# Patient Record
Sex: Female | Born: 1990 | Hispanic: Yes | Marital: Married | Smoking: Never smoker
Health system: Southern US, Community
[De-identification: ages and names within clinical notes are randomized; demographics above are authoritative.]

## PROBLEM LIST (undated history)

## (undated) ENCOUNTER — Inpatient Hospital Stay (HOSPITAL_COMMUNITY): Payer: Self-pay

## (undated) DIAGNOSIS — N83209 Unspecified ovarian cyst, unspecified side: Secondary | ICD-10-CM

## (undated) DIAGNOSIS — N200 Calculus of kidney: Secondary | ICD-10-CM

---
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
## 2010-07-13 LAB — URINE MICROSCOPIC-ADD ON

## 2010-07-13 LAB — URINALYSIS, ROUTINE W REFLEX MICROSCOPIC
Glucose, UA: NEGATIVE mg/dL
Protein, ur: NEGATIVE mg/dL
pH: 5.5 (ref 5.0–8.0)

## 2010-07-13 LAB — CBC
Hemoglobin: 12.3 g/dL (ref 12.0–15.0)
MCHC: 31.6 g/dL (ref 30.0–36.0)
RBC: 5.14 MIL/uL — ABNORMAL HIGH (ref 3.87–5.11)
WBC: 7.6 10*3/uL (ref 4.0–10.5)

## 2010-07-13 LAB — GC/CHLAMYDIA PROBE AMP, GENITAL: GC Probe Amp, Genital: NEGATIVE

## 2010-07-13 LAB — POCT PREGNANCY, URINE: Preg Test, Ur: NEGATIVE

## 2010-07-13 LAB — WET PREP, GENITAL: Clue Cells Wet Prep HPF POC: NONE SEEN

## 2010-11-25 ENCOUNTER — Emergency Department (HOSPITAL_COMMUNITY)
Admission: EM | Admit: 2010-11-25 | Discharge: 2010-11-26 | Disposition: A | Discharge status: Home / Self Care | Payer: Self-pay | Attending: Emergency Medicine | Admitting: Emergency Medicine

## 2010-11-25 DIAGNOSIS — R109 Unspecified abdominal pain: Secondary | ICD-10-CM | POA: Insufficient documentation

## 2010-11-25 DIAGNOSIS — R10814 Left lower quadrant abdominal tenderness: Secondary | ICD-10-CM | POA: Insufficient documentation

## 2010-11-25 DIAGNOSIS — N949 Unspecified condition associated with female genital organs and menstrual cycle: Secondary | ICD-10-CM | POA: Insufficient documentation

## 2010-11-25 LAB — URINALYSIS, ROUTINE W REFLEX MICROSCOPIC
Bilirubin Urine: NEGATIVE
Ketones, ur: NEGATIVE mg/dL
Nitrite: NEGATIVE
Protein, ur: NEGATIVE mg/dL
Specific Gravity, Urine: 1.027 (ref 1.005–1.030)
Urobilinogen, UA: 0.2 mg/dL (ref 0.0–1.0)

## 2010-11-26 ENCOUNTER — Emergency Department (HOSPITAL_COMMUNITY): Payer: Self-pay

## 2010-11-26 LAB — PREGNANCY, URINE: Preg Test, Ur: NEGATIVE

## 2010-11-26 LAB — DIFFERENTIAL
Basophils Absolute: 0 10*3/uL (ref 0.0–0.1)
Basophils Relative: 0 % (ref 0–1)
Eosinophils Absolute: 0.4 10*3/uL (ref 0.0–0.7)
Eosinophils Relative: 4 % (ref 0–5)
Monocytes Absolute: 0.7 10*3/uL (ref 0.1–1.0)
Monocytes Relative: 7 % (ref 3–12)

## 2010-11-26 LAB — COMPREHENSIVE METABOLIC PANEL
ALT: 15 U/L (ref 0–35)
AST: 19 U/L (ref 0–37)
Calcium: 9.3 mg/dL (ref 8.4–10.5)
Creatinine, Ser: 0.62 mg/dL (ref 0.50–1.10)
GFR calc Af Amer: >60 mL/min (ref >60)
GFR calc non Af Amer: >60 mL/min (ref >60)
Glucose, Bld: 84 mg/dL (ref 70–99)
Sodium: 139 mEq/L (ref 135–145)
Total Protein: 8 g/dL (ref 6.0–8.3)

## 2010-11-26 LAB — CBC
MCH: 27.3 pg (ref 26.0–34.0)
MCHC: 34 g/dL (ref 30.0–36.0)
RDW: 14.6 % (ref 11.5–15.5)

## 2010-11-26 LAB — WET PREP, GENITAL
Clue Cells Wet Prep HPF POC: NONE SEEN
Trich, Wet Prep: NONE SEEN
Yeast Wet Prep HPF POC: NONE SEEN

## 2010-11-27 ENCOUNTER — Emergency Department (HOSPITAL_COMMUNITY)
Admission: EM | Admit: 2010-11-27 | Discharge: 2010-11-27 | Disposition: A | Discharge status: Home / Self Care | Payer: Self-pay | Attending: Emergency Medicine | Admitting: Emergency Medicine

## 2010-11-27 DIAGNOSIS — R110 Nausea: Secondary | ICD-10-CM | POA: Insufficient documentation

## 2010-11-27 DIAGNOSIS — R51 Headache: Secondary | ICD-10-CM | POA: Insufficient documentation

## 2010-11-27 DIAGNOSIS — R42 Dizziness and giddiness: Secondary | ICD-10-CM | POA: Insufficient documentation

## 2010-11-27 DIAGNOSIS — H53149 Visual discomfort, unspecified: Secondary | ICD-10-CM | POA: Insufficient documentation

## 2010-11-28 LAB — GC/CHLAMYDIA PROBE AMP, GENITAL: Chlamydia, DNA Probe: NEGATIVE

## 2011-04-27 ENCOUNTER — Encounter: Payer: Self-pay | Admitting: Emergency Medicine

## 2011-04-27 ENCOUNTER — Emergency Department (HOSPITAL_COMMUNITY)
Admission: EM | Admit: 2011-04-27 | Discharge: 2011-04-28 | Discharge status: Against Medical Advice | Payer: Self-pay | Attending: Emergency Medicine | Admitting: Emergency Medicine

## 2011-04-27 DIAGNOSIS — Z0389 Encounter for observation for other suspected diseases and conditions ruled out: Secondary | ICD-10-CM | POA: Insufficient documentation

## 2011-04-27 NOTE — ED Notes (Signed)
Pt states she had vaginal bleeding from 03/25/11-04/25/11.  Pt has implant x 3 years.

## 2011-04-27 NOTE — ED Notes (Signed)
C/o headache and generalized weakness x 3 days.  Also c/o pain across upper chest and back.  Denies cough.

## 2011-05-07 ENCOUNTER — Other Ambulatory Visit: Payer: Self-pay | Admitting: Family Medicine

## 2011-05-07 DIAGNOSIS — N63 Unspecified lump in unspecified breast: Secondary | ICD-10-CM

## 2011-05-09 ENCOUNTER — Inpatient Hospital Stay: Admission: RE | Admit: 2011-05-09 | Payer: Self-pay | Source: Ambulatory Visit

## 2011-09-19 ENCOUNTER — Emergency Department (HOSPITAL_COMMUNITY)
Admission: EM | Admit: 2011-09-19 | Discharge: 2011-09-19 | Disposition: A | Discharge status: Home / Self Care | Payer: Self-pay | Attending: Emergency Medicine | Admitting: Emergency Medicine

## 2011-09-19 ENCOUNTER — Encounter (HOSPITAL_COMMUNITY): Payer: Self-pay | Admitting: *Deleted

## 2011-09-19 DIAGNOSIS — G43909 Migraine, unspecified, not intractable, without status migrainosus: Secondary | ICD-10-CM | POA: Insufficient documentation

## 2011-09-19 MED ORDER — PROCHLORPERAZINE EDISYLATE 5 MG/ML IJ SOLN
10.0000 mg | Freq: Once | INTRAMUSCULAR | Status: AC
  Administered 2011-09-19: 10 mg via INTRAVENOUS
  Filled 2011-09-19: qty 2

## 2011-09-19 MED ORDER — KETOROLAC TROMETHAMINE 30 MG/ML IJ SOLN
30.0000 mg | Freq: Once | INTRAMUSCULAR | Status: AC
  Administered 2011-09-19: 30 mg via INTRAVENOUS
  Filled 2011-09-19: qty 1

## 2011-09-19 MED ORDER — DIPHENHYDRAMINE HCL 50 MG/ML IJ SOLN
25.0000 mg | Freq: Once | INTRAMUSCULAR | Status: AC
  Administered 2011-09-19: 25 mg via INTRAVENOUS
  Filled 2011-09-19: qty 1

## 2011-09-19 MED ORDER — DEXAMETHASONE SODIUM PHOSPHATE 10 MG/ML IJ SOLN
10.0000 mg | Freq: Once | INTRAMUSCULAR | Status: AC
  Administered 2011-09-19: 10 mg via INTRAVENOUS
  Filled 2011-09-19: qty 1

## 2011-09-19 MED ORDER — SODIUM CHLORIDE 0.9 % IV BOLUS (SEPSIS)
1000.0000 mL | Freq: Once | INTRAVENOUS | Status: AC
  Administered 2011-09-19: 1000 mL via INTRAVENOUS

## 2011-09-19 NOTE — ED Provider Notes (Signed)
History     CSN: [id number]  Arrival date & time 09/19/11  4098   First MD Initiated Contact with Patient 09/19/11 2142      Chief Complaint  Patient presents with  . Migraine    (Consider location/radiation/quality/duration/timing/severity/associated sxs/prior treatment) HPI Comments: Hx of daily migraines for years.  Headache today similar to prior.  Gradual onset of symptoms.  Has had prior negative neuroimaging.  No PCP.  Patient is a 21 y.o. female presenting with migraine. The history is provided by the patient.  Migraine This is a recurrent problem. The current episode started today. The problem occurs constantly. The problem has been gradually worsening. Pertinent negatives include no abdominal pain, chest pain, congestion, fever or rash. Exacerbated by: photophobia. Treatments tried: motrin.    Past Medical History  Diagnosis Date  . Migraine     No past surgical history on file.  No family history on file.  History  Substance Use Topics  . Smoking status: Never Smoker   . Smokeless tobacco: Not on file  . Alcohol Use: No    OB History    Grav Para Term Preterm Abortions TAB SAB Ect Mult Living                  Review of Systems  Constitutional: Negative for fever and activity change.  HENT: Negative for congestion.   Eyes: Negative for visual disturbance.  Respiratory: Negative for chest tightness and shortness of breath.   Cardiovascular: Negative for chest pain and leg swelling.  Gastrointestinal: Negative for abdominal pain.  Genitourinary: Negative for dysuria.  Skin: Negative for rash.  Neurological: Negative for syncope.  Psychiatric/Behavioral: Negative for behavioral problems.    Allergies  Review of patient's allergies indicates no known allergies.  Home Medications   Current Outpatient Rx  Name Route Sig Dispense Refill  . ADVIL MIGRAINE PO Oral Take 2 tablets by mouth 3 (three) times daily as needed. For pain      BP 98/63   Pulse 84  Temp(Src) 97.1 F (36.2 C) (Oral)  Resp 15  SpO2 97%  Physical Exam  Constitutional: She is oriented to person, place, and time. She appears well-developed and well-nourished.  HENT:  Head: Normocephalic and atraumatic.  Eyes: Conjunctivae and EOM are normal. Pupils are equal, round, and reactive to light. No scleral icterus.  Neck: Normal range of motion. Neck supple.  Cardiovascular: Normal rate and regular rhythm.  Exam reveals no gallop and no friction rub.   No murmur heard. Pulmonary/Chest: Effort normal and breath sounds normal. No respiratory distress. She has no wheezes. She has no rales. She exhibits no tenderness.  Abdominal: Soft. She exhibits no distension and no mass. There is no tenderness. There is no rebound and no guarding.  Musculoskeletal: Normal range of motion.  Neurological: She is alert and oriented to person, place, and time. No cranial nerve deficit. She exhibits normal muscle tone. Coordination normal.       5/5 strength in all extremities.  Skin: Skin is warm and dry. No rash noted.  Psychiatric: She has a normal mood and affect. Her behavior is normal. Judgment and thought content normal.    ED Course  Procedures (including critical care time)  Labs Reviewed - No data to display No results found.   1. Migraine       MDM  Hx of daily migraines for years.  Headache today similar to prior.  Gradual onset of symptoms.  Has had prior negative neuroimaging.  No PCP.  VSS.  Normal neuro exam.  Feeling much better after migraine cocktail.  Gave neuro f/u for chronic migraines.  Pt comfortable with plan and will follow up.         Army Chaco, MD 09/19/11 2243

## 2011-09-19 NOTE — ED Notes (Signed)
Pt has hx of migraines.  For last 2 weeks she has been experiencing headaches and today her headache became unbearable.  Pt is experiencing blurry vision, photophobic.

## 2011-09-19 NOTE — ED Notes (Signed)
Pt c/o migraine h/a over past 2 wks. Pt states she has been getting a migraine almost every day. Pt having inc sensitivity to light. Pt c/o nausea but denies vomiting. Pt in room with lights off and ice pack on forehead. Pt a&o x 4 with no neuro deficits.

## 2011-09-19 NOTE — ED Notes (Signed)
Pt d/c home in NAD. Pt voiced understanding of d/c instructions and follow up care. Pt ambulated with quick, steady gait and voiced appreciation to staff.

## 2011-09-19 NOTE — ED Notes (Signed)
Ice bag given per pt. request 

## 2011-09-19 NOTE — ED Notes (Addendum)
Pt up to desk inquiring about wait times; states h/a is "unbearable"; pt tearful, interm pacing floor - apologized for wait times; family remains at pt's side

## 2011-09-19 NOTE — Discharge Instructions (Signed)

## 2011-09-20 NOTE — ED Provider Notes (Signed)
I saw and evaluated the patient, reviewed the resident's note and I agree with the findings and plan.  Gradual onset headache similar to previous migraines. No focal deficits.   Glynn Octave, MD 09/20/11 [phone]

## 2011-11-09 DIAGNOSIS — G43009 Migraine without aura, not intractable, without status migrainosus: Secondary | ICD-10-CM | POA: Insufficient documentation

## 2011-11-09 DIAGNOSIS — G43711 Chronic migraine without aura, intractable, with status migrainosus: Secondary | ICD-10-CM | POA: Insufficient documentation

## 2012-01-30 ENCOUNTER — Encounter (HOSPITAL_COMMUNITY): Payer: Self-pay | Admitting: Emergency Medicine

## 2012-01-30 ENCOUNTER — Emergency Department (HOSPITAL_COMMUNITY)
Admission: EM | Admit: 2012-01-30 | Discharge: 2012-01-30 | Disposition: A | Discharge status: Home / Self Care | Payer: Self-pay | Attending: Emergency Medicine | Admitting: Emergency Medicine

## 2012-01-30 DIAGNOSIS — G43909 Migraine, unspecified, not intractable, without status migrainosus: Secondary | ICD-10-CM

## 2012-01-30 DIAGNOSIS — N644 Mastodynia: Secondary | ICD-10-CM

## 2012-01-30 DIAGNOSIS — R209 Unspecified disturbances of skin sensation: Secondary | ICD-10-CM | POA: Insufficient documentation

## 2012-01-30 MED ORDER — DIPHENHYDRAMINE HCL 25 MG PO CAPS
25.0000 mg | ORAL_CAPSULE | Freq: Once | ORAL | Status: AC
  Administered 2012-01-30: 25 mg via ORAL
  Filled 2012-01-30: qty 1

## 2012-01-30 MED ORDER — NAPROXEN 375 MG PO TABS: 375.0000 mg | ORAL_TABLET | Freq: Two times a day (BID) | ORAL | Status: DC

## 2012-01-30 MED ORDER — METOCLOPRAMIDE HCL 5 MG/ML IJ SOLN
10.0000 mg | Freq: Once | INTRAMUSCULAR | Status: AC
  Administered 2012-01-30: 10 mg via INTRAMUSCULAR
  Filled 2012-01-30: qty 2

## 2012-01-30 MED ORDER — KETOROLAC TROMETHAMINE 60 MG/2ML IM SOLN
60.0000 mg | Freq: Once | INTRAMUSCULAR | Status: AC
  Administered 2012-01-30: 60 mg via INTRAMUSCULAR
  Filled 2012-01-30: qty 2

## 2012-01-30 MED ORDER — DEXAMETHASONE SODIUM PHOSPHATE 4 MG/ML IJ SOLN
4.0000 mg | Freq: Once | INTRAMUSCULAR | Status: AC
  Administered 2012-01-30: 4 mg via INTRAMUSCULAR
  Filled 2012-01-30: qty 1

## 2012-01-30 NOTE — ED Provider Notes (Signed)
Medical screening examination/treatment/procedure(s) were performed by non-physician practitioner and as supervising physician I was immediately available for consultation/collaboration.   Tyren Dugar, MD 01/30/12 2311 

## 2012-01-30 NOTE — ED Notes (Signed)
Onset headache 3 months ago seen Doctor and was given medication however pain continued and today pain is 8/10 sharp answering and following commands appropriate. History of right breast lump at age of 21 years old and pain 8/10 stabbing right breast

## 2012-01-30 NOTE — ED Provider Notes (Signed)
History     CSN: [id number]  Arrival date & time 01/30/12  1554   First MD Initiated Contact with Patient 01/30/12 1647      Chief Complaint  Patient presents with  . Headache  . Breast Mass    (Consider location/radiation/quality/duration/timing/severity/associated sxs/prior treatment) Patient is a 21 y.o. female presenting with headaches. The history is provided by the patient.  Headache  This is a chronic problem. The current episode started more than 1 week ago. The problem occurs constantly. The problem has been gradually worsening. The headache is associated with bright light and loud noise. The pain is located in the right unilateral region. The quality of the pain is described as sharp. The pain is at a severity of 8/10. The pain does not radiate. Associated symptoms include chest pressure and nausea. Pertinent negatives include no fever, no malaise/fatigue, no near-syncope, no shortness of breath and no vomiting.  Pt states she has seen a neurologist for these headaches, when they started about 3 years ago, states they have her on a medications, does not remember the name, states she takes it every day, today no improvement. States also has a tender nodule in right breast, states nodule has been there for 7 years, was told it was a cyst. States in the last 4 days it became tender, states pain radiating into right arm, pain with movement of the right arm. Denies fever, chills, malaise.   Past Medical History  Diagnosis Date  . Migraine     History reviewed. No pertinent past surgical history.  No family history on file.  History  Substance Use Topics  . Smoking status: Never Smoker   . Smokeless tobacco: Not on file  . Alcohol Use: No    OB History    Grav Para Term Preterm Abortions TAB SAB Ect Mult Living                  Review of Systems  Constitutional: Negative for fever, chills and malaise/fatigue.  HENT: Negative for neck pain and neck stiffness.   Eyes:  Positive for photophobia. Negative for pain.  Respiratory: Negative for shortness of breath.   Cardiovascular: Negative for near-syncope.  Gastrointestinal: Positive for nausea. Negative for vomiting.  Genitourinary: Negative for dysuria.  Musculoskeletal: Negative for myalgias.  Skin: Negative.   Neurological: Positive for headaches. Negative for weakness and numbness.    Allergies  Review of patient's allergies indicates no known allergies.  Home Medications  No current outpatient prescriptions on file.  BP 122/68  Pulse 92  Temp 98.4 F (36.9 C) (Oral)  Resp 16  SpO2 98%  Physical Exam  Nursing note and vitals reviewed. Constitutional: She is oriented to person, place, and time. She appears well-developed and well-nourished. No distress.  HENT:  Head: Normocephalic.  Right Ear: External ear normal.  Left Ear: External ear normal.  Nose: Nose normal.  Mouth/Throat: Oropharynx is clear and moist.  Eyes: Conjunctivae normal and EOM are normal. Pupils are equal, round, and reactive to light.  Neck: Normal range of motion. Neck supple.  Cardiovascular: Normal rate, regular rhythm and normal heart sounds.   Pulmonary/Chest: Effort normal and breath sounds normal. No respiratory distress. She has no wheezes. She has no rales.  Abdominal: Soft. Bowel sounds are normal. She exhibits no distension. There is no tenderness. There is no rebound.  Genitourinary:       Tenderness to the right upper breast, i do not feel a swelling, induration, nodule, mass. Pain  with ROM of the right arm at shoulder joint especially with forward flexion against resistance. No axillary lymphadenopathy  Musculoskeletal: She exhibits no edema.  Neurological: She is alert and oriented to person, place, and time.  Skin: Skin is warm and dry.    ED Course  Procedures (including critical care time)  Pt with migraine and right chest nodule? And pain for 8 years. I do not feel a nodule. Explained to pt that  it could be a cyst, that may have gotten infected, but i do not see evidence of one. Skin over breast is normal, i do not feel a nodule. No lymphadenopathy. I suspect possible muscle soreness. Will start on anti inflammatories. Told pt that if it is worsening, she may need an [place], and she is to follow up with her doctor and breast center. Headache is typical for her migraine, resolved with toradol, reglan, benadryl in ED. Neurovascularly intact, will d/c home.   1. Migraine headache   2. Breast tenderness       MDM          Lottie Mussel, PA 01/30/12 1906

## 2012-02-26 ENCOUNTER — Encounter (HOSPITAL_COMMUNITY): Payer: Self-pay | Admitting: *Deleted

## 2012-02-26 ENCOUNTER — Emergency Department (HOSPITAL_COMMUNITY)
Admission: EM | Admit: 2012-02-26 | Discharge: 2012-02-27 | Disposition: A | Discharge status: Home / Self Care | Payer: Medicaid Other | Attending: Emergency Medicine | Admitting: Emergency Medicine

## 2012-02-26 DIAGNOSIS — G43909 Migraine, unspecified, not intractable, without status migrainosus: Secondary | ICD-10-CM | POA: Insufficient documentation

## 2012-02-26 DIAGNOSIS — R1013 Epigastric pain: Secondary | ICD-10-CM | POA: Insufficient documentation

## 2012-02-26 DIAGNOSIS — R112 Nausea with vomiting, unspecified: Secondary | ICD-10-CM | POA: Insufficient documentation

## 2012-02-26 LAB — POCT I-STAT TROPONIN I: Troponin i, poc: 0.01 ng/mL (ref 0.00–0.08)

## 2012-02-26 LAB — CBC WITH DIFFERENTIAL/PLATELET
Basophils Absolute: 0 10*3/uL (ref 0.0–0.1)
Basophils Relative: 0 % (ref 0–1)
Eosinophils Absolute: 0.3 10*3/uL (ref 0.0–0.7)
Eosinophils Relative: 2 % (ref 0–5)
HCT: 39.5 % (ref 36.0–46.0)
Hemoglobin: 13.9 g/dL (ref 12.0–15.0)
MCH: 30.4 pg (ref 26.0–34.0)
MCHC: 35.2 g/dL (ref 30.0–36.0)
Monocytes Absolute: 0.8 10*3/uL (ref 0.1–1.0)
Monocytes Relative: 6 % (ref 3–12)
Neutro Abs: 8.8 10*3/uL — ABNORMAL HIGH (ref 1.7–7.7)
RDW: 12.8 % (ref 11.5–15.5)

## 2012-02-26 LAB — COMPREHENSIVE METABOLIC PANEL
AST: 16 U/L (ref 0–37)
Albumin: 3.7 g/dL (ref 3.5–5.2)
BUN: 10 mg/dL (ref 6–23)
Calcium: 9.2 mg/dL (ref 8.4–10.5)
Creatinine, Ser: 0.6 mg/dL (ref 0.50–1.10)
Total Bilirubin: 0.1 mg/dL — ABNORMAL LOW (ref 0.3–1.2)
Total Protein: 7.3 g/dL (ref 6.0–8.3)

## 2012-02-26 LAB — LIPASE, BLOOD: Lipase: 26 U/L (ref 11–59)

## 2012-02-26 MED ORDER — GI COCKTAIL ~~LOC~~
30.0000 mL | Freq: Once | ORAL | Status: AC
  Administered 2012-02-26: 30 mL via ORAL
  Filled 2012-02-26: qty 30

## 2012-02-26 NOTE — ED Notes (Signed)
Pt states that she has been having abdominal pain in the epigastric region for the past 3 days. Pt states that she started vomiting blood today so she came in, pt not actively vomiting in triage. Pt denies problems with bowel movements or urine.

## 2012-02-26 NOTE — ED Provider Notes (Signed)
History     CSN: [id number]  Arrival date & time 02/26/12  1858   First MD Initiated Contact with Patient 02/26/12 2301      Chief Complaint  Patient presents with  . Abdominal Pain    (Consider location/radiation/quality/duration/timing/severity/associated sxs/prior treatment) HPI Comments: 22 year old female who's had epigastric pain for the last 3-4 days, is intermittent, worse after she sits and associated with nausea and vomiting after she eats. She has continued to make urine which she says is yellow but not getting darker, she has no dysuria, diarrhea, constipation, blood in her stools. This pain does not radiate, it is an achy and burning pain, she has not had this in the past and has no history of abdominal surgery and denies being pregnant. Currently her symptoms are mild  Patient is a 21 y.o. female presenting with abdominal pain. The history is provided by the patient.  Abdominal Pain The primary symptoms of the illness include abdominal pain.    Past Medical History  Diagnosis Date  . Migraine     History reviewed. No pertinent past surgical history.  History reviewed. No pertinent family history.  History  Substance Use Topics  . Smoking status: Never Smoker   . Smokeless tobacco: Not on file  . Alcohol Use: No    OB History    Grav Para Term Preterm Abortions TAB SAB Ect Mult Living                  Review of Systems  Gastrointestinal: Positive for abdominal pain.  All other systems reviewed and are negative.    Allergies  Review of patient's allergies indicates no known allergies.  Home Medications   Current Outpatient Rx  Name  Route  Sig  Dispense  Refill  . PROPRANOLOL HCL ER 80 MG PO CP24   Oral   Take 80 mg by mouth daily.         Marland Kitchen FAMOTIDINE 20 MG PO TABS   Oral   Take 1 tablet (20 mg total) by mouth 2 (two) times daily.   30 tablet   0     BP 117/72  Pulse 79  Temp 99 F (37.2 C) (Oral)  Resp 16  SpO2 100%  Physical  Exam  Nursing note and vitals reviewed. Constitutional: She appears well-developed and well-nourished. No distress.  HENT:  Head: Normocephalic and atraumatic.  Mouth/Throat: Oropharynx is clear and moist. No oropharyngeal exudate.  Eyes: Conjunctivae normal and EOM are normal. Pupils are equal, round, and reactive to light. Right eye exhibits no discharge. Left eye exhibits no discharge. No scleral icterus.  Neck: Normal range of motion. Neck supple. No JVD present. No thyromegaly present.  Cardiovascular: Normal rate, regular rhythm, normal heart sounds and intact distal pulses.  Exam reveals no gallop and no friction rub.   No murmur heard. Pulmonary/Chest: Effort normal and breath sounds normal. No respiratory distress. She has no wheezes. She has no rales.  Abdominal: Soft. Bowel sounds are normal. She exhibits no distension and no mass. There is tenderness ( epigastric ttp, no guarding, no masses, no peritoneal signs, no pain in the right upper quadrant, no pain at the right lower quadrant.).  Musculoskeletal: Normal range of motion. She exhibits no edema and no tenderness.  Lymphadenopathy:    She has no cervical adenopathy.  Neurological: She is alert. Coordination normal.  Skin: Skin is warm and dry. No rash noted. No erythema.  Psychiatric: She has a normal mood and affect. Her  behavior is normal.    ED Course  Procedures (including critical care time)  Labs Reviewed  CBC WITH DIFFERENTIAL - Abnormal; Notable for the following:    WBC 12.4 (*)     Neutro Abs 8.8 (*)     All other components within normal limits  COMPREHENSIVE METABOLIC PANEL - Abnormal; Notable for the following:    Total Bilirubin 0.1 (*)     All other components within normal limits  POCT I-STAT TROPONIN I  LIPASE, BLOOD  URINALYSIS, ROUTINE W REFLEX MICROSCOPIC   No results found.   1. Epigastric pain       MDM  Overall the patient is well-appearing with normal vital signs of focal epigastric  pain. Liver function tests are normal, renal function is normal, slight leukocytosis of 12,400, troponin is drawn by nurses in triage is negative though her symptoms and exam are more consistent with a GI etiology either gastritis or pancreatitis, lipase pending. GI cocktail  ED ECG REPORT  I personally interpreted this EKG   Date: 02/27/2012   Rate: 79  Rhythm: normal sinus rhythm  QRS Axis: normal  Intervals: normal  ST/T Wave abnormalities: normal  Conduction Disutrbances:none  Narrative Interpretation:   Old EKG Reviewed: none available  Patient is well-appearing, had improvement with GI cocktail, symptoms returned, given Pepcid and Protonix, followup with local family doctors or return should symptoms worsen. Patient expresses understanding.        Vida Roller, MD 02/27/12 [phone]

## 2012-02-26 NOTE — ED Notes (Signed)
Pt in c/o epigastric abd pain x3 days, pain is increased after eating, pt states today she noted vomiting after eating, once episode of vomit with bright red blood noted, pt c/o abd pain at this time, no distress noted.

## 2012-02-27 MED ORDER — PANTOPRAZOLE SODIUM 40 MG PO TBEC
40.0000 mg | DELAYED_RELEASE_TABLET | Freq: Once | ORAL | Status: AC
  Administered 2012-02-27: 40 mg via ORAL
  Filled 2012-02-27: qty 1

## 2012-02-27 MED ORDER — FAMOTIDINE 20 MG PO TABS
20.0000 mg | ORAL_TABLET | Freq: Once | ORAL | Status: AC
  Administered 2012-02-27: 20 mg via ORAL
  Filled 2012-02-27: qty 1

## 2012-02-27 MED ORDER — FAMOTIDINE 20 MG PO TABS: 20.0000 mg | ORAL_TABLET | Freq: Two times a day (BID) | ORAL | Status: DC

## 2012-03-05 ENCOUNTER — Encounter (HOSPITAL_COMMUNITY): Payer: Self-pay | Admitting: *Deleted

## 2012-03-05 ENCOUNTER — Inpatient Hospital Stay (HOSPITAL_COMMUNITY): Payer: Medicaid Other

## 2012-03-05 ENCOUNTER — Inpatient Hospital Stay (HOSPITAL_COMMUNITY)
Admission: AD | Admit: 2012-03-05 | Discharge: 2012-03-05 | Disposition: A | Discharge status: Home / Self Care | Payer: Medicaid Other | Source: Ambulatory Visit | Attending: Obstetrics and Gynecology | Admitting: Obstetrics and Gynecology

## 2012-03-05 DIAGNOSIS — O26859 Spotting complicating pregnancy, unspecified trimester: Secondary | ICD-10-CM | POA: Insufficient documentation

## 2012-03-05 DIAGNOSIS — R109 Unspecified abdominal pain: Secondary | ICD-10-CM | POA: Insufficient documentation

## 2012-03-05 LAB — WET PREP, GENITAL
Clue Cells Wet Prep HPF POC: NONE SEEN
Trich, Wet Prep: NONE SEEN

## 2012-03-05 LAB — URINALYSIS, ROUTINE W REFLEX MICROSCOPIC
Bilirubin Urine: NEGATIVE
Ketones, ur: NEGATIVE mg/dL
Nitrite: NEGATIVE
Protein, ur: NEGATIVE mg/dL
pH: 6 (ref 5.0–8.0)

## 2012-03-05 NOTE — MAU Provider Note (Signed)
History     CSN: [id number]  Arrival date and time: 03/05/12 1214   First Provider Initiated Contact with Patient 03/05/12 1309      Chief Complaint  Patient presents with  . Abdominal Cramping  . Vaginal Bleeding   HPI 21 y.o. G2P1001 at Unknown EGA with cramping and bleeding yesterday, cramping and spotting today, pain is mostly left sided.    Past Medical History  Diagnosis Date  . Migraine     Past Surgical History  Procedure Date  . No past surgeries     Family History  Problem Relation Age of Onset  . Other Neg Hx     History  Substance Use Topics  . Smoking status: Never Smoker   . Smokeless tobacco: Not on file  . Alcohol Use: No    Allergies: No Known Allergies  Prescriptions prior to admission  Medication Sig Dispense Refill  . acetaminophen (TYLENOL) 325 MG tablet Take 650 mg by mouth every 6 (six) hours as needed. pain      . famotidine (PEPCID) 20 MG tablet Take 1 tablet (20 mg total) by mouth 2 (two) times daily.  30 tablet  0    Review of Systems  Constitutional: Negative.   Respiratory: Negative.   Cardiovascular: Negative.   Gastrointestinal: Positive for abdominal pain. Negative for nausea, vomiting, diarrhea and constipation.  Genitourinary: Negative for dysuria, urgency, frequency, hematuria and flank pain.       Positive for spotting   Musculoskeletal: Negative.   Neurological: Negative.   Psychiatric/Behavioral: Negative.    Physical Exam   Blood pressure 121/63, pulse 89, temperature 98.3 F (36.8 C), temperature source Oral, resp. rate 16, height 5\' 2"  (1.575 m), weight 150 lb (68.04 kg), last menstrual period 09/23/2011.  Physical Exam  Nursing note and vitals reviewed. Constitutional: She is oriented to person, place, and time. She appears well-developed and well-nourished. No distress.  HENT:  Head: Normocephalic and atraumatic.  Cardiovascular: Normal rate and regular rhythm.   Respiratory: Effort normal. No  respiratory distress.  GI: Soft. She exhibits no distension and no mass. There is no tenderness. There is no rebound and no guarding.  Genitourinary: There is no rash or lesion on the right labia. There is no rash or lesion on the left labia. Uterus is not deviated, not enlarged, not fixed and not tender. Cervix exhibits no motion tenderness, no discharge and no friability. Right adnexum displays no mass, no tenderness and no fullness. Left adnexum displays no mass, no tenderness and no fullness. No erythema, tenderness or bleeding around the vagina. No vaginal discharge found.  Neurological: She is alert and oriented to person, place, and time.  Skin: Skin is warm and dry.  Psychiatric: She has a normal mood and affect.    MAU Course  Procedures Results for orders placed during the hospital encounter of 03/05/12 (from the past 24 hour(s))  URINALYSIS, ROUTINE W REFLEX MICROSCOPIC     Status: Normal   Collection Time   03/05/12 12:20 PM      Component Value Range   Color, Urine YELLOW  YELLOW   APPearance CLEAR  CLEAR   Specific Gravity, Urine 1.020  1.005 - 1.030   pH 6.0  5.0 - 8.0   Glucose, UA NEGATIVE  NEGATIVE mg/dL   Hgb urine dipstick NEGATIVE  NEGATIVE   Bilirubin Urine NEGATIVE  NEGATIVE   Ketones, ur NEGATIVE  NEGATIVE mg/dL   Protein, ur NEGATIVE  NEGATIVE mg/dL   Urobilinogen, UA 0.2  0.0 - 1.0 mg/dL   Nitrite NEGATIVE  NEGATIVE   Leukocytes, UA NEGATIVE  NEGATIVE  POCT PREGNANCY, URINE     Status: Abnormal   Collection Time   03/05/12 12:30 PM      Component Value Range   Preg Test, Ur POSITIVE (*) NEGATIVE   U/S: 8.0 week IUP, + FHR, no adnexal masses, no subchorionic hemorrhage    Assessment and Plan   1. Spotting in pregnancy       Medication List     As of 03/05/2012  3:27 PM    CONTINUE taking these medications         acetaminophen 325 MG tablet   Commonly known as: TYLENOL      famotidine 20 MG tablet   Commonly known as: PEPCID   Take 1  tablet (20 mg total) by mouth 2 (two) times daily.            Follow-up Information    Follow up with [facility] [place]. (start prenatal care as soon as possible)    Contact information:   [address] [place] [address] [phone]           Elva Mauro 03/05/2012, 1:10 PM

## 2012-03-05 NOTE — MAU Note (Signed)
Had Implanon removed in March and was on Depo until June; No birth control since June; has not had a period so she is unsure about when she got pregnant;

## 2012-03-05 NOTE — MAU Note (Signed)
C/o cramping & spotting today; c/o cramping and bleeding yesterday; ? 8-[redacted] weeks gestation;

## 2012-03-05 NOTE — MAU Note (Signed)
Unable to doppler FHR 

## 2012-03-06 LAB — GC/CHLAMYDIA PROBE AMP, GENITAL
Chlamydia, DNA Probe: NEGATIVE
GC Probe Amp, Genital: NEGATIVE

## 2012-03-06 NOTE — MAU Provider Note (Signed)
Attestation of Attending Supervision of Advanced Practitioner (CNM/NP): Evaluation and management procedures were performed by the Advanced Practitioner under my supervision and collaboration.  I have reviewed the Advanced Practitioner's note and chart, and I agree with the management and plan.  Yael Angerer 03/06/2012 2:14 PM

## 2012-03-22 ENCOUNTER — Encounter (HOSPITAL_COMMUNITY): Payer: Self-pay

## 2012-03-22 ENCOUNTER — Inpatient Hospital Stay (HOSPITAL_COMMUNITY)
Admission: AD | Admit: 2012-03-22 | Discharge: 2012-03-22 | Disposition: A | Discharge status: Home / Self Care | Payer: Medicaid Other | Source: Ambulatory Visit | Attending: Obstetrics and Gynecology | Admitting: Obstetrics and Gynecology

## 2012-03-22 DIAGNOSIS — O26899 Other specified pregnancy related conditions, unspecified trimester: Secondary | ICD-10-CM

## 2012-03-22 DIAGNOSIS — R1032 Left lower quadrant pain: Secondary | ICD-10-CM | POA: Insufficient documentation

## 2012-03-22 DIAGNOSIS — O210 Mild hyperemesis gravidarum: Secondary | ICD-10-CM | POA: Insufficient documentation

## 2012-03-22 DIAGNOSIS — A5901 Trichomonal vulvovaginitis: Secondary | ICD-10-CM | POA: Insufficient documentation

## 2012-03-22 DIAGNOSIS — G43009 Migraine without aura, not intractable, without status migrainosus: Secondary | ICD-10-CM

## 2012-03-22 DIAGNOSIS — O98819 Other maternal infectious and parasitic diseases complicating pregnancy, unspecified trimester: Secondary | ICD-10-CM | POA: Insufficient documentation

## 2012-03-22 LAB — URINALYSIS, ROUTINE W REFLEX MICROSCOPIC
Bilirubin Urine: NEGATIVE
Ketones, ur: NEGATIVE mg/dL
Nitrite: NEGATIVE
Specific Gravity, Urine: 1.025 (ref 1.005–1.030)
Urobilinogen, UA: 0.2 mg/dL (ref 0.0–1.0)

## 2012-03-22 MED ORDER — BUTALBITAL-APAP-CAFFEINE 50-325-40 MG PO TABS: 1.0000 | ORAL_TABLET | Freq: Four times a day (QID) | ORAL | Status: DC | PRN

## 2012-03-22 MED ORDER — METRONIDAZOLE 500 MG PO TABS
2000.0000 mg | ORAL_TABLET | Freq: Once | ORAL | Status: AC
  Administered 2012-03-22: 2000 mg via ORAL
  Filled 2012-03-22: qty 4

## 2012-03-22 MED ORDER — PROMETHAZINE HCL 25 MG PO TABS: 25.0000 mg | ORAL_TABLET | Freq: Four times a day (QID) | ORAL | Status: DC | PRN

## 2012-03-22 MED ORDER — BUTALBITAL-APAP-CAFFEINE 50-325-40 MG PO TABS
2.0000 | ORAL_TABLET | Freq: Once | ORAL | Status: AC
  Administered 2012-03-22: 2 via ORAL
  Filled 2012-03-22: qty 2

## 2012-03-22 MED ORDER — ONDANSETRON HCL 4 MG PO TABS
8.0000 mg | ORAL_TABLET | Freq: Once | ORAL | Status: AC
  Administered 2012-03-22: 8 mg via ORAL
  Filled 2012-03-22: qty 2

## 2012-03-22 NOTE — MAU Note (Signed)
Pt states pain began 4 days ago. Pain is moreso on pt's left lower abdomen, painful with movement, and walking, and sitting upright. Rates pain 9/10. Denies bleeding or abnormal vaginal discharge.

## 2012-03-22 NOTE — MAU Note (Signed)
Pt reports she had been bleeding 2 days ago now it has stopped but now is having LRQ pain  Hard to walk with the pain.

## 2012-03-22 NOTE — MAU Provider Note (Signed)
Chief Complaint: Abdominal Pain  First Provider Initiated Contact with Patient 03/22/12 1952     SUBJECTIVE HPI: Sharon Garner is a 21 y.o. G2P1001 at [redacted]w[redacted]d by LMP who presents with: 1. LLQ pain x 4 days, worse w/ walking and position changes. Rates pain 9/10. 2. Daily migraines. Same as prior to pregnancy. Frontal, constant, throbbing, L>R. No aura. Partially-controlled (~wwekly HA's) w/ propranolol Rx'ed by provider in [place], but instructed to stop when Dx w/ pregnancy.  3. Morning sickness.  4. Spotting, no change since prior MAU visit.   Denies urinary complaints, GI complaints, URI Rx, Neck pain, fever, chills, vaginal discharge or passage of tissue. GC/CT neg two weeks ago. Declines retesting.   Past Medical History  Diagnosis Date  . Migraine    OB History    Grav Para Term Preterm Abortions TAB SAB Ect Mult Living   2 1 1       1      # Outc Date GA Lbr Len/2nd Wgt Sex Del Anes PTL Lv   1 TRM            2 CUR              Past Surgical History  Procedure Date  . No past surgeries    History   Social History  . Marital Status: Married    Spouse Name: N/A    Number of Children: N/A  . Years of Education: N/A   Occupational History  . Not on file.   Social History Main Topics  . Smoking status: Never Smoker   . Smokeless tobacco: Never Used  . Alcohol Use: No  . Drug Use: No  . Sexually Active: Yes    Birth Control/ Protection: None   Other Topics Concern  . Not on file   Social History Narrative  . No narrative on file   No current facility-administered medications on file prior to encounter.   Current Outpatient Prescriptions on File Prior to Encounter  Medication Sig Dispense Refill  . acetaminophen (TYLENOL) 325 MG tablet Take 650 mg by mouth every 6 (six) hours as needed. pain       No Known Allergies  ROS: Pertinent items in HPI  OBJECTIVE Blood pressure 114/62, pulse 83, temperature 98 F (36.7 C), temperature source Oral, resp. rate  18, height 5\' 2"  (1.575 m), weight 66.134 kg (145 lb 12.8 oz), last menstrual period 09/23/2011. GENERAL: Well-developed, well-nourished female in no acute distress.  HEENT: Normocephalic. PERRLA. Sinuses NT. No congestion.  HEART: normal rate RESP: normal effort ABDOMEN: Soft, mild LLQ tenderness.  EXTREMITIES: Nontender, no edema NEURO: Alert and oriented SPECULUM EXAM: NEFG, small amount of malodorous yellow discharge, no blood noted, cervix clean BIMANUAL: cervix long and closed; uterus 10-week size, no adnexal tenderness or masses FHR: 156 per doppler  LAB RESULTS Results for orders placed during the hospital encounter of 03/22/12 (from the past 24 hour(s))  URINALYSIS, ROUTINE W REFLEX MICROSCOPIC     Status: Abnormal   Collection Time   03/22/12  7:12 PM      Component Value Range   Color, Urine YELLOW  YELLOW   APPearance CLEAR  CLEAR   Specific Gravity, Urine 1.025  1.005 - 1.030   pH 6.0  5.0 - 8.0   Glucose, UA NEGATIVE  NEGATIVE mg/dL   Hgb urine dipstick SMALL (*) NEGATIVE   Bilirubin Urine NEGATIVE  NEGATIVE   Ketones, ur NEGATIVE  NEGATIVE mg/dL   Protein, ur NEGATIVE  NEGATIVE  mg/dL   Urobilinogen, UA 0.2  0.0 - 1.0 mg/dL   Nitrite NEGATIVE  NEGATIVE   Leukocytes, UA SMALL (*) NEGATIVE  URINE MICROSCOPIC-ADD ON     Status: Abnormal   Collection Time   03/22/12  7:12 PM      Component Value Range   Squamous Epithelial / LPF FEW (*) RARE   WBC, UA 3-6  <3 WBC/hpf   RBC / HPF 3-6  <3 RBC/hpf   Bacteria, UA FEW (*) RARE   Urine-Other TRICHOMONAS PRESENT      IMAGING  MAU COURSE Explained poor sensitivity of Wet prep to Dx Ivery Quale. Cannot be sure when pt contracted it. Flagyl 2 gm, Zofran and Fioricet given.  HA  Much better, 3/10. LLQ pain resolved.    ASSESSMENT 1. Migraine without aura   2. Trichomonal vaginitis in pregnancy   3. Abdominal pain complicating pregnancy, antepartum    PLAN Discharge home Partner needs Tx. No IC until 1 week after both pt  and Partner Tx. Urine culture pending. May need referral to HA specialist.  Discussed comfort measures, rebound HA's, avoiding triggers including dehydration.  Do not take > 4gm Tylenol per day. Follow-up Information    [facility] with [facility].      Follow up with [facility] [place] [facility] [place] [facility]. (As needed if symptoms worsen)    Contact information:   [address] [id number] mc [place] [address] [phone]           Medication List     As of 03/23/2012  8:55 PM    TAKE these medications         acetaminophen 325 MG tablet   Commonly known as: TYLENOL   Take 650 mg by mouth every 6 (six) hours as needed. pain      butalbital-acetaminophen-caffeine 50-325-40 MG per tablet   Commonly known as: FIORICET, ESGIC   Take 1-2 tablets by mouth every 6 (six) hours as needed for headache.      promethazine 25 MG tablet   Commonly known as: PHENERGAN   Take 1 tablet (25 mg total) by mouth every 6 (six) hours as needed for nausea.        [place], CNM 03/22/2012  7:45 PM

## 2012-03-24 LAB — URINE CULTURE: Colony Count: 75000

## 2012-03-24 NOTE — MAU Provider Note (Signed)
Attestation of Attending Supervision of Advanced Practitioner (CNM/NP): Evaluation and management procedures were performed by the Advanced Practitioner under my supervision and collaboration.  I have reviewed the Advanced Practitioner's note and chart, and I agree with the management and plan.  Jalyn Rosero 03/24/2012 12:34 PM

## 2012-04-01 LAB — OB RESULTS CONSOLE ABO/RH: RH Type: POSITIVE

## 2012-04-01 LAB — OB RESULTS CONSOLE RPR: RPR: NONREACTIVE

## 2012-04-01 LAB — OB RESULTS CONSOLE HEPATITIS B SURFACE ANTIGEN: Hepatitis B Surface Ag: NEGATIVE

## 2012-04-01 LAB — OB RESULTS CONSOLE RUBELLA ANTIBODY, IGM: Rubella: IMMUNE

## 2012-04-01 LAB — OB RESULTS CONSOLE HIV ANTIBODY (ROUTINE TESTING): HIV: NONREACTIVE

## 2012-04-02 ENCOUNTER — Other Ambulatory Visit (HOSPITAL_COMMUNITY): Payer: Self-pay | Admitting: Physician Assistant

## 2012-04-02 DIAGNOSIS — Z3682 Encounter for antenatal screening for nuchal translucency: Secondary | ICD-10-CM

## 2012-04-07 ENCOUNTER — Ambulatory Visit (HOSPITAL_COMMUNITY): Payer: Self-pay

## 2012-04-09 ENCOUNTER — Ambulatory Visit (HOSPITAL_COMMUNITY)
Admission: RE | Admit: 2012-04-09 | Discharge: 2012-04-09 | Disposition: A | Discharge status: Home / Self Care | Payer: Medicaid Other | Source: Ambulatory Visit | Attending: Physician Assistant | Admitting: Physician Assistant

## 2012-04-09 ENCOUNTER — Other Ambulatory Visit (HOSPITAL_COMMUNITY): Payer: Self-pay | Admitting: Physician Assistant

## 2012-04-09 DIAGNOSIS — Z3682 Encounter for antenatal screening for nuchal translucency: Secondary | ICD-10-CM

## 2012-04-09 DIAGNOSIS — O351XX0 Maternal care for (suspected) chromosomal abnormality in fetus, not applicable or unspecified: Secondary | ICD-10-CM | POA: Insufficient documentation

## 2012-04-09 DIAGNOSIS — Z3689 Encounter for other specified antenatal screening: Secondary | ICD-10-CM | POA: Insufficient documentation

## 2012-04-09 DIAGNOSIS — O3510X0 Maternal care for (suspected) chromosomal abnormality in fetus, unspecified, not applicable or unspecified: Secondary | ICD-10-CM | POA: Insufficient documentation

## 2012-05-06 ENCOUNTER — Other Ambulatory Visit: Payer: Self-pay | Admitting: Family Medicine

## 2012-05-06 DIAGNOSIS — Z3689 Encounter for other specified antenatal screening: Secondary | ICD-10-CM

## 2012-05-09 ENCOUNTER — Encounter (HOSPITAL_COMMUNITY): Payer: Self-pay | Admitting: *Deleted

## 2012-05-09 ENCOUNTER — Inpatient Hospital Stay (HOSPITAL_COMMUNITY)
Admission: AD | Admit: 2012-05-09 | Discharge: 2012-05-09 | Disposition: A | Discharge status: Home / Self Care | Payer: Medicaid Other | Source: Ambulatory Visit | Attending: Obstetrics and Gynecology | Admitting: Obstetrics and Gynecology

## 2012-05-09 DIAGNOSIS — G43909 Migraine, unspecified, not intractable, without status migrainosus: Secondary | ICD-10-CM

## 2012-05-09 DIAGNOSIS — O99891 Other specified diseases and conditions complicating pregnancy: Secondary | ICD-10-CM | POA: Insufficient documentation

## 2012-05-09 DIAGNOSIS — O26899 Other specified pregnancy related conditions, unspecified trimester: Secondary | ICD-10-CM

## 2012-05-09 DIAGNOSIS — R1031 Right lower quadrant pain: Secondary | ICD-10-CM | POA: Insufficient documentation

## 2012-05-09 LAB — URINALYSIS, ROUTINE W REFLEX MICROSCOPIC
Bilirubin Urine: NEGATIVE
Glucose, UA: NEGATIVE mg/dL
Hgb urine dipstick: NEGATIVE
Ketones, ur: NEGATIVE mg/dL
Protein, ur: NEGATIVE mg/dL

## 2012-05-09 LAB — CBC
HCT: 36.5 % (ref 36.0–46.0)
MCHC: 34.2 g/dL (ref 30.0–36.0)
MCV: 88.4 fL (ref 78.0–100.0)
RDW: 13.7 % (ref 11.5–15.5)

## 2012-05-09 MED ORDER — PROMETHAZINE HCL 25 MG/ML IJ SOLN
12.5000 mg | Freq: Once | INTRAMUSCULAR | Status: AC
  Administered 2012-05-09: 12.5 mg via INTRAMUSCULAR
  Filled 2012-05-09: qty 1

## 2012-05-09 MED ORDER — NALBUPHINE HCL 10 MG/ML IJ SOLN
10.0000 mg | Freq: Once | INTRAMUSCULAR | Status: AC
  Administered 2012-05-09: 10 mg via INTRAMUSCULAR
  Filled 2012-05-09: qty 1

## 2012-05-09 NOTE — MAU Note (Signed)
Patient states she has no appetite and eats only one meal a day. Discussed importance of eating small frequent snacks and drinking fluids throughout the day.

## 2012-05-09 NOTE — MAU Provider Note (Signed)
  History     CSN: [id number]  Arrival date and time: 05/09/12 4098   First Provider Initiated Contact with Patient 05/09/12 [phone]      Chief Complaint  Patient presents with  . Headache  . Abdominal Pain   HPI  Sharon Garner is a .22 y.o. G2P1001 at [redacted]w[redacted]d who has had a migraine since last Friday. She has been taking Fioricet and it has not provided relief. She states she has a history of migraine headaches and can usually get them under control on her own. She is also having RLQ pain, and points to the area of the RL. She also expressed concern about not feeling the baby move much at this time.   Past Medical History  Diagnosis Date  . Migraine     Past Surgical History  Procedure Date  . No past surgeries     Family History  Problem Relation Age of Onset  . Other Neg Hx     History  Substance Use Topics  . Smoking status: Never Smoker   . Smokeless tobacco: Never Used  . Alcohol Use: No    Allergies: No Known Allergies  Prescriptions prior to admission  Medication Sig Dispense Refill  . acetaminophen (TYLENOL) 325 MG tablet Take 650 mg by mouth every 6 (six) hours as needed. pain      . butalbital-acetaminophen-caffeine (FIORICET) 50-325-40 MG per tablet Take 1-2 tablets by mouth every 6 (six) hours as needed for headache.  30 tablet  1  . promethazine (PHENERGAN) 25 MG tablet Take 1 tablet (25 mg total) by mouth every 6 (six) hours as needed for nausea.  30 tablet  1    Review of Systems  Constitutional: Negative for fever and chills.  Eyes: Negative for blurred vision and double vision.  Gastrointestinal: Positive for abdominal pain (RLQ pain). Negative for nausea, vomiting, diarrhea and constipation.  Genitourinary: Negative for dysuria, urgency and frequency.  Neurological: Positive for headaches. Negative for dizziness and tingling.   Physical Exam   Blood pressure 110/71, pulse 97, temperature 99 F (37.2 C), temperature source Oral, resp. rate  16, height 5\' 2"  (1.575 m), weight 142 lb (64.411 kg), last menstrual period 09/23/2011, SpO2 100.00%.  Physical Exam  Nursing note and vitals reviewed. Constitutional: She is oriented to person, place, and time. She appears well-developed and well-nourished. No distress.  Cardiovascular: Normal rate.   Respiratory: Effort normal.  GI: Soft. She exhibits no distension. There is no tenderness. There is no rebound.  Neurological: She is alert and oriented to person, place, and time.  Skin: Skin is warm and dry.  Psychiatric: She has a normal mood and affect.    MAU Course  Procedures  0745: Pt states she is starting to feel better. The headache is not completely gone. She is tolerating some crackers.   Assessment and Plan   1. Migraine   2. Pain of round ligament complicating pregnancy, antepartum    Reassured about fetal movement at this time Continue Fioricet as needed Migraine comfort measures discussed  Tawnya Crook 05/09/2012, 6:28 AM

## 2012-05-09 NOTE — MAU Note (Signed)
Pt reports she has been having "really bad headaches for the last week", states  The right side of her head feels numb. Pt reports history of migraines. Has been taking Fioricet but not relieving the pain.also reports rt lower abd for last 24 hours, this pain comes and goes.

## 2012-05-12 NOTE — MAU Provider Note (Signed)
Attestation of Attending Supervision of Advanced Practitioner (CNM/NP): Evaluation and management procedures were performed by the Advanced Practitioner under my supervision and collaboration.  I have reviewed the Advanced Practitioner's note and chart, and I agree with the management and plan.  Claudell Rhody 05/12/2012 2:22 PM

## 2012-05-13 ENCOUNTER — Encounter (HOSPITAL_COMMUNITY): Payer: Self-pay

## 2012-05-13 ENCOUNTER — Inpatient Hospital Stay (HOSPITAL_COMMUNITY)
Admission: AD | Admit: 2012-05-13 | Discharge: 2012-05-14 | Disposition: A | Discharge status: Home / Self Care | Payer: Medicaid Other | Source: Ambulatory Visit | Attending: Obstetrics and Gynecology | Admitting: Obstetrics and Gynecology

## 2012-05-13 DIAGNOSIS — O99891 Other specified diseases and conditions complicating pregnancy: Secondary | ICD-10-CM | POA: Insufficient documentation

## 2012-05-13 DIAGNOSIS — G43909 Migraine, unspecified, not intractable, without status migrainosus: Secondary | ICD-10-CM

## 2012-05-13 DIAGNOSIS — R51 Headache: Secondary | ICD-10-CM | POA: Insufficient documentation

## 2012-05-13 DIAGNOSIS — R1031 Right lower quadrant pain: Secondary | ICD-10-CM

## 2012-05-13 DIAGNOSIS — O26899 Other specified pregnancy related conditions, unspecified trimester: Secondary | ICD-10-CM

## 2012-05-13 MED ORDER — LACTATED RINGERS IV SOLN
Freq: Once | INTRAVENOUS | Status: AC
  Administered 2012-05-13: 500 mL/h via INTRAVENOUS

## 2012-05-13 MED ORDER — METOCLOPRAMIDE HCL 5 MG/ML IJ SOLN
10.0000 mg | Freq: Once | INTRAMUSCULAR | Status: AC
  Administered 2012-05-13: 10 mg via INTRAVENOUS
  Filled 2012-05-13: qty 2

## 2012-05-13 MED ORDER — DEXAMETHASONE SODIUM PHOSPHATE 10 MG/ML IJ SOLN
10.0000 mg | Freq: Once | INTRAMUSCULAR | Status: AC
  Administered 2012-05-13: 10 mg via INTRAVENOUS
  Filled 2012-05-13: qty 1

## 2012-05-13 MED ORDER — DIPHENHYDRAMINE HCL 50 MG/ML IJ SOLN
25.0000 mg | Freq: Once | INTRAMUSCULAR | Status: AC
  Administered 2012-05-13: 25 mg via INTRAVENOUS
  Filled 2012-05-13: qty 1

## 2012-05-13 NOTE — MAU Provider Note (Signed)
  History     CSN: [id number]  Arrival date and time: 05/13/12 2143   First Provider Initiated Contact with Patient 05/13/12 2242      Chief Complaint  Patient presents with  . Headache   HPI Sharon Garner 22 y.o. [redacted]w[redacted]d  [facility]  Has a history of migraines and has had a headache for a week.  For the past 2 days it is 10/10.  Medications she has been taking has not helped.  OB History    Grav Para Term Preterm Abortions TAB SAB Ect Mult Living   2 1 1       1       Past Medical History  Diagnosis Date  . Migraine     Past Surgical History  Procedure Date  . No past surgeries     Family History  Problem Relation Age of Onset  . Other Neg Hx     History  Substance Use Topics  . Smoking status: Never Smoker   . Smokeless tobacco: Never Used  . Alcohol Use: No    Allergies: No Known Allergies  Prescriptions prior to admission  Medication Sig Dispense Refill  . acetaminophen (TYLENOL) 325 MG tablet Take 650 mg by mouth every 6 (six) hours as needed. pain      . butalbital-acetaminophen-caffeine (FIORICET) 50-325-40 MG per tablet Take 1-2 tablets by mouth every 6 (six) hours as needed for headache.  30 tablet  1    Review of Systems  Constitutional: Negative for fever.  Gastrointestinal: Positive for nausea. Negative for vomiting and abdominal pain.  Genitourinary: Negative for dysuria.  Neurological: Positive for headaches.   Physical Exam   Blood pressure 108/56, pulse 89, temperature 98.7 F (37.1 C), temperature source Oral, resp. rate 16, height 5\' 2"  (1.575 m), weight 64.864 kg (143 lb), last menstrual period 09/23/2011, SpO2 99.00%.  Physical Exam  Nursing note and vitals reviewed. Constitutional: She is oriented to person, place, and time. She appears well-developed and well-nourished.       Lying in bed in a darkened room.  Does not sit up.  HENT:  Head: Normocephalic.  Eyes: EOM are normal.  Neck:  Neck supple.  Musculoskeletal: Normal range of motion.  Neurological: She is alert and oriented to person, place, and time.  Skin: Skin is warm and dry.  Psychiatric: She has a normal mood and affect.    MAU Course  Procedures IV of LR 1000cc and Decadron 10 mg, Reglan 10 mg and Benadryl 25 mg IV.  Headache has improved from 10/10 to 4/10.  Advised to stop any Fioricet.  May take Tylenol and will prescribe Flexeril to try for headaches.  [facility]  MDM   Assessment and Plan  Headache in pregnancy  Plan IVF given with meds and headache has improved. Rx Flexeril 10 mg PO tid as needed for headache. May take Tylenol by the package directions for headache.  BURLESON,TERRI 05/13/2012, 10:46 PM

## 2012-05-13 NOTE — MAU Note (Signed)
Pt reports she was seen a week ago for headache and was given meds but the headache never went away and now it is worsening.

## 2012-05-14 MED ORDER — CYCLOBENZAPRINE HCL 10 MG PO TABS: 10.0000 mg | ORAL_TABLET | Freq: Three times a day (TID) | ORAL | Status: DC | PRN

## 2012-05-14 NOTE — MAU Provider Note (Signed)
Attestation of Attending Supervision of Advanced Practitioner (CNM/NP): Evaluation and management procedures were performed by the Advanced Practitioner under my supervision and collaboration.  I have reviewed the Advanced Practitioner's note and chart, and I agree with the management and plan.  Valary Manahan 05/14/2012 8:04 AM

## 2012-05-15 ENCOUNTER — Ambulatory Visit (HOSPITAL_COMMUNITY)
Admission: RE | Admit: 2012-05-15 | Discharge: 2012-05-15 | Disposition: A | Discharge status: Home / Self Care | Payer: Self-pay | Source: Ambulatory Visit | Attending: Family Medicine | Admitting: Family Medicine

## 2012-05-16 DIAGNOSIS — Z810 Family history of intellectual disabilities: Secondary | ICD-10-CM

## 2012-05-16 HISTORY — DX: Family history of intellectual disabilities: Z81.0

## 2012-05-19 ENCOUNTER — Ambulatory Visit (HOSPITAL_COMMUNITY)
Admission: RE | Admit: 2012-05-19 | Discharge: 2012-05-19 | Disposition: A | Discharge status: Home / Self Care | Payer: Medicaid Other | Source: Ambulatory Visit | Attending: Family Medicine | Admitting: Family Medicine

## 2012-05-19 DIAGNOSIS — O358XX0 Maternal care for other (suspected) fetal abnormality and damage, not applicable or unspecified: Secondary | ICD-10-CM | POA: Insufficient documentation

## 2012-05-19 DIAGNOSIS — Z1389 Encounter for screening for other disorder: Secondary | ICD-10-CM | POA: Insufficient documentation

## 2012-05-19 DIAGNOSIS — Z3689 Encounter for other specified antenatal screening: Secondary | ICD-10-CM

## 2012-05-19 DIAGNOSIS — Z363 Encounter for antenatal screening for malformations: Secondary | ICD-10-CM | POA: Insufficient documentation

## 2012-05-19 NOTE — Progress Notes (Signed)
Genetic Counseling  High-Risk Gestation Note  Appointment Date:  05/15/2012 Referred By: Reva Bores, MD Date of Birth:  11-Aug-1990    Pregnancy History: G2P1001 Estimated Date of Delivery: 10/15/12 Estimated Gestational Age: [redacted]w[redacted]d Attending: Alpha Gula, MD   Ms. Rashaunda Vergara-Cerros and her husband, Mr. Orvan Falconer, were seen for genetic counseling because of family history of mental retardation.  Both family histories were reviewed and found to be contributory for mental retardation for the patient's older sister. The patient reported that her sister is 40 years old and has the "mind of a 22 year old." She is able to walk, but she does not speak. One half of her brain reportedly did not develop well. The patient reported that she has no physical differences from relatives. Ms. Hassan reported that her mother stated that her sister was developing normally until they were in a car accident when her sister was 41 years old. She reportedly began having delays following the accident. She lives in a group home in [place]. The patient reported three additional full sisters, three full brothers, and a maternal half-brother and half-sister who are all reportedly healthy.   We discussed that there are many different causes of mental retardation such as genetic differences, sporadic causes, or injuries.  A specific diagnosis for mental retardation can be determined in approximately 50% of cases.  In the remaining 50% of cases, a diagnosis may not ever be determined.  Without a known cause for the condition it is difficult to assess the recurrence risk. We discussed that if the mental delays were the result of an environmental cause, such as injury following a car accident, then recurrence risk would not be expected to be increased for other relatives. However, we discussed that underlying genetic or multifactorial etiologies for mental retardation may not necessarily present with signs prenatally  or at birth. A genetics evaluation for the affected individual would be most informative in order to accurately assess the risk for other family members.  We can review medical records on this family member if desired.  Ms. Lenetta Piche may contact us if additional information is obtained regarding her sister's mental delays.   We discussed that in the absence of an identified genetic cause, prenatal screening or testing for mental retardation would not be available in the current pregnancy. We discussed that prenatal ultrasound would not be able to assess for mental delays. We discussed the option of carrier screening for Fragile X syndrome, which is a common type of inherited mental retardation syndrome and is more common in males.  Carrier screening is used to help identify those individuals that may be at increased risk to have affected children, although the information it can provide is limited or questionable in some instances and has the potential to reveal personal predispositions for some health problems in carriers that some people may or may not want to know about themselves.  After careful consideration, Ms. Rayah Vergara-Cerros declined carrier testing for Fragile X syndrome at this time.  Additionally, Ms. Datha Vergara-Cerros reported a female maternal first cousin with a congenital hole in his heart, requiring multiple surgeries. He is currently 22 years old and otherwise healthy. The patient also reported a cousin through her maternal grandfather's family (unsure of exact degree of relation) also with a hole in the heart. This individual is currently 22 years old, and he is reportedly otherwise healthy. Congenital heart defects occur in approximately 1% of pregnancies.  Congenital heart defects may occur due to  multifactorial influences, chromosomal abnormalities, genetic syndromes or environmental exposures.  Isolated heart defects are generally multifactorial.  Given the reported  family history and assuming multifactorial inheritance for these individuals, the risk for a congenital heart defect in the current pregnancy does not appear to be increased above the general population risk. However, in the case of a different underlying cause, recurrence risk estimate may change. We discussed that targeted ultrasound is available to assess fetal growth and anatomy during pregnancy. However, ultrasound cannot diagnose or rule out all birth defects or genetic conditions.   Ms. Bayyinah Dukeman also reported a history of cancer for her mother, diagnosed at approximately 22 years old. She was unsure of the specific type of cancer but reported that her mother was treated with a hysterectomy. Though most cancers are thought to be sporadic or due to environmental factors, some families appear to have a strong predisposition to cancers.  When considering a family history of cancer, we look for common types of cancer in multiple family members occurring at younger than typical ages. we discussed the option of meeting with a cancer genetic counselor to discuss any possible screening or testing options available. If they are concerned about the family history of cancer and would like to learn more about the family's chance for an inherited cancer syndrome, her doctor may refer her or her relatives to [facility] [phone]). Without further information regarding the provided family history, an accurate genetic risk cannot be calculated. Further genetic counseling is warranted if more information is obtained.   Ms. Agnieszka Newhouse was provided with written information regarding cystic fibrosis (CF) including the carrier frequency and incidence in the Hispanic population, the availability of carrier testing and prenatal diagnosis if indicated.  In addition, we discussed that CF is routinely screened for as part of the  newborn screening panel.  She previously had  CF carrier screening, which was negative for the mutations analyzed. Thus, her risk to be a carrier is reduced but not eliminated.   Ms. Kitiara Hintze denied exposure to environmental toxins or chemical agents. She denied the use of alcohol, tobacco or street drugs. She denied significant viral illnesses during the course of her pregnancy. Her medical and surgical histories were contributory for headaches during the pregnancy, for which she has been prescribed flexeril.   I counseled this couple for approximately 35 minutes regarding the above risks and available options.     Quinn Plowman, MS,  Certified Genetic Counselor 05/19/2012

## 2012-08-19 ENCOUNTER — Inpatient Hospital Stay (HOSPITAL_COMMUNITY)
Admission: AD | Admit: 2012-08-19 | Discharge: 2012-08-19 | Disposition: A | Discharge status: Home / Self Care | Payer: Medicaid Other | Source: Ambulatory Visit | Attending: Obstetrics & Gynecology | Admitting: Obstetrics & Gynecology

## 2012-08-19 ENCOUNTER — Encounter (HOSPITAL_COMMUNITY): Payer: Self-pay | Admitting: *Deleted

## 2012-08-19 DIAGNOSIS — R109 Unspecified abdominal pain: Secondary | ICD-10-CM | POA: Insufficient documentation

## 2012-08-19 DIAGNOSIS — O99891 Other specified diseases and conditions complicating pregnancy: Secondary | ICD-10-CM | POA: Insufficient documentation

## 2012-08-19 DIAGNOSIS — O26899 Other specified pregnancy related conditions, unspecified trimester: Secondary | ICD-10-CM

## 2012-08-19 DIAGNOSIS — N949 Unspecified condition associated with female genital organs and menstrual cycle: Secondary | ICD-10-CM | POA: Insufficient documentation

## 2012-08-19 LAB — URINALYSIS, ROUTINE W REFLEX MICROSCOPIC
Bilirubin Urine: NEGATIVE
Hgb urine dipstick: NEGATIVE
Protein, ur: NEGATIVE mg/dL
Urobilinogen, UA: 0.2 mg/dL (ref 0.0–1.0)

## 2012-08-19 LAB — WET PREP, GENITAL

## 2012-08-19 LAB — URINE MICROSCOPIC-ADD ON

## 2012-08-19 MED ORDER — ACETAMINOPHEN 325 MG PO TABS: 650.0000 mg | ORAL_TABLET | Freq: Once | ORAL | Status: DC

## 2012-08-19 NOTE — MAU Note (Signed)
Patient states she has been having lower abdominal pain and pressure for about 2 weeks. Denies bleeding or leaking and does not feel like this is contractions. Reports good fetal movement.

## 2012-08-19 NOTE — MAU Provider Note (Signed)
History     CSN: [id number]  Arrival date and time: 08/19/12 1154   None     Chief Complaint  Patient presents with  . Abdominal Pain   HPI  Sharon Garner is a 22 y.o. G2P1001 at [redacted]w[redacted]d who presents complaining about pelvic pain. It has gone on for about two weeks and comes every day, but is getting worse now. She has sharp pain and pressure. It is worse when the baby moves.   Has had some vaginal discharge and is unsure about fluid leaking. She is not feeling any contractions. The baby is moving well. No vaginal bleeding.   [facility]. Denies having any problems during this pregnancy.  Meds: prenatal vitamins   OB History   Grav Para Term Preterm Abortions TAB SAB Ect Mult Living   2 1 1       1       Past Medical History  Diagnosis Date  . Migraine     Past Surgical History  Procedure Laterality Date  . No past surgeries      Family History  Problem Relation Age of Onset  . Other Neg Hx   . Alcohol abuse Neg Hx   . Arthritis Neg Hx   . Asthma Neg Hx   . Birth defects Neg Hx   . Cancer Neg Hx   . COPD Neg Hx   . Depression Neg Hx   . Diabetes Neg Hx   . Drug abuse Neg Hx   . Early death Neg Hx   . Hearing loss Neg Hx   . Heart disease Neg Hx   . Hyperlipidemia Neg Hx   . Hypertension Neg Hx   . Kidney disease Neg Hx   . Learning disabilities Neg Hx   . Mental illness Neg Hx   . Mental retardation Neg Hx   . Miscarriages / Stillbirths Neg Hx   . Stroke Neg Hx   . Vision loss Neg Hx     History  Substance Use Topics  . Smoking status: Never Smoker   . Smokeless tobacco: Never Used  . Alcohol Use: No    Allergies: No Known Allergies  Prescriptions prior to admission  Medication Sig Dispense Refill  . Prenatal Vit-Fe Fumarate-FA (PRENATAL MULTIVITAMIN) TABS Take 1 tablet by mouth daily at 12 noon.        ROS + fetal movement, no contractions, + discharge, + pelvic pain Physical Exam    Blood pressure 103/55, pulse 103, temperature 98.2 F (36.8 C), temperature source Oral, resp. rate 18, height 5' 0.5" (1.537 m), weight 153 lb (69.4 kg), last menstrual period 09/23/2011, SpO2 100.00%.  Physical Exam Gen: NAD Lungs: NWOB Abd: gravid but otherwise soft, nontender to palpation Neuro: grossly nonfocal, speech intact GU: normal appearing external genitalia. White discharge present in the vagina. No pooling of clear fluid. Dilation:  (FT outer os but closed inner os) Effacement (%): Thick Station:  (high) Exam by:: Morrison Old RN  MAU Course  Procedures  Results for orders placed during the hospital encounter of 08/19/12 (from the past 24 hour(s))  URINALYSIS, ROUTINE W REFLEX MICROSCOPIC     Status: Abnormal   Collection Time    08/19/12 12:15 PM      Result Value Range   Color, Urine YELLOW  YELLOW   APPearance CLEAR  CLEAR   Specific Gravity, Urine >1.030 (*) 1.005 - 1.030   pH 6.0  5.0 - 8.0  Glucose, UA NEGATIVE  NEGATIVE mg/dL   Hgb urine dipstick NEGATIVE  NEGATIVE   Bilirubin Urine NEGATIVE  NEGATIVE   Ketones, ur NEGATIVE  NEGATIVE mg/dL   Protein, ur NEGATIVE  NEGATIVE mg/dL   Urobilinogen, UA 0.2  0.0 - 1.0 mg/dL   Nitrite NEGATIVE  NEGATIVE   Leukocytes, UA TRACE (*) NEGATIVE  URINE MICROSCOPIC-ADD ON     Status: Abnormal   Collection Time    08/19/12 12:15 PM      Result Value Range   Squamous Epithelial / LPF RARE  RARE   WBC, UA 3-6  <3 WBC/hpf   Bacteria, UA FEW (*) RARE  WET PREP, GENITAL     Status: Abnormal   Collection Time    08/19/12  1:05 PM      Result Value Range   Yeast Wet Prep HPF POC NONE SEEN  NONE SEEN   Trich, Wet Prep NONE SEEN  NONE SEEN   Clue Cells Wet Prep HPF POC NONE SEEN  NONE SEEN   WBC, Wet Prep HPF POC MODERATE (*) NONE SEEN   FHR: baseline 130s, moderate variability, + accels, no decels Toco: no ctx seen   Assessment and Plan  Sharon Garner is a 23 y.o. G2P1001 at [redacted]w[redacted]d who presents with pelvic  pain/pressure. No contractions seen on monitor. Cervix is closed. Wet prep unremarkable. Will send gc/chlamydia. Urinalysis equivocal so will send urine culture. Most likely pain is due to round ligament pain or other normal pains of pregnancy.Pt is stable for discharge at this time.    Levert Feinstein 08/19/2012, 1:47 PM   I have seen and examined this patient and I agree with the above. Cam Hai 10:19 PM 08/19/2012

## 2012-08-20 LAB — URINE CULTURE: Culture: NO GROWTH

## 2012-08-20 LAB — GC/CHLAMYDIA PROBE AMP: GC Probe RNA: NEGATIVE

## 2012-08-20 NOTE — MAU Provider Note (Signed)
Attestation of Attending Supervision of Advanced Practitioner (PA/CNM/NP): Evaluation and management procedures were performed by the Advanced Practitioner under my supervision and collaboration.  I have reviewed the Advanced Practitioner's note and chart, and I agree with the management and plan.  Makyla Bye, MD, FACOG Attending Obstetrician & Gynecologist Faculty Practice, [facility]  

## 2012-09-01 ENCOUNTER — Inpatient Hospital Stay (HOSPITAL_COMMUNITY)
Admission: AD | Admit: 2012-09-01 | Discharge: 2012-09-01 | Disposition: A | Discharge status: Home / Self Care | Payer: Medicaid Other | Source: Ambulatory Visit | Attending: Obstetrics & Gynecology | Admitting: Obstetrics & Gynecology

## 2012-09-01 ENCOUNTER — Encounter (HOSPITAL_COMMUNITY): Payer: Self-pay | Admitting: *Deleted

## 2012-09-01 DIAGNOSIS — O4700 False labor before 37 completed weeks of gestation, unspecified trimester: Secondary | ICD-10-CM | POA: Insufficient documentation

## 2012-09-01 DIAGNOSIS — O26899 Other specified pregnancy related conditions, unspecified trimester: Secondary | ICD-10-CM

## 2012-09-01 LAB — URINALYSIS, ROUTINE W REFLEX MICROSCOPIC
Glucose, UA: NEGATIVE mg/dL
Nitrite: NEGATIVE
Protein, ur: NEGATIVE mg/dL
Urobilinogen, UA: 0.2 mg/dL (ref 0.0–1.0)

## 2012-09-01 LAB — URINE MICROSCOPIC-ADD ON

## 2012-09-01 MED ORDER — ACETAMINOPHEN 325 MG PO TABS: 650.0000 mg | ORAL_TABLET | Freq: Once | ORAL | Status: DC

## 2012-09-01 NOTE — MAU Provider Note (Signed)
History     CSN: [id number]  Arrival date and time: 09/01/12 2028   None     No chief complaint on file.  HPI  Sharon Garner is a 22 y.o. G3P1011 at [redacted]w[redacted]d who presents complaining of contractions.  Pt states the ctx started on Saturday. At that time they weren't that strong, and came and went. Last night they became stronger, happening every 1-2 hours. She gets pain up at the top of her abdomen and around around her hips. She also feels a pressure in her vaginal area that is painful. She is now getting the ctx about every hour, lasting 5-10 minutes at a time when it happens.   She has had normal white discharge. No soaking of her underwear. [facility] and says one nurse told her that her cervix was 2cm open but then another said it was closed. Denies vaginal bleeding. Baby is moving well (last movement was just now).  Has been eating and drinking well. Prenatal care obtained through the [facility]. Denies having any problems during this pregnancy.   OB History   Grav Para Term Preterm Abortions TAB SAB Ect Mult Living   3 1 1  1  1   1     Normal SVD  Past Medical History  Diagnosis Date  . Migraine     Past Surgical History  Procedure Laterality Date  . No past surgeries      Family History  Problem Relation Age of Onset  . Other Neg Hx   . Alcohol abuse Neg Hx   . Arthritis Neg Hx   . Asthma Neg Hx   . Birth defects Neg Hx   . Cancer Neg Hx   . COPD Neg Hx   . Depression Neg Hx   . Diabetes Neg Hx   . Drug abuse Neg Hx   . Early death Neg Hx   . Hearing loss Neg Hx   . Heart disease Neg Hx   . Hyperlipidemia Neg Hx   . Hypertension Neg Hx   . Kidney disease Neg Hx   . Learning disabilities Neg Hx   . Mental illness Neg Hx   . Mental retardation Neg Hx   . Miscarriages / Stillbirths Neg Hx   . Stroke Neg Hx   . Vision loss Neg Hx     History  Substance Use Topics  . Smoking status: Never Smoker   .  Smokeless tobacco: Never Used  . Alcohol Use: No    Allergies: No Known Allergies  Prescriptions prior to admission  Medication Sig Dispense Refill  . Prenatal Vit-Fe Fumarate-FA (PRENATAL MULTIVITAMIN) TABS Take 1 tablet by mouth daily at 12 noon.      sometimes takes tylenol prn  ROS + ctx, + fetal movement, no LOF, no bleeding Physical Exam   Blood pressure 107/63, pulse 87, temperature 98.3 F (36.8 C), temperature source Oral, resp. rate 20, height 5\' 2"  (1.575 m), weight 155 lb 8 oz (70.534 kg), last menstrual period 09/23/2011.  Physical Exam Gen: NAD CV: radial pulse is regular rate and rhythm Lungs: NWOB Abd: gravid but otherwise soft, nontender to palpation Ext: no appreciable lower extremity edema bilaterally Neuro: grossly nonfocal, speech intact GU: normal appearing external genitalia Dilation: Closed Exam by:: DCALLAWAY, RN   MAU Course  Procedures  Results for orders placed during the hospital encounter of 09/01/12 (from the past 24 hour(s))  URINALYSIS, ROUTINE W REFLEX MICROSCOPIC  Status: Abnormal   Collection Time    09/01/12  8:38 PM      Result Value Range   Color, Urine YELLOW  YELLOW   APPearance CLEAR  CLEAR   Specific Gravity, Urine 1.010  1.005 - 1.030   pH 6.5  5.0 - 8.0   Glucose, UA NEGATIVE  NEGATIVE mg/dL   Hgb urine dipstick NEGATIVE  NEGATIVE   Bilirubin Urine NEGATIVE  NEGATIVE   Ketones, ur NEGATIVE  NEGATIVE mg/dL   Protein, ur NEGATIVE  NEGATIVE mg/dL   Urobilinogen, UA 0.2  0.0 - 1.0 mg/dL   Nitrite NEGATIVE  NEGATIVE   Leukocytes, UA TRACE (*) NEGATIVE  URINE MICROSCOPIC-ADD ON     Status: Abnormal   Collection Time    09/01/12  8:38 PM      Result Value Range   Squamous Epithelial / LPF FEW (*) RARE   WBC, UA 0-2  <3 WBC/hpf   RBC / HPF 0-2  <3 RBC/hpf   Bacteria, UA FEW (*) RARE    FHR: baseline 140s, moderate variability, + accels, no decels, FHR audibly irregular Toco: uterine irritability  present   Assessment and Plan  Sharon Garner is a 22 y.o. G3P1011 at [redacted]w[redacted]d who presents complaining of contractions. Cervix is closed on exam. No need for gc/chlamydia or wet prep as these were just done 2 weeks ago and were normal, and pt does not endorse new or worsening discharge. Unable to do fetal fibronectin as patient was checked earlier [facility] the [facility]. FHR reassurring. Stable for discharge at this time. Pt counseled on reasons to return (increased ctx, LOF, vaginal bleeding, or decreased fetal movement).  Levert Feinstein 09/01/2012, 9:44 PM   .I have seen the patient with the resident/student and agree with the above.  Tawnya Crook

## 2012-09-01 NOTE — MAU Note (Signed)
PT SAYS SHE STARTED  FEELING UC ON SAT -   BECAME  STRONG  ON Sunday NIGHT.  WAS AT [facility] TODAY-  THEY  DID VE -   ONE   DENIES HSV AND MRSA.   SAID SHE WAS 2 CM AND OTHER SAID SHE WAS CLOSED-  AND THEY  GOT FHR.    LAST SEX - MORE THAN 48 HRS.

## 2012-09-04 NOTE — MAU Provider Note (Signed)
Attestation of Attending Supervision of Advanced Practitioner (CNM/NP): Evaluation and management procedures were performed by the Advanced Practitioner under my supervision and collaboration. I have reviewed the Advanced Practitioner's note and chart, and I agree with the management and plan.  LEGGETT,KELLY H. 2:41 PM

## 2012-09-29 LAB — OB RESULTS CONSOLE GBS: GBS: NEGATIVE

## 2012-09-29 LAB — OB RESULTS CONSOLE GC/CHLAMYDIA: Gonorrhea: NEGATIVE

## 2012-10-03 ENCOUNTER — Encounter (HOSPITAL_COMMUNITY): Payer: Self-pay | Admitting: *Deleted

## 2012-10-03 ENCOUNTER — Inpatient Hospital Stay (HOSPITAL_COMMUNITY)
Admission: AD | Admit: 2012-10-03 | Discharge: 2012-10-03 | Disposition: A | Discharge status: Home / Self Care | Payer: Medicaid Other | Source: Ambulatory Visit | Attending: Obstetrics and Gynecology | Admitting: Obstetrics and Gynecology

## 2012-10-03 DIAGNOSIS — O479 False labor, unspecified: Secondary | ICD-10-CM | POA: Insufficient documentation

## 2012-10-16 ENCOUNTER — Other Ambulatory Visit (HOSPITAL_COMMUNITY): Payer: Self-pay | Admitting: Nurse Practitioner

## 2012-10-16 ENCOUNTER — Ambulatory Visit (HOSPITAL_COMMUNITY)
Admission: RE | Admit: 2012-10-16 | Discharge: 2012-10-16 | Disposition: A | Discharge status: Home / Self Care | Payer: Medicaid Other | Source: Ambulatory Visit | Attending: Nurse Practitioner | Admitting: Nurse Practitioner

## 2012-10-16 DIAGNOSIS — O480 Post-term pregnancy: Secondary | ICD-10-CM

## 2012-10-17 ENCOUNTER — Inpatient Hospital Stay (HOSPITAL_COMMUNITY): Payer: Medicaid Other | Admitting: Anesthesiology

## 2012-10-17 ENCOUNTER — Encounter (HOSPITAL_COMMUNITY)
Admission: AD | Disposition: A | Discharge status: Home / Self Care | Payer: Self-pay | Source: Ambulatory Visit | Attending: Obstetrics & Gynecology

## 2012-10-17 ENCOUNTER — Encounter (HOSPITAL_COMMUNITY): Payer: Self-pay | Admitting: Anesthesiology

## 2012-10-17 ENCOUNTER — Inpatient Hospital Stay (HOSPITAL_COMMUNITY): Payer: Medicaid Other

## 2012-10-17 ENCOUNTER — Inpatient Hospital Stay (HOSPITAL_COMMUNITY)
Admission: AD | Admit: 2012-10-17 | Discharge: 2012-10-19 | DRG: 766 | Disposition: A | Discharge status: Home / Self Care | Payer: Medicaid Other | Source: Ambulatory Visit | Attending: Obstetrics & Gynecology | Admitting: Obstetrics & Gynecology

## 2012-10-17 ENCOUNTER — Encounter (HOSPITAL_COMMUNITY): Payer: Self-pay | Admitting: *Deleted

## 2012-10-17 DIAGNOSIS — O321XX0 Maternal care for breech presentation, not applicable or unspecified: Secondary | ICD-10-CM

## 2012-10-17 LAB — CBC
HCT: 30.4 % — ABNORMAL LOW (ref 36.0–46.0)
Hemoglobin: 9.8 g/dL — ABNORMAL LOW (ref 12.0–15.0)
MCH: 23.8 pg — ABNORMAL LOW (ref 26.0–34.0)
MCHC: 32.2 g/dL (ref 30.0–36.0)
MCV: 74 fL — ABNORMAL LOW (ref 78.0–100.0)

## 2012-10-17 SURGERY — Surgical Case
Anesthesia: Spinal | Site: Abdomen | Wound class: Clean Contaminated

## 2012-10-17 MED ORDER — LACTATED RINGERS IV SOLN
INTRAVENOUS | Status: DC
  Administered 2012-10-17 (×3): via INTRAVENOUS

## 2012-10-17 MED ORDER — MEPERIDINE HCL 25 MG/ML IJ SOLN: 6.2500 mg | INTRAMUSCULAR | Status: DC | PRN

## 2012-10-17 MED ORDER — ONDANSETRON HCL 4 MG/2ML IJ SOLN: 4.0000 mg | INTRAMUSCULAR | Status: DC | PRN

## 2012-10-17 MED ORDER — NALBUPHINE HCL 10 MG/ML IJ SOLN
5.0000 mg | INTRAMUSCULAR | Status: DC | PRN
  Administered 2012-10-18: 10 mg via INTRAVENOUS
  Administered 2012-10-18 (×2): 5 mg via INTRAVENOUS
  Filled 2012-10-17 (×2): qty 1

## 2012-10-17 MED ORDER — OXYTOCIN 10 UNIT/ML IJ SOLN
40.0000 [IU] | INTRAVENOUS | Status: DC | PRN
  Administered 2012-10-17: 40 [IU] via INTRAVENOUS

## 2012-10-17 MED ORDER — METOCLOPRAMIDE HCL 5 MG/ML IJ SOLN: 10.0000 mg | Freq: Three times a day (TID) | INTRAMUSCULAR | Status: DC | PRN

## 2012-10-17 MED ORDER — TETANUS-DIPHTH-ACELL PERTUSSIS 5-2.5-18.5 LF-MCG/0.5 IM SUSP: 0.5000 mL | Freq: Once | INTRAMUSCULAR | Status: DC

## 2012-10-17 MED ORDER — LANOLIN HYDROUS EX OINT: 1.0000 "application " | TOPICAL_OINTMENT | CUTANEOUS | Status: DC | PRN

## 2012-10-17 MED ORDER — DIPHENHYDRAMINE HCL 25 MG PO CAPS: 25.0000 mg | ORAL_CAPSULE | Freq: Four times a day (QID) | ORAL | Status: DC | PRN

## 2012-10-17 MED ORDER — SIMETHICONE 80 MG PO CHEW: 80.0000 mg | CHEWABLE_TABLET | ORAL | Status: DC | PRN

## 2012-10-17 MED ORDER — METOCLOPRAMIDE HCL 5 MG/ML IJ SOLN
INTRAMUSCULAR | Status: DC | PRN
  Administered 2012-10-17 (×2): 5 mg via INTRAVENOUS

## 2012-10-17 MED ORDER — NALBUPHINE HCL 10 MG/ML IJ SOLN: 5.0000 mg | INTRAMUSCULAR | Status: DC | PRN

## 2012-10-17 MED ORDER — ONDANSETRON HCL 4 MG/2ML IJ SOLN: 4.0000 mg | Freq: Three times a day (TID) | INTRAMUSCULAR | Status: DC | PRN

## 2012-10-17 MED ORDER — KETOROLAC TROMETHAMINE 30 MG/ML IJ SOLN
INTRAMUSCULAR | Status: AC
  Administered 2012-10-17: 30 mg via INTRAVENOUS
  Filled 2012-10-17: qty 1

## 2012-10-17 MED ORDER — BUPIVACAINE IN DEXTROSE 0.75-8.25 % IT SOLN
INTRATHECAL | Status: DC | PRN
  Administered 2012-10-17: 1.4 mL via INTRATHECAL

## 2012-10-17 MED ORDER — SCOPOLAMINE 1 MG/3DAYS TD PT72
MEDICATED_PATCH | TRANSDERMAL | Status: AC
  Administered 2012-10-17: 1.5 mg via TRANSDERMAL
  Filled 2012-10-17: qty 1

## 2012-10-17 MED ORDER — SODIUM CHLORIDE 0.9 % IJ SOLN: 3.0000 mL | INTRAMUSCULAR | Status: DC | PRN

## 2012-10-17 MED ORDER — SENNOSIDES-DOCUSATE SODIUM 8.6-50 MG PO TABS
2.0000 | ORAL_TABLET | Freq: Every day | ORAL | Status: DC
  Administered 2012-10-17 – 2012-10-18 (×2): 2 via ORAL

## 2012-10-17 MED ORDER — ONDANSETRON HCL 4 MG/2ML IJ SOLN
INTRAMUSCULAR | Status: DC | PRN
  Administered 2012-10-17: 4 mg via INTRAVENOUS

## 2012-10-17 MED ORDER — DIBUCAINE 1 % RE OINT: 1.0000 "application " | TOPICAL_OINTMENT | RECTAL | Status: DC | PRN

## 2012-10-17 MED ORDER — PHENYLEPHRINE HCL 10 MG/ML IJ SOLN
INTRAMUSCULAR | Status: DC | PRN
  Administered 2012-10-17: 40 ug via INTRAVENOUS
  Administered 2012-10-17 (×4): 80 ug via INTRAVENOUS
  Administered 2012-10-17: 40 ug via INTRAVENOUS
  Administered 2012-10-17 (×2): 80 ug via INTRAVENOUS

## 2012-10-17 MED ORDER — OXYTOCIN 40 UNITS IN LACTATED RINGERS INFUSION - SIMPLE MED: 125.0000 mL/h | INTRAVENOUS | Status: AC

## 2012-10-17 MED ORDER — OXYCODONE-ACETAMINOPHEN 5-325 MG PO TABS
1.0000 | ORAL_TABLET | ORAL | Status: DC | PRN
  Administered 2012-10-18: 1 via ORAL
  Administered 2012-10-18: 2 via ORAL
  Administered 2012-10-19: 1 via ORAL
  Administered 2012-10-19: 2 via ORAL
  Filled 2012-10-17: qty 1
  Filled 2012-10-17 (×2): qty 2
  Filled 2012-10-17: qty 1

## 2012-10-17 MED ORDER — MENTHOL 3 MG MT LOZG: 1.0000 | LOZENGE | OROMUCOSAL | Status: DC | PRN

## 2012-10-17 MED ORDER — FAMOTIDINE IN NACL 20-0.9 MG/50ML-% IV SOLN
20.0000 mg | Freq: Once | INTRAVENOUS | Status: AC
  Administered 2012-10-17: 20 mg via INTRAVENOUS
  Filled 2012-10-17: qty 50

## 2012-10-17 MED ORDER — DIPHENHYDRAMINE HCL 25 MG PO CAPS
25.0000 mg | ORAL_CAPSULE | ORAL | Status: DC | PRN
  Administered 2012-10-18: 25 mg via ORAL
  Filled 2012-10-17: qty 1

## 2012-10-17 MED ORDER — ZOLPIDEM TARTRATE 5 MG PO TABS: 5.0000 mg | ORAL_TABLET | Freq: Every evening | ORAL | Status: DC | PRN

## 2012-10-17 MED ORDER — FENTANYL CITRATE 0.05 MG/ML IJ SOLN: 25.0000 ug | INTRAMUSCULAR | Status: DC | PRN

## 2012-10-17 MED ORDER — CEFAZOLIN SODIUM-DEXTROSE 2-3 GM-% IV SOLR
2.0000 g | INTRAVENOUS | Status: AC
  Administered 2012-10-17: 2 g via INTRAVENOUS
  Filled 2012-10-17: qty 50

## 2012-10-17 MED ORDER — ONDANSETRON HCL 4 MG PO TABS: 4.0000 mg | ORAL_TABLET | ORAL | Status: DC | PRN

## 2012-10-17 MED ORDER — FENTANYL CITRATE 0.05 MG/ML IJ SOLN
INTRAMUSCULAR | Status: DC | PRN
  Administered 2012-10-17: 25 ug via INTRATHECAL

## 2012-10-17 MED ORDER — CITRIC ACID-SODIUM CITRATE 334-500 MG/5ML PO SOLN
30.0000 mL | Freq: Once | ORAL | Status: AC
  Administered 2012-10-17: 30 mL via ORAL
  Filled 2012-10-17: qty 15

## 2012-10-17 MED ORDER — NALOXONE HCL 0.4 MG/ML IJ SOLN: 0.4000 mg | INTRAMUSCULAR | Status: DC | PRN

## 2012-10-17 MED ORDER — WITCH HAZEL-GLYCERIN EX PADS: 1.0000 "application " | MEDICATED_PAD | CUTANEOUS | Status: DC | PRN

## 2012-10-17 MED ORDER — DIPHENHYDRAMINE HCL 50 MG/ML IJ SOLN
12.5000 mg | INTRAMUSCULAR | Status: DC | PRN
  Administered 2012-10-17: 12.5 mg via INTRAVENOUS
  Filled 2012-10-17: qty 1

## 2012-10-17 MED ORDER — DIPHENHYDRAMINE HCL 50 MG/ML IJ SOLN: 25.0000 mg | INTRAMUSCULAR | Status: DC | PRN

## 2012-10-17 MED ORDER — IBUPROFEN 600 MG PO TABS
600.0000 mg | ORAL_TABLET | Freq: Four times a day (QID) | ORAL | Status: DC
  Administered 2012-10-18 – 2012-10-19 (×5): 600 mg via ORAL
  Filled 2012-10-17 (×5): qty 1

## 2012-10-17 MED ORDER — EPHEDRINE SULFATE 50 MG/ML IJ SOLN
INTRAMUSCULAR | Status: DC | PRN
  Administered 2012-10-17 (×3): 10 mg via INTRAVENOUS

## 2012-10-17 MED ORDER — LACTATED RINGERS IV SOLN
INTRAVENOUS | Status: DC
  Administered 2012-10-18: 08:00:00 via INTRAVENOUS

## 2012-10-17 MED ORDER — KETOROLAC TROMETHAMINE 30 MG/ML IJ SOLN: 30.0000 mg | Freq: Four times a day (QID) | INTRAMUSCULAR | Status: AC | PRN

## 2012-10-17 MED ORDER — NALOXONE HCL 1 MG/ML IJ SOLN: 1.0000 ug/kg/h | INTRAVENOUS | Status: DC | PRN

## 2012-10-17 MED ORDER — SCOPOLAMINE 1 MG/3DAYS TD PT72: 1.0000 | MEDICATED_PATCH | Freq: Once | TRANSDERMAL | Status: DC

## 2012-10-17 MED ORDER — LACTATED RINGERS IV SOLN
INTRAVENOUS | Status: DC | PRN
  Administered 2012-10-17: 17:00:00 via INTRAVENOUS

## 2012-10-17 MED ORDER — PRENATAL MULTIVITAMIN CH
1.0000 | ORAL_TABLET | Freq: Every day | ORAL | Status: DC
  Administered 2012-10-18 – 2012-10-19 (×2): 1 via ORAL
  Filled 2012-10-17 (×2): qty 1

## 2012-10-17 MED ORDER — MAGNESIUM HYDROXIDE 400 MG/5ML PO SUSP: 30.0000 mL | ORAL | Status: DC | PRN

## 2012-10-17 MED ORDER — TERBUTALINE SULFATE 1 MG/ML IJ SOLN
0.2500 mg | Freq: Once | INTRAMUSCULAR | Status: AC
  Administered 2012-10-17: 0.25 mg via SUBCUTANEOUS
  Filled 2012-10-17: qty 1

## 2012-10-17 MED ORDER — MORPHINE SULFATE (PF) 0.5 MG/ML IJ SOLN
INTRAMUSCULAR | Status: DC | PRN
  Administered 2012-10-17: .15 mg via EPIDURAL

## 2012-10-17 MED ORDER — BUPIVACAINE HCL (PF) 0.5 % IJ SOLN
INTRAMUSCULAR | Status: DC | PRN
  Administered 2012-10-17: 30 mL

## 2012-10-17 MED ORDER — KETOROLAC TROMETHAMINE 30 MG/ML IJ SOLN
30.0000 mg | Freq: Four times a day (QID) | INTRAMUSCULAR | Status: AC | PRN
  Administered 2012-10-18: 30 mg via INTRAVENOUS
  Filled 2012-10-17: qty 1

## 2012-10-17 SURGICAL SUPPLY — 35 items
BENZOIN TINCTURE PRP APPL 2/3 (GAUZE/BANDAGES/DRESSINGS) ×2 IMPLANT
CLAMP CORD UMBIL (MISCELLANEOUS) IMPLANT
CLOTH BEACON ORANGE TIMEOUT ST (SAFETY) ×2 IMPLANT
DRAPE LG THREE QUARTER DISP (DRAPES) ×2 IMPLANT
DRSG OPSITE 6X11 MED (GAUZE/BANDAGES/DRESSINGS) ×2 IMPLANT
DRSG OPSITE POSTOP 4X10 (GAUZE/BANDAGES/DRESSINGS) IMPLANT
DURAPREP 26ML APPLICATOR (WOUND CARE) ×2 IMPLANT
ELECT REM PT RETURN 9FT ADLT (ELECTROSURGICAL) ×2
ELECTRODE REM PT RTRN 9FT ADLT (ELECTROSURGICAL) ×1 IMPLANT
EXTRACTOR VACUUM M CUP 4 TUBE (SUCTIONS) IMPLANT
GLOVE BIO SURGEON STRL SZ7 (GLOVE) ×2 IMPLANT
GLOVE BIOGEL PI IND STRL 7.0 (GLOVE) ×1 IMPLANT
GLOVE BIOGEL PI INDICATOR 7.0 (GLOVE) ×1
GOWN STRL REIN XL XLG (GOWN DISPOSABLE) ×4 IMPLANT
KIT ABG SYR 3ML LUER SLIP (SYRINGE) IMPLANT
NEEDLE HYPO 22GX1.5 SAFETY (NEEDLE) ×2 IMPLANT
NEEDLE HYPO 25X5/8 SAFETYGLIDE (NEEDLE) ×2 IMPLANT
NS IRRIG 1000ML POUR BTL (IV SOLUTION) ×2 IMPLANT
PACK C SECTION WH (CUSTOM PROCEDURE TRAY) ×2 IMPLANT
PAD ABD 7.5X8 STRL (GAUZE/BANDAGES/DRESSINGS) ×2 IMPLANT
PAD OB MATERNITY 4.3X12.25 (PERSONAL CARE ITEMS) ×2 IMPLANT
RTRCTR C-SECT PINK 25CM LRG (MISCELLANEOUS) ×2 IMPLANT
STAPLER VISISTAT 35W (STAPLE) IMPLANT
STRIP CLOSURE SKIN 1/2X4 (GAUZE/BANDAGES/DRESSINGS) ×2 IMPLANT
SUT PDS AB 0 CT1 27 (SUTURE) IMPLANT
SUT PDS AB 0 CTX 36 PDP370T (SUTURE) IMPLANT
SUT VIC AB 0 CT1 36 (SUTURE) ×4 IMPLANT
SUT VIC AB 0 CTX 36 (SUTURE) ×2
SUT VIC AB 0 CTX36XBRD ANBCTRL (SUTURE) ×2 IMPLANT
SUT VIC AB 4-0 KS 27 (SUTURE) ×2 IMPLANT
SYR 30ML LL (SYRINGE) ×2 IMPLANT
TAPE CLOTH SURG 4X10 WHT LF (GAUZE/BANDAGES/DRESSINGS) ×2 IMPLANT
TOWEL OR 17X24 6PK STRL BLUE (TOWEL DISPOSABLE) ×6 IMPLANT
TRAY FOLEY CATH 14FR (SET/KITS/TRAYS/PACK) ×2 IMPLANT
WATER STERILE IRR 1000ML POUR (IV SOLUTION) IMPLANT

## 2012-10-17 NOTE — MAU Provider Note (Signed)
Please refer to H & P for admission details.  Casey Maxfield, MD, FACOG Attending Obstetrician & Gynecologist Faculty Practice, [facility]   

## 2012-10-17 NOTE — Transfer of Care (Signed)
Immediate Anesthesia Transfer of Care Note  Patient: Sharon Garner  Procedure(s) Performed: Procedure(s): CESAREAN SECTION (N/A)  Patient Location: PACU  Anesthesia Type:Spinal  Level of Consciousness: awake, alert  and oriented  Airway & Oxygen Therapy: Patient Spontanous Breathing  Post-op Assessment: Report given to PACU RN and Post -op Vital signs reviewed and stable  Post vital signs: Reviewed and stable  Complications: No apparent anesthesia complications

## 2012-10-17 NOTE — Progress Notes (Signed)
[facility] [place] to confirm presentation  FHR reassuring UCs every 4 minutes  Cervix  Dilation: 1 Effacement (%): Thick Cervical Position: Posterior Station: -3 Exam by:: Dorrene German RN; C Robinson RN  US shows frank breech presentation AFI 9cm (25%ile)  Dr. Macon Large will come to speak with pt re: Cesarean Delivery

## 2012-10-17 NOTE — Anesthesia Preprocedure Evaluation (Signed)
Anesthesia Evaluation  Patient identified by MRN, date of birth, ID band Patient awake    Reviewed: Allergy & Precautions, H&P , NPO status , Patient's Chart, lab work & pertinent test results  Airway Mallampati: II TM Distance: >3 FB Neck ROM: Full    Dental no notable dental hx. (+) Teeth Intact   Pulmonary neg pulmonary ROS,  breath sounds clear to auscultation  Pulmonary exam normal       Cardiovascular negative cardio ROS  Rhythm:Regular Rate:Normal     Neuro/Psych  Headaches, negative psych ROS   GI/Hepatic negative GI ROS, Neg liver ROS,   Endo/Other  negative endocrine ROS  Renal/GU negative Renal ROS  negative genitourinary   Musculoskeletal negative musculoskeletal ROS (+)   Abdominal   Peds  Hematology negative hematology ROS (+)   Anesthesia Other Findings   Reproductive/Obstetrics Breech in labor                           Anesthesia Physical Anesthesia Plan  ASA: II and emergent  Anesthesia Plan: Spinal   Post-op Pain Management:    Induction:   Airway Management Planned: Natural Airway  Additional Equipment:   Intra-op Plan:   Post-operative Plan:   Informed Consent: I have reviewed the patients History and Physical, chart, labs and discussed the procedure including the risks, benefits and alternatives for the proposed anesthesia with the patient or authorized representative who has indicated his/her understanding and acceptance.     Plan Discussed with: Anesthesiologist, Surgeon and CRNA  Anesthesia Plan Comments:         Anesthesia Quick Evaluation

## 2012-10-17 NOTE — H&P (Signed)
Sharon Garner is a 22 y.o. female [phone] [redacted]w[redacted]d presenting for evaluation of breech presentation. [place] [facility] presentation; patient received results today and was advised to f/u at MAU for possible cesarean section. Contractions began this AM. Denies leakage of fluid, vaginal bleeding, and vaginal discharge.  Pt denies complications throughout pregnancy and previous pregnancy/vaginal delivery.   Last oral intake: sips of water this morning. Denies solid food intake since yesterday.  . Maternal Medical History:  Reason for admission: Contractions.  Nausea.  Contractions: Onset was 6-12 hours ago.   Frequency: regular.   Duration is approximately 4 minutes.   Perceived severity is strong.    Fetal activity: Perceived fetal activity is normal.   Last perceived fetal movement was within the past hour.    Prenatal complications: No bleeding, hypertension or substance abuse.     OB History   Grav Para Term Preterm Abortions TAB SAB Ect Mult Living   3 1 1  1  1   1      Past Medical History  Diagnosis Date  . Migraine    Past Surgical History  Procedure Laterality Date  . No past surgeries     Family History: family history is negative for Other, and Alcohol abuse, and Arthritis, and Asthma, and Birth defects, and Cancer, and COPD, and Depression, and Diabetes, and Drug abuse, and Early death, and Hearing loss, and Heart disease, and Hyperlipidemia, and Hypertension, and Kidney disease, and Learning disabilities, and Mental illness, and Mental retardation, and Miscarriages / Stillbirths, and Stroke, and Vision loss, . Social History:  reports that she has never smoked. She has never used smokeless tobacco. She reports that she does not drink alcohol or use illicit drugs.   Prenatal Transfer Tool  Maternal Diabetes: No Genetic Screening: Normal Maternal Ultrasounds/Referrals: Normal Fetal Ultrasounds or other Referrals:  None Maternal  Substance Abuse:  No Significant Maternal Medications:  None Significant Maternal Lab Results:  None Other Comments:  None  Review of Systems  Eyes: Negative for blurred vision and double vision.  Respiratory: Negative for shortness of breath.   Cardiovascular: Negative for chest pain.  Gastrointestinal: Negative for nausea and vomiting. Abdominal pain: Reports upper abdominal pain; describes baby "pushing up"  Neurological: Negative for dizziness and headaches.  Psychiatric/Behavioral: Negative for substance abuse.    Dilation: 1 Effacement (%): Thick Station: -3 Exam by:: Sharon German RN; Sharon Robinson RN Blood pressure 109/66, pulse 78, temperature 97.8 F (36.6 Sharon), temperature source Oral, resp. rate 17, height 5\' 2"  (1.575 m), weight 72.122 kg (159 lb), last menstrual period 09/23/2011, SpO2 98.00%. Maternal Exam:  Uterine Assessment: Contraction strength is firm.  Contraction duration is 4 minutes. Contraction frequency is regular.   Abdomen: Fetal presentation: breech     Fetal Exam Fetal State Assessment: Category I - tracings are normal.    FHR 145-150s Moderate variability. Absent decelerations.    Physical Exam  Constitutional: She appears well-developed and well-nourished.  Cardiovascular: Normal rate, regular rhythm and normal heart sounds.   No murmur heard. Respiratory: Effort normal and breath sounds normal. No respiratory distress.  GI: Soft. Distention: gravid but not otherwise distended. There is no tenderness. There is no rebound and no guarding.  Musculoskeletal: She exhibits no edema and no tenderness.  Skin: Skin is warm and dry. No erythema.    Dilation: 1 Effacement (%): Thick Cervical Position: Posterior Station: -3 Exam by:: Sharon German RN; Sharon Husky RN  [place] confirmed breech presentation  Assessment/Plan: Ax: [redacted]w[redacted]d, [id number], single intrauterine pregnancy, breech presentation Latent phase labor, contractions q4 minutes  Plan: Cesarean section    Sharon Garner 10/17/2012, 2:24 PM

## 2012-10-17 NOTE — MAU Note (Signed)
Patient sent from home per Dr. Marice Potter for rule out labor per patient. Patient states baby is breech. Denies bleeding nor leaking of fluid. Intermittent contractions.

## 2012-10-17 NOTE — H&P (Signed)
Attestation of Attending Supervision of Physician Assistant Student: Evaluation and management procedures were performed by the PA Student under my supervision.  I have seen and examined the patient, reviewed the the student's note and chart, and I agree with management and plan.  The risks of cesarean section discussed with the patient included but were not limited to: bleeding which may require transfusion or reoperation; infection which may require antibiotics; injury to bowel, bladder, ureters or other surrounding organs; injury to the fetus; need for additional procedures including hysterectomy in the event of a life-threatening hemorrhage; placental abnormalities wth subsequent pregnancies, incisional problems, thromboembolic phenomenon and other postoperative/anesthesia complications. The patient concurred with the proposed plan, giving informed written consent for the procedure.   Patient has been NPO since last night she will remain NPO for procedure. Anesthesia and OR aware. Preoperative labs, medications and IV fluids ordered.  Preoperative prophylactic antibiotics and SCDs ordered on call to the OR.  To OR when ready.  For now, will give a dose of terbutaline to help with reducing the frequency of contractions.  Category I FHR tracing.    Jaynie Collins, MD, FACOG Attending Obstetrician & Gynecologist Faculty Practice, [facility] [place]

## 2012-10-17 NOTE — Anesthesia Postprocedure Evaluation (Signed)
Anesthesia Post Note  Patient: Sharon Garner  Procedure(s) Performed: Procedure(s) (LRB): CESAREAN SECTION (N/A)  Anesthesia type: Spinal  Patient location: PACU  Post pain: Pain level controlled  Post assessment: Post-op Vital signs reviewed  Last Vitals:  Filed Vitals:   10/17/12 1915  BP: 116/57  Pulse: 83  Temp:   Resp: 15    Post vital signs: Reviewed  Level of consciousness: awake  Complications: No apparent anesthesia complications

## 2012-10-17 NOTE — Op Note (Signed)
Sharon Garner PROCEDURE DATE: 10/17/2012  PREOPERATIVE DIAGNOSES: Intrauterine pregnancy at  [redacted]w[redacted]d weeks gestation; frank breech presentation of fetus; labor  POSTOPERATIVE DIAGNOSES: The same  PROCEDURE: Primary Low Transverse Cesarean Section  SURGEON:  Dr. Jaynie Collins  ASSISTANT:  Ardis Hughs, PA-S  ANESTHESIOLOGIST: Dr. Mal Amabile  INDICATIONS: Sharon Garner is a 22 y.o. [phone] at [redacted]w[redacted]d here for cesarean section secondary to the indications listed under preoperative diagnoses; please see preoperative note for further details.  The risks of cesarean section were discussed with the patient including but were not limited to: bleeding which may require transfusion or reoperation; infection which may require antibiotics; injury to bowel, bladder, ureters or other surrounding organs; injury to the fetus; need for additional procedures including hysterectomy in the event of a life-threatening hemorrhage; placental abnormalities wth subsequent pregnancies, incisional problems, thromboembolic phenomenon and other postoperative/anesthesia complications.   The patient concurred with the proposed plan, giving informed written consent for the procedure.    FINDINGS:  Viable female infant in frank breech presentation.  Apgars 8 and 9.  Meconium stained amniotic fluid.  Intact placenta, three vessel cord.  Normal uterus, fallopian tubes and ovaries bilaterally.  ANESTHESIA: Spinal INTRAVENOUS FLUIDS: 2200 ml ESTIMATED BLOOD LOSS: 800 ml URINE OUTPUT:  200 ml SPECIMENS: Placenta sent to L&D COMPLICATIONS: None immediate  PROCEDURE IN DETAIL:  The patient preoperatively received intravenous antibiotics and had sequential compression devices applied to her lower extremities.  She was then taken to the operating room where spinal anesthesia was administered and was found to be adequate. She was then placed in a dorsal supine position with a leftward tilt, and prepped and draped  in a sterile manner.  A foley catheter was placed into her bladder and attached to constant gravity.  After an adequate timeout was performed, a Pfannenstiel skin incision was made with scalpel and carried through to the underlying layer of fascia. The fascia was incised in the midline, and this incision was extended bilaterally using the Mayo scissors.  Kocher clamps were applied to the superior aspect of the fascial incision and the underlying rectus muscles were dissected off bluntly. A similar process was carried out on the inferior aspect of the fascial incision. The rectus muscles were separated in the midline bluntly and the peritoneum was entered bluntly. Attention was turned to the lower uterine segment where a low transverse hysterotomy was made with a scalpel and extended bilaterally bluntly.  The infant was successfully delivered, the cord was clamped and cut and the infant was handed over to awaiting neonatology team. Uterine massage was then administered, and the placenta delivered intact with a three-vessel cord. The uterus was then cleared of clot and debris.  The hysterotomy was closed with 0 Vicryl in a running locked fashion, and an imbricating layer was also placed with 0 Vicryl. The pelvis was cleared of all clot and debris. Hemostasis was confirmed on all surfaces.  The peritoneum and the muscles were reapproximated using 0 Vicryl interrupted stitches. The fascia was then closed using 0 Vicryl in a running fashion.  The subcutaneous layer was irrigated, and 30 ml of 0.5% Marcaine was injected subcutaneously around the incision.  The skin was closed with a 4-0 Vicryl subcuticular stitch.  Steristrips were placed on the incision with benzoin ointment. The patient tolerated the procedure well. Sponge, lap, instrument and needle counts were correct x 2.  She was taken to the recovery room in stable condition.

## 2012-10-17 NOTE — Anesthesia Procedure Notes (Signed)
Spinal  Patient location during procedure: OR Start time: 10/17/2012 4:20 PM Staffing Anesthesiologist: Yohan Samons A. Performed by: anesthesiologist  Preanesthetic Checklist Completed: patient identified, site marked, surgical consent, pre-op evaluation, timeout performed, IV checked, risks and benefits discussed and monitors and equipment checked Spinal Block Patient position: sitting Prep: site prepped and draped and DuraPrep Patient monitoring: heart rate, cardiac monitor, continuous pulse ox and blood pressure Approach: midline Location: L3-4 Injection technique: single-shot Needle Needle type: Sprotte  Needle gauge: 24 G Needle length: 9 cm Needle insertion depth: 5 cm Assessment Sensory level: T4 Events: paresthesia Additional Notes Patient tolerated procedure well. Transient paresthesia right foot. Adequate sensory level.

## 2012-10-18 LAB — CBC
Hemoglobin: 8.5 g/dL — ABNORMAL LOW (ref 12.0–15.0)
MCH: 23.7 pg — ABNORMAL LOW (ref 26.0–34.0)
RBC: 3.58 MIL/uL — ABNORMAL LOW (ref 3.87–5.11)

## 2012-10-18 MED ORDER — FERROUS SULFATE 325 (65 FE) MG PO TABS
325.0000 mg | ORAL_TABLET | Freq: Two times a day (BID) | ORAL | Status: DC
  Administered 2012-10-18 – 2012-10-19 (×2): 325 mg via ORAL
  Filled 2012-10-18 (×2): qty 1

## 2012-10-18 NOTE — Progress Notes (Signed)
Subjective: Postpartum Day 1: Cesarean Delivery Eating, drinking, ambulating well.  No flatus.  Lochia and pain wnl.  Denies dizziness, lightheadedness, or sob. No complaints.   Still has foley  Objective: Vital signs in last 24 hours: Temp:  [97.4 F (36.3 C)-98.5 F (36.9 C)] 97.6 F (36.4 C) (06/28 0500) Pulse Rate:  [68-106] 68 (06/28 0500) Resp:  [15-21] 16 (06/28 0500) BP: (99-121)/(52-72) 109/72 mmHg (06/28 0500) SpO2:  [96 %-100 %] 97 % (06/28 0500) Weight:  [72.122 kg (159 lb)] 72.122 kg (159 lb) (06/27 1310)  Physical Exam:  General: alert, cooperative and no distress Lochia: appropriate Uterine Fundus: firm Incision: healing well, no significant drainage, no dehiscence, no significant erythema DVT Evaluation: No evidence of DVT seen on physical exam. Negative Homan's sign. No cords or calf tenderness. No significant calf/ankle edema.   Recent Labs  10/17/12 1510 10/18/12 0604  HGB 9.8* 8.5*  HCT 30.4* 26.4*    Assessment/Plan: Status post Cesarean section. Doing well postoperatively.  Continue current care. Add iron supplementation Breast and bottlefeeding, desires depo for contraception D/C foley  Marge Duncans 10/18/2012, 8:43 AM

## 2012-10-18 NOTE — Progress Notes (Signed)

## 2012-10-18 NOTE — Lactation Note (Signed)
This note was copied from the chart of [facility] [employer]. Lactation Consultation Note  Patient Name: Sharon Garner [id number] Date: 10/18/2012 Reason for consult: Follow-up assessment   Maternal Data Formula Feeding for Exclusion: Yes Reason for exclusion: Mother's choice to formula and breast feed on admission  Feeding Feeding Type: Breast Milk Feeding method: Breast Nipple Type: Slow - flow Length of feed: 10 min  LATCH Score/Interventions Latch: Grasps breast easily, tongue down, lips flanged, rhythmical sucking.  Audible Swallowing: A few with stimulation Intervention(s): Skin to skin  Type of Nipple: Everted at rest and after stimulation  Comfort (Breast/Nipple): Soft / non-tender     Hold (Positioning): Assistance needed to correctly position infant at breast and maintain latch. Intervention(s): Breastfeeding basics reviewed;Support Pillows  LATCH Score: 8  Lactation Tools Discussed/Used     Consult Status Consult Status: PRN  Follow up visit with mom who had baby latched to breast when I went in. I assisted her with pillows to make her more comfortable. Mom has given bottles of formula through the night. Encouraged to always BF first to promote a good milk supply. She reports no pain with nursing. No questions at present. To call prn  Pamelia Hoit 10/18/2012, 8:32 AM

## 2012-10-18 NOTE — Progress Notes (Signed)
Pt complaining of two small red raised areas to right posterior forearm that are painful. They appear to look like bug bites. Cold compress applied. This was at 10 am shortly after patient ambulated to bathroom. Bed was stripped of all linen, new gown was placed on patient. At 11 am patient states  She has pain in her forearm starts at her elbow and radiates to her shoulder. Joellyn Haff, CNM notified and here to examine patient.

## 2012-10-18 NOTE — Progress Notes (Addendum)
Patient ID: Sharon Garner, female   DOB: May 21, 1990, 22 y.o.   MRN: [id number] Called to room by RN who states pt has 2 bug bites Rt elbow w/ sharp shooting pains into Rt shoulder  S: Pt reports just noticing the bug bites, they are itchy and painful, and she has a constant ache w/ some shooting pains into shoulder. Hurts to move arm. No numbness O: Two app 1cm round elevated slightly erythematous areas near Rt elbow. No extended erythema, heat, or induration. Rt upper arm/shoulder tender to palpation.      IV on opposite arm      Has ice pack on arm A: Possible bug bites P: Discussed w/ Dr. Jolayne Panther     Continue comfort measures: ice pack, pain meds, benadryl      Marge Duncans, CNM 10/18/2012 12:08 PM

## 2012-10-19 MED ORDER — FERROUS SULFATE 325 (65 FE) MG PO TABS: 325.0000 mg | ORAL_TABLET | Freq: Two times a day (BID) | ORAL | Status: DC

## 2012-10-19 MED ORDER — IBUPROFEN 600 MG PO TABS: 600.0000 mg | ORAL_TABLET | Freq: Four times a day (QID) | ORAL | Status: DC | PRN

## 2012-10-19 MED ORDER — OXYCODONE-ACETAMINOPHEN 5-325 MG PO TABS: 1.0000 | ORAL_TABLET | ORAL | Status: DC | PRN

## 2012-10-19 NOTE — Discharge Summary (Signed)
Obstetric Discharge Summary Reason for Admission: onset of labor and breech presentation Prenatal Procedures: ultrasound Intrapartum Procedures: cesarean: low cervical, transverse Postpartum Procedures: none Complications-Operative and Postpartum: none  Eating, drinking, voiding, ambulating well.  +flatus.  Lochia and pain wnl.  Denies dizziness, lightheadedness, or sob. No complaints.  Desires early d/c Rt arm/bug bites better, still itches, pain is resolved  Hemoglobin  Date Value Range Status  10/18/2012 8.5* 12.0 - 15.0 g/dL Final     HCT  Date Value Range Status  10/18/2012 26.4* 36.0 - 46.0 % Final    Physical Exam:  General: alert, cooperative and no distress Lochia: appropriate Uterine Fundus: firm Incision: healing well, no significant drainage, no dehiscence, no significant erythema DVT Evaluation: No evidence of DVT seen on physical exam. Negative Homan's sign. No cords or calf tenderness. No significant calf/ankle edema.    F/U 1wk in clinic for incision check, then 6wks for postpartum visit at [facility]  Discharge Diagnoses: Term Pregnancy-delivered  Discharge Information: Date: 10/19/2012 Activity: pelvic rest Diet: routine Medications: Ibuprofen, Iron and Percocet Condition: stable Instructions: refer to practice specific booklet Discharge to: home   Newborn Data: Live born female  Birth Weight: 6 lb 14.9 oz (3145 g) APGAR: 8, 9  Home with mother. Breastfeeding, depo for contraception Marge Duncans 10/19/2012, 8:08 AM

## 2012-10-20 ENCOUNTER — Encounter (HOSPITAL_COMMUNITY): Payer: Self-pay | Admitting: Obstetrics & Gynecology

## 2012-10-20 NOTE — Progress Notes (Signed)
Ur chart review completed.  

## 2012-10-20 NOTE — Discharge Summary (Signed)
Attestation of Attending Supervision of Advanced Practitioner (CNM/NP): Evaluation and management procedures were performed by the Advanced Practitioner under my supervision and collaboration.  I have reviewed the Advanced Practitioner's note and chart, and I agree with the management and plan.  Yoan Sallade 10/20/2012 9:00 AM

## 2012-10-26 ENCOUNTER — Inpatient Hospital Stay (HOSPITAL_COMMUNITY): Admission: RE | Admit: 2012-10-26 | Payer: Medicaid Other | Source: Ambulatory Visit

## 2012-11-17 ENCOUNTER — Ambulatory Visit: Payer: Medicaid Other | Admitting: Obstetrics and Gynecology

## 2012-11-24 ENCOUNTER — Inpatient Hospital Stay (HOSPITAL_COMMUNITY)
Admission: AD | Admit: 2012-11-24 | Discharge: 2012-11-25 | Disposition: A | Discharge status: Home / Self Care | Payer: Medicaid Other | Source: Ambulatory Visit | Attending: Obstetrics and Gynecology | Admitting: Obstetrics and Gynecology

## 2012-11-24 DIAGNOSIS — Z5189 Encounter for other specified aftercare: Secondary | ICD-10-CM

## 2012-11-24 DIAGNOSIS — O909 Complication of the puerperium, unspecified: Secondary | ICD-10-CM | POA: Insufficient documentation

## 2012-11-25 DIAGNOSIS — G8918 Other acute postprocedural pain: Secondary | ICD-10-CM

## 2012-11-25 DIAGNOSIS — R1084 Generalized abdominal pain: Secondary | ICD-10-CM

## 2012-11-25 DIAGNOSIS — Z5189 Encounter for other specified aftercare: Secondary | ICD-10-CM

## 2012-11-25 NOTE — MAU Note (Signed)
Delivered via C/S on Jun 27th, no complications.  Noticed a 0.5cm opening on incision scar for the past week, called office and was told to keep her appt on the 18th of August.  Pt was concerned about the wait and said it was hurting too bad to wait.

## 2012-11-25 NOTE — MAU Provider Note (Signed)
History     CSN: [id number]  Arrival date and time: 11/24/12 2354   None     No chief complaint on file.  HPI Sharon Garner is a 22 y.o. [id number] post C-section on July 27 presents with complaints of surgical pain and oozing from wound. Patient states 2 weeks after surgery started having progressively worsening pain OB History   Grav Para Term Preterm Abortions TAB SAB Ect Mult Living   3 2 2  1  1   2       Past Medical History  Diagnosis Date  . Migraine     Past Surgical History  Procedure Laterality Date  . No past surgeries    . Cesarean section N/A 10/17/2012    Procedure: CESAREAN SECTION;  Surgeon: Tereso Newcomer, MD;  Location: [facility];  Service: Obstetrics;  Laterality: N/A;    Family History  Problem Relation Age of Onset  . Other Neg Hx   . Alcohol abuse Neg Hx   . Arthritis Neg Hx   . Asthma Neg Hx   . Birth defects Neg Hx   . Cancer Neg Hx   . COPD Neg Hx   . Depression Neg Hx   . Diabetes Neg Hx   . Drug abuse Neg Hx   . Early death Neg Hx   . Hearing loss Neg Hx   . Heart disease Neg Hx   . Hyperlipidemia Neg Hx   . Hypertension Neg Hx   . Kidney disease Neg Hx   . Learning disabilities Neg Hx   . Mental illness Neg Hx   . Mental retardation Neg Hx   . Miscarriages / Stillbirths Neg Hx   . Stroke Neg Hx   . Vision loss Neg Hx     History  Substance Use Topics  . Smoking status: Never Smoker   . Smokeless tobacco: Never Used  . Alcohol Use: No    Allergies: No Known Allergies  Prescriptions prior to admission  Medication Sig Dispense Refill  . ferrous sulfate 325 (65 FE) MG tablet Take 1 tablet (325 mg total) by mouth 2 (two) times daily with a meal.  60 tablet  3  . ibuprofen (ADVIL,MOTRIN) 600 MG tablet Take 1 tablet (600 mg total) by mouth every 6 (six) hours as needed for pain.  30 tablet  0  . oxyCODONE-acetaminophen (PERCOCET/ROXICET) 5-325 MG per tablet Take 1-2 tablets by mouth every 4 (four) hours as needed.  30  tablet  0  . Prenatal Vit-Fe Fumarate-FA (PRENATAL MULTIVITAMIN) TABS Take 1 tablet by mouth daily at 12 noon.        Review of Systems  Constitutional: Negative for fever and chills.  Gastrointestinal: Positive for abdominal pain. Negative for diarrhea and constipation.  Genitourinary: Negative for dysuria and urgency.  Neurological: Negative for headaches.   Physical Exam   Blood pressure 105/61, pulse 66, temperature 98.3 F (36.8 C), temperature source Oral, resp. rate 16, height 5\' 2"  (1.575 m), unknown if currently breastfeeding.  Physical Exam  Constitutional: She is oriented to person, place, and time. She appears well-developed and well-nourished. No distress.  HENT:  Head: Normocephalic and atraumatic.  GI: Soft. Bowel sounds are normal. She exhibits no distension and no mass. There is tenderness (tender along superior bodrer of incision. no fluctuance on exam. minimal serous sanguinous discharge. Pt with  mniimal dehisicne at right border of incision.). There is no rebound and no guarding.  Neurological: She is alert and oriented to  person, place, and time.  Skin: Skin is warm and dry. No rash noted. She is not diaphoretic. No erythema. No pallor.  Psychiatric: She has a normal mood and affect. Her behavior is normal. Judgment and thought content normal.    MAU Course  Procedures  MDM Evaluated, unable to express signficant discharge. Pt comofortable with eval in clinic if able to schedule this week. Sent note to attempt to scheduled.   Assessment and Plan  Sharon Garner is a 22 y.o. [id number] s/p Csection on 27june. Pt with mild wound dehisence unable to pack. Will have seen in clinic by surgeon for additional evaluation. No evidence of infection. Afebrile, nonerythrematous, non purulent discharge. Reassuring at this time.  Jolyn Lent, RYAN 11/25/2012, 1:31 AM

## 2012-11-26 ENCOUNTER — Ambulatory Visit (INDEPENDENT_AMBULATORY_CARE_PROVIDER_SITE_OTHER): Payer: Medicaid Other | Admitting: Family Medicine

## 2012-11-26 ENCOUNTER — Encounter: Payer: Self-pay | Admitting: Family Medicine

## 2012-11-26 VITALS — BP 126/69 | HR 95 | Temp 98.4°F | Ht 62.0 in | Wt 147.4 lb

## 2012-11-26 DIAGNOSIS — Z30017 Encounter for initial prescription of implantable subdermal contraceptive: Secondary | ICD-10-CM

## 2012-11-26 DIAGNOSIS — IMO0001 Reserved for inherently not codable concepts without codable children: Secondary | ICD-10-CM

## 2012-11-26 DIAGNOSIS — Z5189 Encounter for other specified aftercare: Secondary | ICD-10-CM

## 2012-11-26 MED ORDER — ETONOGESTREL 68 MG ~~LOC~~ IMPL
68.0000 mg | DRUG_IMPLANT | Freq: Once | SUBCUTANEOUS | Status: AC
  Administered 2012-11-26: 68 mg via SUBCUTANEOUS

## 2012-11-26 NOTE — Progress Notes (Signed)
  Subjective:     Tayte Childers is a 22 y.o. female who presents for a postpartum visit. She is 6 week postpartum following a low cervical transverse Cesarean section. I have fully reviewed the prenatal and intrapartum course. The delivery was at 40+2 gestational weeks. Outcome: . Anesthesia: spinal. Postpartum course has been uncomplicated. Baby's course has been uncomplicated. Baby is feeding by breast. Bleeding no bleeding. Bowel function is normal. Bladder function is normal. Patient is not sexually active. Contraception method is Nexplanon. Postpartum depression screening: negative.  The following portions of the patient's history were reviewed and updated as appropriate: allergies, current medications, past family history, past medical history, past social history, past surgical history and problem list.  Review of Systems Pertinent items are noted in HPI.   Objective:    BP 126/69  Pulse 95  Temp(Src) 98.4 F (36.9 C) (Oral)  Ht 5\' 2"  (1.575 m)  Wt 66.86 kg (147 lb 6.4 oz)  BMI 26.95 kg/m2  Breastfeeding? Yes  General:  alert, cooperative, appears stated age and no distress     Lungs: clear to auscultation bilaterally  Heart:  regular rate and rhythm, S1, S2 normal, no murmur, click, rub or gallop  Abdomen: soft, non-tender; bowel sounds normal; no masses,  no organomegaly and incision intact, healing well, minorly tender along incision. RLQ minor tenderness. Small amount of serous discharge from small granulation tissue. and small granulation tissue on left end of incision                          Assessment:     LTCS postpartum exam. Pap smear not done at today's visit.  pt 22 and reports negative last year  Plan:    1. Contraception: Nexplanon 2. No additional labs # wound treated with silver nitrate with good resolution. 3. Follow up in: as needed    Mozell Haber is a 23 y.o. year old Hispanic female here for Nexplanon insertion.  Her LMP was  prior to delivery , and her pregnancy test today was negative.  Risks/benefits/side effects of Nexplanon have been discussed and her questions have been answered.  Specifically, a failure rate of 04/998 has been reported, with an increased failure rate if pt takes St. John's Wort and/or antiseizure medicaitons.  Tiwana Hipolito Bayley Cerros is aware of the common side effect of irregular bleeding, which the incidence of decreases over time.  Her left arm, approximatly 4 inches proximal from the elbow, was cleansed with alcohol and anesthetized with 2cc of 2% Lidocaine.  The area was cleansed again and the Nexplanon was inserted without difficulty.  A pressure bandage was applied.  Pt was instructed to remove pressure bandage in a few hours, and keep insertion site covered with a bandaid for 3 days.  Back up contraception was recommended for 2 weeks.  Follow-up scheduled PRN problems  Tawana Scale 11/26/2012 4:04 PM  Coding [id number] /  [phone]

## 2012-11-26 NOTE — Patient Instructions (Addendum)
Etonogestrel implant Qu es este medicamento? El ETONOGESTREL es un dispositivo anticonceptivo (control de la natalidad). Se utiliza para Neurosurgeon. Se puede utilizar hasta 3 aos. Este medicamento puede ser utilizado para otros usos; si tiene alguna pregunta consulte con su proveedor de atencin mdica o con su farmacutico. Qu le debo informar a mi profesional de la salud antes de tomar este medicamento? Necesita saber si usted presenta alguno de los siguientes problemas o situaciones: -sangrado vaginal anormal -enfermedad vascular o cogulos sanguneos -cncer de mama, cervical, heptico -depresin -diabetes -enfermedad de la vescula biliar -dolores de cabeza -enfermedad cardiaca o ataque cardiaco reciente -alta presin sangunea -alto nivel de colesterol -enfermedad renal -enfermedad heptica -convulsiones -fuma tabaco -una reaccin alrgica o inusual al etonogestrel, otras hormonas, anestsicos o antispticos, medicamentos, alimentos, colorantes o conservantes -si est embarazada o buscando quedar embarazada -si est amamantando a un beb Cmo debo utilizar este medicamento? Este dispositivo se inserta debajo de la piel en la cara interna de la parte superior del brazo por un profesional de Radiographer, therapeutic. Hable con su pediatra para informarse acerca del uso de este medicamento en nios. Puede requerir atencin especial. Sobredosis: Pngase en contacto inmediatamente con un centro toxicolgico o una sala de urgencia si usted cree que haya tomado demasiado medicamento. ATENCIN: [employer] es solo para usted. No comparta este medicamento con nadie. Qu sucede si me olvido de una dosis? No se aplica en este caso. Qu puede interactuar con este medicamento? No tome esta medicina con ninguno de los siguientes medicamentos: -amprenavir -bosentano -fosamprenavir  Esta medicina tambin puede interactuar con los siguientes medicamentos: -medicamentos barbitricos  para inducir el sueo o tratar convulsiones -ciertos medicamentos para las infecciones micticas tales como quetoconazol e itraconazol -griseofulvina -medicamentos para tratar convulsiones, tales como carbamazepina, felbamato, oxcarbazepina, fenitona, topiramato -modafinil -fenilbutazona -rifampicina -algunos medicamentos para tratar la infeccin por VIH tales como atazanavir, indinavir, lopinavir, nelfinavir, tipranavir, ritonavir -hierba de [address] ser que esta lista no menciona todas las posibles interacciones. Informe a su profesional de [employer] de [employer] productos a base de hierbas, medicamentos de [place] o suplementos nutritivos que est tomando. Si usted fuma, consume bebidas alcohlicas o si utiliza drogas ilegales, indqueselo tambin a su profesional de [employer]. Algunas sustancias pueden interactuar con su medicamento. A qu debo estar atento al usar [employer]? Este medicamento no protege contra la infeccin por el VIH (SIDA) o otras enfermedades de transmisin sexual. Usted debe sentir el implante al presionar con las yemas de los dedos sobre la piel donde se insert. Dgale a su mdico si no se siente el implante. Qu efectos secundarios puedo tener al [employer] este medicamento? Efectos secundarios que debe informar a su mdico o a Producer, television/film/video de la salud tan pronto como sea posible: -Therapist, art como erupcin cutnea, picazn o urticarias, hinchazn de la cara, labios o lengua -ndulos mamarios -cambios en la visin -confusin, dificultad para hablar o entender -orina de color oscura -humor deprimido -sensacin general de estar enfermo o sntomas gripales -heces claras -prdida del apetito, nuseas -dolor en la regin abdominal superior derecha -dolores de Advertising account executive, hinchazon o sensibilidad grave en el abdomen -falta de aliento, dolor en el pecho, hinchazn de la pierna -seales de Chartered loss adjuster -entumecimiento o debilidad  repentina de la cara, brazo o pierna -dificultad para caminar, mareos, prdida de equilibrio o coordinacin -sangrado o flujo vaginal inusual -cansancio o debilidad inusual -color amarillento de los ojos o la piel  Efectos secundarios que,  por lo general, no requieren atencin mdica (debe informarlos a su mdico o a su profesional de la salud si persisten o si son molestos): -acn -dolor de pecho -cambios de peso -tos -fiebre o escalofros -dolor de cabeza -sangrado menstrual irregular -picazn, ardo o flujo vaginal -dolor o dificultad para Geographical information systems officer -dolor de garganta Puede ser que esta lista no menciona todos los posibles efectos secundarios. Comunquese a su mdico por asesoramiento mdico [employer]. Usted puede informar los efectos secundarios a la FDA por telfono al 1-800-FDA-1088. Dnde debo guardar mi medicina? Este medicamento se administra en hospitales o clnicas y no necesitar guardarlo en su domicilio. ATENCIN: Este folleto es un resumen. Puede ser que no cubra toda la posible informacin. Si usted tiene preguntas acerca de esta medicina, consulte con su mdico, su farmacutico o su profesional de Radiographer, therapeutic.  2013, Elsevier/Gold Standard. (08/07/2011 11:44:43 AM)  Etonogestrel implant Qu es este medicamento? El ETONOGESTREL es un dispositivo anticonceptivo (control de la natalidad). Se utiliza para Neurosurgeon. Se puede utilizar hasta 3 aos. Este medicamento puede ser utilizado para otros usos; si tiene alguna pregunta consulte con su proveedor de atencin mdica o con su farmacutico. Qu le debo informar a mi profesional de la salud antes de tomar este medicamento? Necesita saber si usted presenta alguno de los siguientes problemas o situaciones: -sangrado vaginal anormal -enfermedad vascular o cogulos sanguneos -cncer de mama, cervical, heptico -depresin -diabetes -enfermedad de la vescula biliar -dolores de cabeza -enfermedad  cardiaca o ataque cardiaco reciente -alta presin sangunea -alto nivel de colesterol -enfermedad renal -enfermedad heptica -convulsiones -fuma tabaco -una reaccin alrgica o inusual al etonogestrel, otras hormonas, anestsicos o antispticos, medicamentos, alimentos, colorantes o conservantes -si est embarazada o buscando quedar embarazada -si est amamantando a un beb Cmo debo utilizar este medicamento? Este dispositivo se inserta debajo de la piel en la cara interna de la parte superior del brazo por un profesional de Radiographer, therapeutic. Hable con su pediatra para informarse acerca del uso de este medicamento en nios. Puede requerir atencin especial. Sobredosis: Pngase en contacto inmediatamente con un centro toxicolgico o una sala de urgencia si usted cree que haya tomado demasiado medicamento. ATENCIN: [employer] es solo para usted. No comparta este medicamento con nadie. Qu sucede si me olvido de una dosis? No se aplica en este caso. Qu puede interactuar con este medicamento? No tome esta medicina con ninguno de los siguientes medicamentos: -amprenavir -bosentano -fosamprenavir  Esta medicina tambin puede interactuar con los siguientes medicamentos: -medicamentos barbitricos para inducir el sueo o tratar convulsiones -ciertos medicamentos para las infecciones micticas tales como quetoconazol e itraconazol -griseofulvina -medicamentos para tratar convulsiones, tales como carbamazepina, felbamato, oxcarbazepina, fenitona, topiramato -modafinil -fenilbutazona -rifampicina -algunos medicamentos para tratar la infeccin por VIH tales como atazanavir, indinavir, lopinavir, nelfinavir, tipranavir, ritonavir -hierba de [address] ser que esta lista no menciona todas las posibles interacciones. Informe a su profesional de [employer] de [employer] productos a base de hierbas, medicamentos de [place] o suplementos nutritivos que est tomando. Si usted fuma, consume  bebidas alcohlicas o si utiliza drogas ilegales, indqueselo tambin a su profesional de [employer]. Algunas sustancias pueden interactuar con su medicamento. A qu debo estar atento al usar [employer]? Este medicamento no protege contra la infeccin por el VIH (SIDA) o otras enfermedades de transmisin sexual. Usted debe sentir el implante al presionar con las yemas de los dedos sobre la piel donde se insert. Dgale a su mdico si no  se siente el implante. Qu efectos secundarios puedo tener al [employer] este medicamento? Efectos secundarios que debe informar a su mdico o a Producer, television/film/video de la salud tan pronto como sea posible: -Therapist, art como erupcin cutnea, picazn o urticarias, hinchazn de la cara, labios o lengua -ndulos mamarios -cambios en la visin -confusin, dificultad para hablar o entender -orina de color oscura -humor deprimido -sensacin general de estar enfermo o sntomas gripales -heces claras -prdida del apetito, nuseas -dolor en la regin abdominal superior derecha -dolores de Advertising account executive, hinchazon o sensibilidad grave en el abdomen -falta de aliento, dolor en el pecho, hinchazn de la pierna -seales de Chartered loss adjuster -entumecimiento o debilidad repentina de la cara, brazo o pierna -dificultad para caminar, mareos, prdida de equilibrio o coordinacin -sangrado o flujo vaginal inusual -cansancio o debilidad inusual -color amarillento de los ojos o la piel  Efectos secundarios que, por lo general, no requieren Psychologist, prison and probation services (debe informarlos a su mdico o a Producer, television/film/video de la salud si persisten o si son molestos): -acn -dolor de pecho -cambios de peso -tos -fiebre o escalofros -dolor de cabeza -sangrado menstrual irregular -picazn, ardo o flujo vaginal -dolor o dificultad para Geographical information systems officer -dolor de garganta Puede ser que esta lista no menciona todos los posibles efectos secundarios. Comunquese a su mdico por asesoramiento  mdico [employer]. Usted puede informar los efectos secundarios a la FDA por telfono al 1-800-FDA-1088. Dnde debo guardar mi medicina? Este medicamento se administra en hospitales o clnicas y no necesitar guardarlo en su domicilio. ATENCIN: Este folleto es un resumen. Puede ser que no cubra toda la posible informacin. Si usted tiene preguntas acerca de esta medicina, consulte con su mdico, su farmacutico o su profesional de Radiographer, therapeutic.  2013, Elsevier/Gold Standard. (08/07/2011 11:44:43 AM) Place gynecology postoperative visit patient instructions here.

## 2012-11-26 NOTE — MAU Provider Note (Signed)
Attestation of Attending Supervision of Advanced Practitioner (CNM/NP): Evaluation and management procedures were performed by the Advanced Practitioner under my supervision and collaboration.  I have reviewed the Advanced Practitioner's note and chart, and I agree with the management and plan.  Rahiem Schellinger 11/26/2012 10:26 AM

## 2012-12-02 ENCOUNTER — Encounter: Payer: Self-pay | Admitting: *Deleted

## 2012-12-12 ENCOUNTER — Encounter (HOSPITAL_COMMUNITY): Payer: Self-pay

## 2012-12-12 ENCOUNTER — Emergency Department (HOSPITAL_COMMUNITY)
Admission: EM | Admit: 2012-12-12 | Discharge: 2012-12-12 | Disposition: A | Discharge status: Home / Self Care | Payer: Medicaid Other | Attending: Emergency Medicine | Admitting: Emergency Medicine

## 2012-12-12 DIAGNOSIS — G43909 Migraine, unspecified, not intractable, without status migrainosus: Secondary | ICD-10-CM

## 2012-12-12 DIAGNOSIS — H53149 Visual discomfort, unspecified: Secondary | ICD-10-CM | POA: Insufficient documentation

## 2012-12-12 DIAGNOSIS — Z79899 Other long term (current) drug therapy: Secondary | ICD-10-CM | POA: Insufficient documentation

## 2012-12-12 MED ORDER — OXYCODONE-ACETAMINOPHEN 5-325 MG PO TABS
1.0000 | ORAL_TABLET | Freq: Once | ORAL | Status: AC
  Administered 2012-12-12: 1 via ORAL
  Filled 2012-12-12: qty 1

## 2012-12-12 MED ORDER — KETOROLAC TROMETHAMINE 60 MG/2ML IM SOLN
60.0000 mg | Freq: Once | INTRAMUSCULAR | Status: AC
  Administered 2012-12-12: 60 mg via INTRAMUSCULAR
  Filled 2012-12-12: qty 2

## 2012-12-12 MED ORDER — METOCLOPRAMIDE HCL 5 MG/ML IJ SOLN
10.0000 mg | Freq: Once | INTRAMUSCULAR | Status: AC
  Administered 2012-12-12: 10 mg via INTRAMUSCULAR
  Filled 2012-12-12: qty 2

## 2012-12-12 NOTE — ED Provider Notes (Signed)
CSN: [id number]     Arrival date & time 12/12/12  1532 History     First MD Initiated Contact with Patient 12/12/12 1604     Chief Complaint  Patient presents with  . Migraine    HPI Patient reports worsening headache over the past 3 days.  She has a history of migraine headaches.  She reports photophobia.  She denies fever.  She denies neck pain or neck stiffness.  She feels like she did possibly have chills at home.  No visual changes.  No weakness of her arms or leg.  No recent trauma.  No chest pain or shortness of breath.  Pain is mild to moderate in severity at this time.  Nothing improves her pain.   Past Medical History  Diagnosis Date  . Migraine    Past Surgical History  Procedure Laterality Date  . No past surgeries    . Cesarean section N/A 10/17/2012    Procedure: CESAREAN SECTION;  Surgeon: Tereso Newcomer, MD;  Location: [facility];  Service: Obstetrics;  Laterality: N/A;   Family History  Problem Relation Age of Onset  . Other Neg Hx   . Alcohol abuse Neg Hx   . Arthritis Neg Hx   . Asthma Neg Hx   . Birth defects Neg Hx   . Cancer Neg Hx   . COPD Neg Hx   . Depression Neg Hx   . Diabetes Neg Hx   . Drug abuse Neg Hx   . Early death Neg Hx   . Hearing loss Neg Hx   . Heart disease Neg Hx   . Hyperlipidemia Neg Hx   . Hypertension Neg Hx   . Kidney disease Neg Hx   . Learning disabilities Neg Hx   . Mental illness Neg Hx   . Mental retardation Neg Hx   . Miscarriages / Stillbirths Neg Hx   . Stroke Neg Hx   . Vision loss Neg Hx    History  Substance Use Topics  . Smoking status: Never Smoker   . Smokeless tobacco: Never Used  . Alcohol Use: No   OB History   Grav Para Term Preterm Abortions TAB SAB Ect Mult Living   3 2 2  1  1   2      Review of Systems  All other systems reviewed and are negative.    Allergies  Review of patient's allergies indicates no known allergies.  Home Medications   Current Outpatient Rx  Name  Route  Sig   Dispense  Refill  . acetaminophen (TYLENOL) 500 MG tablet   Oral   Take 1,000 mg by mouth 2 (two) times daily as needed for pain.         . Prenatal Vit-Fe Fumarate-FA (PRENATAL MULTIVITAMIN) TABS   Oral   Take 1 tablet by mouth daily at 12 noon.          BP 107/51  Pulse 72  Temp(Src) 98.2 F (36.8 C) (Oral)  Resp 20  SpO2 99%  LMP 12/09/2012 Physical Exam  Nursing note and vitals reviewed. Constitutional: She is oriented to person, place, and time. She appears well-developed and well-nourished. No distress.  HENT:  Head: Normocephalic and atraumatic.  Eyes: EOM are normal. Pupils are equal, round, and reactive to light.  Neck: Normal range of motion.  Cardiovascular: Normal rate, regular rhythm and normal heart sounds.   Pulmonary/Chest: Effort normal and breath sounds normal.  Abdominal: Soft. She exhibits no distension. There is  no tenderness.  Musculoskeletal: Normal range of motion.  Neurological: She is alert and oriented to person, place, and time.  5/5 strength in major muscle groups of  bilateral upper and lower extremities. Speech normal. No facial asymetry.   Skin: Skin is warm and dry.  Psychiatric: She has a normal mood and affect. Judgment normal.    ED Course   Procedures (including critical care time)  Labs Reviewed - No data to display No results found. 1. Migraine     MDM  Typical migraine headache for the pt. Non focal neuro exam. No recent head trauma. No fever. Doubt meningitis. Doubt intracranial bleed. Doubt normal pressure hydrocephalus. No indication for imaging. Will treat with migraine cocktail and reevaluate  7:03 PM Patient feels much better at this time.  Discharge home in good condition.  Lyanne Co, MD 12/12/12 Ebony Cargo

## 2012-12-12 NOTE — ED Notes (Signed)
Headache for 3 days,  Denies any n/v does have photophobia.  Last night she had chills.  Felt like she may have a fever.  Has a hx of migraines  No vision changes.

## 2013-02-26 ENCOUNTER — Other Ambulatory Visit: Payer: Self-pay

## 2013-08-18 ENCOUNTER — Encounter (HOSPITAL_COMMUNITY): Payer: Self-pay | Admitting: Emergency Medicine

## 2013-08-18 ENCOUNTER — Emergency Department (HOSPITAL_COMMUNITY)
Admission: EM | Admit: 2013-08-18 | Discharge: 2013-08-18 | Disposition: A | Discharge status: Home / Self Care | Payer: Medicaid Other | Attending: Emergency Medicine | Admitting: Emergency Medicine

## 2013-08-18 DIAGNOSIS — G43909 Migraine, unspecified, not intractable, without status migrainosus: Secondary | ICD-10-CM | POA: Insufficient documentation

## 2013-08-18 MED ORDER — SODIUM CHLORIDE 0.9 % IV BOLUS (SEPSIS)
1000.0000 mL | Freq: Once | INTRAVENOUS | Status: AC
  Administered 2013-08-18: 1000 mL via INTRAVENOUS

## 2013-08-18 MED ORDER — METOCLOPRAMIDE HCL 5 MG/ML IJ SOLN
10.0000 mg | Freq: Once | INTRAMUSCULAR | Status: AC
  Administered 2013-08-18: 10 mg via INTRAVENOUS
  Filled 2013-08-18: qty 2

## 2013-08-18 MED ORDER — IBUPROFEN 800 MG PO TABS: 800.0000 mg | ORAL_TABLET | Freq: Three times a day (TID) | ORAL | Status: DC | PRN

## 2013-08-18 MED ORDER — DIPHENHYDRAMINE HCL 50 MG/ML IJ SOLN
25.0000 mg | Freq: Once | INTRAMUSCULAR | Status: AC
  Administered 2013-08-18: 25 mg via INTRAVENOUS
  Filled 2013-08-18: qty 1

## 2013-08-18 MED ORDER — KETOROLAC TROMETHAMINE 30 MG/ML IJ SOLN
30.0000 mg | Freq: Once | INTRAMUSCULAR | Status: AC
  Administered 2013-08-18: 30 mg via INTRAVENOUS
  Filled 2013-08-18: qty 1

## 2013-08-18 NOTE — Discharge Instructions (Signed)
Return here as needed. Follow up with an urgent care or primary doctor.  °

## 2013-08-18 NOTE — ED Notes (Signed)
Pt c/o headache that has been intermittent for about month. Pt states that she takes tylenol and no longer helping.

## 2013-08-30 NOTE — ED Provider Notes (Signed)
CSN: [id number]     Arrival date & time 08/18/13  1435 History   First MD Initiated Contact with Patient 08/18/13 1552     Chief Complaint  Patient presents with  . Headache     (Consider location/radiation/quality/duration/timing/severity/associated sxs/prior Treatment) HPI  23 year old female with headache. Intermittent. Start about a month ago. Has been more persistent with the last 2 days. She has a history of what she calls migraine headaches. She's had similar headaches previously although they have not lasted this long. She's been taking Tylenol which initially seemed to help, but not much recently. She denies any trauma. No fevers or chills. No neck pain or neck stiffness. No acute visual changes. No numbness, tingling or loss of strength.  Past Medical History  Diagnosis Date  . Migraine    Past Surgical History  Procedure Laterality Date  . No past surgeries    . Cesarean section N/A 10/17/2012    Procedure: CESAREAN SECTION;  Surgeon: Tereso NewcomerUgonna A Anyanwu, MD;  Location: [facility];  Service: Obstetrics;  Laterality: N/A;   Family History  Problem Relation Age of Onset  . Other Neg Hx   . Alcohol abuse Neg Hx   . Arthritis Neg Hx   . Asthma Neg Hx   . Birth defects Neg Hx   . Cancer Neg Hx   . COPD Neg Hx   . Depression Neg Hx   . Diabetes Neg Hx   . Drug abuse Neg Hx   . Early death Neg Hx   . Hearing loss Neg Hx   . Heart disease Neg Hx   . Hyperlipidemia Neg Hx   . Hypertension Neg Hx   . Kidney disease Neg Hx   . Learning disabilities Neg Hx   . Mental illness Neg Hx   . Mental retardation Neg Hx   . Miscarriages / Stillbirths Neg Hx   . Stroke Neg Hx   . Vision loss Neg Hx    History  Substance Use Topics  . Smoking status: Never Smoker   . Smokeless tobacco: Never Used  . Alcohol Use: No   OB History   Grav Para Term Preterm Abortions TAB SAB Ect Mult Living   3 2 2  1  1   2      Review of Systems  All systems reviewed and negative, other than as  noted in HPI.   Allergies  Review of patient's allergies indicates no known allergies.  Home Medications   Prior to Admission medications   Medication Sig Start Date End Date Taking? Authorizing Provider  acetaminophen (TYLENOL) 500 MG tablet Take 1,000 mg by mouth every 4 (four) hours as needed for headache.    Yes Historical Provider, MD  ibuprofen (ADVIL,MOTRIN) 200 MG tablet Take 400 mg by mouth every 8 (eight) hours as needed for headache.   Yes Historical Provider, MD  ibuprofen (ADVIL,MOTRIN) 800 MG tablet Take 1 tablet (800 mg total) by mouth every 8 (eight) hours as needed. 08/18/13   Jamesetta Orleanshristopher W Lawyer, PA-C   BP 118/79  Pulse 78  Temp(Src) 98.4 F (36.9 C) (Oral)  Resp 16  SpO2 100% Physical Exam  Nursing note and vitals reviewed. Constitutional: She is oriented to person, place, and time. She appears well-developed and well-nourished. No distress.  HENT:  Head: Normocephalic and atraumatic.  Right Ear: External ear normal.  Left Ear: External ear normal.  Mouth/Throat: Oropharynx is clear and moist.  Eyes: Conjunctivae and EOM are normal. Pupils are equal, round,  and reactive to light. Right eye exhibits no discharge. Left eye exhibits no discharge.  Neck: Neck supple.  No nuchal rigidity  Cardiovascular: Normal rate, regular rhythm and normal heart sounds.  Exam reveals no gallop and no friction rub.   No murmur heard. Pulmonary/Chest: Effort normal and breath sounds normal. No respiratory distress.  Abdominal: Soft. She exhibits no distension. There is no tenderness.  Musculoskeletal: She exhibits no edema and no tenderness.  Neurological: She is alert and oriented to person, place, and time. No cranial nerve deficit. She exhibits normal muscle tone. Coordination normal.  Good finger to nose bilaterally. Gait is steady.  Skin: Skin is warm and dry. She is not diaphoretic.  Psychiatric: She has a normal mood and affect. Her behavior is normal. Thought content  normal.    ED Course  Procedures (including critical care time) Labs Review Labs Reviewed  POC URINE PREG, ED    Imaging Review No results found.   EKG Interpretation None      MDM   Final diagnoses:  Migraine   23yF with headache. Suspect primary HA. Consider emergent secondary causes such as bleed, infectious or mass but doubt. There is no history of trauma. Pt has a nonfocal neurological exam. Afebrile and neck supple. No use of blood thinning medication. Consider ocular etiology such as acute angle closure glaucoma but doubt. Pt denies acute change in visual acuity and eye exam unremarkable. . Doubt CO poisoning. No contacts with similar symptoms. Doubt venous thrombosis. Doubt carotid or vertebral arteries dissection. Symptoms improved with meds. Feel that can be safely discharged, but strict return precautions discussed. Outpt fu.     Raeford RazorStephen Kohen Reither, MD 08/30/13 2047

## 2013-09-30 ENCOUNTER — Encounter (HOSPITAL_COMMUNITY): Payer: Self-pay | Admitting: Emergency Medicine

## 2013-09-30 DIAGNOSIS — R51 Headache: Secondary | ICD-10-CM | POA: Insufficient documentation

## 2013-09-30 MED ORDER — ONDANSETRON 4 MG PO TBDP
8.0000 mg | ORAL_TABLET | Freq: Once | ORAL | Status: AC
  Administered 2013-09-30: 8 mg via ORAL
  Filled 2013-09-30: qty 2

## 2013-09-30 NOTE — ED Notes (Addendum)
Last HA/ migraine 1 year ago. C/o sudden onset HA today after her 1 year PCP check, picked 23 year old child up with L arm and developed pain in R neck pain, radiated to R shoulder, R face and R head, also felt dizzy, and generally weak. "hurts to move R eye and R cheek. Some nausea in throat. Occurred around 1700. Also sx remain. C/c is pain. Pinpoints the most pain to behind R ear. A&Ox4, PERRL 65mm brisk, MAEx4, no droop or drift, speech clear. No obvious light sensitivity. Grip strength and coordination are equal and strong. CMS intact. Took 1000mg  tylenol PTA at 1830. Brought in by mother. Steady gait.

## 2013-10-01 ENCOUNTER — Encounter (HOSPITAL_COMMUNITY): Payer: Self-pay | Admitting: Emergency Medicine

## 2013-10-01 ENCOUNTER — Emergency Department (HOSPITAL_COMMUNITY)
Admission: EM | Admit: 2013-10-01 | Discharge: 2013-10-01 | Disposition: A | Discharge status: Home / Self Care | Payer: Medicaid Other | Attending: Emergency Medicine | Admitting: Emergency Medicine

## 2013-10-01 DIAGNOSIS — R519 Headache, unspecified: Secondary | ICD-10-CM

## 2013-10-01 DIAGNOSIS — Z8679 Personal history of other diseases of the circulatory system: Secondary | ICD-10-CM | POA: Insufficient documentation

## 2013-10-01 DIAGNOSIS — R51 Headache: Secondary | ICD-10-CM | POA: Insufficient documentation

## 2013-10-01 MED ORDER — DIPHENHYDRAMINE HCL 50 MG/ML IJ SOLN
25.0000 mg | Freq: Once | INTRAMUSCULAR | Status: AC
  Administered 2013-10-01: 25 mg via INTRAVENOUS
  Filled 2013-10-01: qty 1

## 2013-10-01 MED ORDER — METOCLOPRAMIDE HCL 5 MG/ML IJ SOLN
10.0000 mg | Freq: Once | INTRAMUSCULAR | Status: AC
  Administered 2013-10-01: 10 mg via INTRAVENOUS
  Filled 2013-10-01: qty 2

## 2013-10-01 MED ORDER — KETOROLAC TROMETHAMINE 30 MG/ML IJ SOLN
30.0000 mg | Freq: Once | INTRAMUSCULAR | Status: AC
  Administered 2013-10-01: 30 mg via INTRAVENOUS
  Filled 2013-10-01: qty 1

## 2013-10-01 MED ORDER — SODIUM CHLORIDE 0.9 % IV BOLUS (SEPSIS)
1000.0000 mL | Freq: Once | INTRAVENOUS | Status: AC
  Administered 2013-10-01: 1000 mL via INTRAVENOUS

## 2013-10-01 NOTE — ED Notes (Signed)
Patient up to bathroom

## 2013-10-01 NOTE — Discharge Instructions (Signed)
Migraine Headache A migraine headache is an intense, throbbing pain on one or both sides of your head. A migraine can last for 30 minutes to several hours. CAUSES  The exact cause of a migraine headache is not always known. However, a migraine may be caused when nerves in the brain become irritated and release chemicals that cause inflammation. This causes pain. Certain things may also trigger migraines, such as:  Alcohol.  Smoking.  Stress.  Menstruation.  Aged cheeses.  Foods or drinks that contain nitrates, glutamate, aspartame, or tyramine.  Lack of sleep.  Chocolate.  Caffeine.  Hunger.  Physical exertion.  Fatigue.  Medicines used to treat chest pain (nitroglycerine), birth control pills, estrogen, and some blood pressure medicines. SIGNS AND SYMPTOMS  Pain on one or both sides of your head.  Pulsating or throbbing pain.  Severe pain that prevents daily activities.  Pain that is aggravated by any physical activity.  Nausea, vomiting, or both.  Dizziness.  Pain with exposure to bright lights, loud noises, or activity.  General sensitivity to bright lights, loud noises, or smells. Before you get a migraine, you may get warning signs that a migraine is coming (aura). An aura may include:  Seeing flashing lights.  Seeing bright spots, halos, or zig-zag lines.  Having tunnel vision or blurred vision.  Having feelings of numbness or tingling.  Having trouble talking.  Having muscle weakness. DIAGNOSIS  A migraine headache is often diagnosed based on:  Symptoms.  Physical exam.  A CT scan or MRI of your head. These imaging tests cannot diagnose migraines, but they can help rule out other causes of headaches. TREATMENT Medicines may be given for pain and nausea. Medicines can also be given to help prevent recurrent migraines.  HOME CARE INSTRUCTIONS  Only take over-the-counter or prescription medicines for pain or discomfort as directed by your  health care provider. The use of long-term narcotics is not recommended.  Lie down in a dark, quiet room when you have a migraine.  Keep a journal to find out what may trigger your migraine headaches. For example, write down:  What you eat and drink.  How much sleep you get.  Any change to your diet or medicines.  Limit alcohol consumption.  Quit smoking if you smoke.  Get 7 9 hours of sleep, or as recommended by your health care provider.  Limit stress.  Keep lights dim if bright lights bother you and make your migraines worse. SEEK IMMEDIATE MEDICAL CARE IF:   Your migraine becomes severe.  You have a fever.  You have a stiff neck.  You have vision loss.  You have muscular weakness or loss of muscle control.  You start losing your balance or have trouble walking.  You feel faint or pass out.  You have severe symptoms that are different from your first symptoms. MAKE SURE YOU:   Understand these instructions.  Will watch your condition.  Will get help right away if you are not doing well or get worse. Document Released: 04/09/2005 Document Revised: 01/28/2013 Document Reviewed: 12/15/2012 [employer] Patient Information 2014 [employer].  

## 2013-10-01 NOTE — ED Notes (Signed)
Per pt, states she has had a headache for 2 days-went to [facility] yesterday for the same symptoms but left because the wait was too long

## 2013-10-01 NOTE — ED Provider Notes (Signed)
Medical screening examination/treatment/procedure(s) were performed by non-physician practitioner and as supervising physician I was immediately available for consultation/collaboration.   EKG Interpretation None        Kanesha Cadle, MD 10/01/13 1544 

## 2013-10-01 NOTE — ED Provider Notes (Signed)
CSN: [id number]     Arrival date & time 10/01/13  1117 History   First MD Initiated Contact with Patient 10/01/13 1339     Chief Complaint  Patient presents with  . Headache     (Consider location/radiation/quality/duration/timing/severity/associated sxs/prior Treatment) HPI Comments: The patient presents to the emergency department with chief complaint of headache. She states that the headache has been going on for the past 2 days. She states it has been progressively worsening. She reports a history of headaches. She states that this is similar. She states it is located on the right side of her head. She denies any fevers, chills, neck stiffness, vision change, or other symptoms. She has not tried taking anything to alleviate her symptoms.  The history is provided by the patient. No language interpreter was used.    Past Medical History  Diagnosis Date  . Migraine    Past Surgical History  Procedure Laterality Date  . No past surgeries    . Cesarean section N/A 10/17/2012    Procedure: CESAREAN SECTION;  Surgeon: Tereso NewcomerUgonna A Anyanwu, MD;  Location: [facility];  Service: Obstetrics;  Laterality: N/A;   Family History  Problem Relation Age of Onset  . Other Neg Hx   . Alcohol abuse Neg Hx   . Arthritis Neg Hx   . Asthma Neg Hx   . Birth defects Neg Hx   . Cancer Neg Hx   . COPD Neg Hx   . Depression Neg Hx   . Diabetes Neg Hx   . Drug abuse Neg Hx   . Early death Neg Hx   . Hearing loss Neg Hx   . Heart disease Neg Hx   . Hyperlipidemia Neg Hx   . Hypertension Neg Hx   . Kidney disease Neg Hx   . Learning disabilities Neg Hx   . Mental illness Neg Hx   . Mental retardation Neg Hx   . Miscarriages / Stillbirths Neg Hx   . Stroke Neg Hx   . Vision loss Neg Hx    History  Substance Use Topics  . Smoking status: Never Smoker   . Smokeless tobacco: Never Used  . Alcohol Use: No   OB History   Grav Para Term Preterm Abortions TAB SAB Ect Mult Living   3 2 2  1  1   2       Review of Systems  Constitutional: Negative for fever and chills.  Respiratory: Negative for shortness of breath.   Cardiovascular: Negative for chest pain.  Gastrointestinal: Negative for nausea, vomiting, diarrhea and constipation.  Genitourinary: Negative for dysuria.  Neurological: Positive for headaches.  All other systems reviewed and are negative.     Allergies  Review of patient's allergies indicates no known allergies.  Home Medications   Prior to Admission medications   Medication Sig Start Date End Date Taking? Authorizing Provider  acetaminophen (TYLENOL) 500 MG tablet Take 1,000 mg by mouth every 4 (four) hours as needed for headache.    Yes Historical Provider, MD   BP 102/62  Pulse 64  Temp(Src) 98.4 F (36.9 C) (Oral)  Resp 18  SpO2 98% Physical Exam  Nursing note and vitals reviewed. Constitutional: She is oriented to person, place, and time. She appears well-developed and well-nourished.  HENT:  Head: Normocephalic and atraumatic.  Right Ear: External ear normal.  Left Ear: External ear normal.  Eyes: Conjunctivae and EOM are normal. Pupils are equal, round, and reactive to light.  Neck: Normal range  of motion. Neck supple.  No pain with neck flexion, no meningismus  Cardiovascular: Normal rate, regular rhythm and normal heart sounds.  Exam reveals no gallop and no friction rub.   No murmur heard. Pulmonary/Chest: Effort normal and breath sounds normal. No respiratory distress. She has no wheezes. She has no rales. She exhibits no tenderness.  Abdominal: Soft. She exhibits no distension and no mass. There is no tenderness. There is no rebound and no guarding.  Musculoskeletal: Normal range of motion. She exhibits no edema and no tenderness.  Normal gait.  Neurological: She is alert and oriented to person, place, and time. She has normal reflexes.  CN 3-12 intact, normal finger to nose, no pronator drift, sensation and strength intact bilaterally.   Skin: Skin is warm and dry.  Psychiatric: She has a normal mood and affect. Her behavior is normal. Judgment and thought content normal.    ED Course  Procedures (including critical care time) Labs Review Labs Reviewed - No data to display  Imaging Review No results found.   EKG Interpretation None      MDM   Final diagnoses:  Headache    Pt HA treated and improved while in ED.  Presentation is like pts typical HA and non concerning for [facility], [facility], Meningitis, or temporal arteritis. Pt is afebrile with no focal neuro deficits, nuchal rigidity, or change in vision. Pt is to follow up with PCP to discuss prophylactic medication. Pt verbalizes understanding and is agreeable with plan to dc.      Roxy Horseman, PA-C 10/01/13 1525

## 2013-12-30 ENCOUNTER — Emergency Department (HOSPITAL_COMMUNITY)
Admission: EM | Admit: 2013-12-30 | Discharge: 2013-12-31 | Disposition: A | Discharge status: Home / Self Care | Payer: Medicaid Other | Attending: Emergency Medicine | Admitting: Emergency Medicine

## 2013-12-30 ENCOUNTER — Encounter (HOSPITAL_COMMUNITY): Payer: Self-pay | Admitting: Emergency Medicine

## 2013-12-30 ENCOUNTER — Emergency Department (HOSPITAL_COMMUNITY): Payer: Medicaid Other

## 2013-12-30 DIAGNOSIS — Z8669 Personal history of other diseases of the nervous system and sense organs: Secondary | ICD-10-CM | POA: Insufficient documentation

## 2013-12-30 DIAGNOSIS — N949 Unspecified condition associated with female genital organs and menstrual cycle: Secondary | ICD-10-CM | POA: Insufficient documentation

## 2013-12-30 DIAGNOSIS — B9689 Other specified bacterial agents as the cause of diseases classified elsewhere: Secondary | ICD-10-CM | POA: Diagnosis not present

## 2013-12-30 DIAGNOSIS — Z3202 Encounter for pregnancy test, result negative: Secondary | ICD-10-CM | POA: Insufficient documentation

## 2013-12-30 DIAGNOSIS — N760 Acute vaginitis: Secondary | ICD-10-CM | POA: Diagnosis not present

## 2013-12-30 DIAGNOSIS — R102 Pelvic and perineal pain: Secondary | ICD-10-CM

## 2013-12-30 DIAGNOSIS — A499 Bacterial infection, unspecified: Secondary | ICD-10-CM | POA: Insufficient documentation

## 2013-12-30 LAB — WET PREP, GENITAL
TRICH WET PREP: NONE SEEN
YEAST WET PREP: NONE SEEN

## 2013-12-30 LAB — URINALYSIS, ROUTINE W REFLEX MICROSCOPIC
Bilirubin Urine: NEGATIVE
GLUCOSE, UA: NEGATIVE mg/dL
HGB URINE DIPSTICK: NEGATIVE
Ketones, ur: NEGATIVE mg/dL
LEUKOCYTES UA: NEGATIVE
Nitrite: NEGATIVE
PROTEIN: NEGATIVE mg/dL
SPECIFIC GRAVITY, URINE: 1.023 (ref 1.005–1.030)
UROBILINOGEN UA: 0.2 mg/dL (ref 0.0–1.0)
pH: 5.5 (ref 5.0–8.0)

## 2013-12-30 LAB — POC URINE PREG, ED: Preg Test, Ur: NEGATIVE

## 2013-12-30 MED ORDER — NAPROXEN 250 MG PO TABS: 250.0000 mg | ORAL_TABLET | Freq: Two times a day (BID) | ORAL | Status: DC | PRN

## 2013-12-30 MED ORDER — HYDROCODONE-ACETAMINOPHEN 5-325 MG PO TABS: ORAL_TABLET | ORAL | Status: DC

## 2013-12-30 MED ORDER — OXYCODONE-ACETAMINOPHEN 5-325 MG PO TABS
1.0000 | ORAL_TABLET | Freq: Once | ORAL | Status: AC
  Administered 2013-12-30: 1 via ORAL
  Filled 2013-12-30: qty 1

## 2013-12-30 MED ORDER — METRONIDAZOLE 500 MG PO TABS: 500.0000 mg | ORAL_TABLET | Freq: Two times a day (BID) | ORAL | Status: DC

## 2013-12-30 MED ORDER — KETOROLAC TROMETHAMINE 60 MG/2ML IM SOLN
60.0000 mg | Freq: Once | INTRAMUSCULAR | Status: AC
  Administered 2013-12-30: 60 mg via INTRAMUSCULAR
  Filled 2013-12-30: qty 2

## 2013-12-30 NOTE — ED Notes (Signed)
Patient c/o left pelvic pain radiating to rib cage, states this is ongoing x1 week. Patient denies seeking any prior treatment, states she has been taking tylenol at home and using heat packs without relief. Denies urinary symptoms or discharge.

## 2013-12-30 NOTE — Discharge Instructions (Signed)
°Emergency Department Resource Guide °1) Find a Doctor and Pay Out of Pocket °Although you won't have to find out who is covered by your insurance plan, it is a good idea to ask around and get recommendations. You will then need to call the office and see if the doctor you have chosen will accept you as a new patient and what types of options they offer for patients who are self-pay. Some doctors offer discounts or will set up payment plans for their patients who do not have insurance, but you will need to ask so you aren't surprised when you get to your appointment. ° °2) Contact Your Local Health Department °Not all health departments have doctors that can see patients for sick visits, but many do, so it is worth a call to see if yours does. If you don't know where your local health department is, you can check in your phone book. The CDC also has a tool to help you locate your state's health department, and many state websites also have listings of all of their local health departments. ° °3) Find a Walk-in Clinic °If your illness is not likely to be very severe or complicated, you may want to try a walk in clinic. These are popping up all over the country in pharmacies, drugstores, and shopping centers. They're usually staffed by nurse practitioners or physician assistants that have been trained to treat common illnesses and complaints. They're usually fairly quick and inexpensive. However, if you have serious medical issues or chronic medical problems, these are probably not your best option. ° °No Primary Care Doctor: °- Call [facility] at  832-8000 - they can help you locate a primary care doctor that  accepts your insurance, provides certain services, etc. °- Physician Referral Service- 1-800-533-3463 ° °Chronic Pain Problems: °Organization         Address  Phone   Notes  °[facility]  (336) 297-2271 Patients need to be referred by their primary care doctor.  ° °Medication  Assistance: °Organization         Address  Phone   Notes  °[place] [facility] [address] °[place], [place] [address] (336) 641-8030 --Must be a resident of [place] °-- Must have NO insurance coverage whatsoever (no Medicaid/ Medicare, etc.) °-- The pt. MUST have a primary care doctor that directs their care regularly and follows them in the community °  °[facility]  (866) 331-1348   °[employer]  (888) 892-1162   ° °Agencies that provide inexpensive medical care: °Organization         Address  Phone   Notes  °[facility]  (336) 832-8035   °[facility]    (336) 832-7272   °[facility] [address] °[place], [place] [address] (336) 832-4777   °[facility] [address], °[place] (336) 271-4999   °[facility]    (336) 373-0678   °[facility]    (336) 272-1050   °[facility] ° [address], [place] Phone:  (336) 832-4444, Fax:  (336) 832-4440 Hours of Operation:  9 am - 6 pm, M-F.  Also accepts Medicaid/Medicare and self-pay.  °[facility] ° [address], [place] Phone: (336) 832-3150, Fax: (336) 832-3151. Hours of Operation:  8:30 am - 5:30 pm, M-F.  Also accepts Medicaid and self-pay.  °[facility] [place] [address], [place] Phone: (336) 878-6027   °[facility] [address], [place], [place] (336)723-1848, Ext. 123 Mondays & Thursdays: 7-9 AM.  First 15 patients are seen on a first come, first serve basis. °  ° °Medicaid-accepting [place] Providers: ° °Organization         Address  Phone   Notes  °[facility] [address], [place] (336) 641-2100 Also accepts self-pay patients.  °[facility] [address], [place] ° (336) 856-9996   °[facility] [address], [place]  (336) 288-8857   °[facility] [address] [place] [address], [place] (336) 299-7000   °Veita Bland [address], [place]  ° (336) 373-1557 Only accepts [place] Access Medicaid patients after they have their name applied to their card.  ° °Self-Pay (no insurance) in [place]: ° °Organization         Address  Phone   Notes  °Sickle Cell Patients, [facility] [address], [place] (336) 832-1970   °[facility] [address], [place] (336) 832-4400   °[facility] ° [address], [place] (336) 992-4800   °[facility]/Dr. Osei-Bonsu ° [address] [place] [address], [place] or [address], [place] (336) 841-8500 Phone number for both [place] and [place] locations is the same.  °[facility] [address], [place] (336) 299-0000   °[facility] [address] [place] [address], [place] or [address] (336) 852-7530 °(336) 878-2260   °[facility] [address], [place] (336) 350-1642, phone; (336) 294-5005, fax Sees patients 1st and 3rd Saturday of every month.  Must not qualify for public or private insurance (i.e. Medicaid, Medicare, [place] Health Choice, Veterans' Benefits) • Household income should be no more than 200% of the poverty level •The clinic cannot treat you if you are pregnant or think you are pregnant • Sexually transmitted diseases are not treated at the clinic.  ° ° °Dental Care: °Organization         Address  Phone  Notes  °[place] [facility] [address], [place] (336) 641-6152 Accepts children up to age 21 who are enrolled in Medicaid or [place] Health Choice; pregnant women with a Medicaid card; and children who have applied for Medicaid or [place] Health Choice, but were declined, whose parents can pay a reduced fee at time of service.  °[place]  [facility] [place]  [address], [place] (336) 641-7733 Accepts children up to age 21 who are enrolled in Medicaid or [place] Health Choice; pregnant women with a Medicaid card; and children who have applied for Medicaid or [place] Health Choice, but were declined, whose parents can pay a reduced fee at time of service.  °[facility] ° [address], [place] (336) 641-4533 Patients are seen by appointment only. Walk-ins are not accepted. [facility] will see patients 18 years of age and older. °Monday - Tuesday (8am-5pm) °Most Wednesdays (8:30-5pm) °$30 per visit, cash only  °[facility] ° [address], [place] (336) 641-4533 Patients are seen by appointment only. Walk-ins are not accepted. [facility] will see patients 18 years of age and older. °One   Wednesday Evening (Monthly: Volunteer Based).  $30 per visit, cash only  °[facility]  (919) 537-3737 for adults; Children under age 4, call Graduate Pediatric Dentistry at (919) [phone]. Children aged 4-14, please call (919) [phone] to request a pediatric application. ° Dental services are provided in all areas of dental care including fillings, crowns and bridges, complete and partial dentures, implants, gum treatment, root canals, and extractions. Preventive care is also provided. Treatment is provided to both adults and children. °Patients are selected via a lottery and there is often a waiting list. °  °[facility] [address], °[place] ° (336) 763-8833 [email or web address] °  °[facility] [address], [place], [place] (336)723-1848, Ext. 123 Second and Fourth Thursday of each month, opens at 6:30 AM; Clinic ends at 9 AM.  Patients are seen on a first-come first-served basis, and a limited number are seen during each clinic.  ° °[facility] ° [address], [place], [place] (336) 723-7904    Eligibility Requirements °You must have lived in [place], or [place] counties for at least the last three months. °  You cannot be eligible for state or federal sponsored healthcare insurance, including Veterans Administration, Medicaid, or Medicare. °  You generally cannot be eligible for healthcare insurance through your employer.  °  How to apply: °Eligibility screenings are held every Tuesday and Wednesday afternoon from 1:00 pm until 4:00 pm. You do not need an appointment for the interview!  °[facility] [address], [place], [place] 336-631-2330   °[facility]  336-342-8273   °[facility]  336-703-3100   °[facility]  336-570-6415   ° °Behavioral Health Resources in the Community: °Intensive Outpatient Programs °Organization         Address  Phone  Notes  °[facility] [address], [place], [place] 336-878-6098   °[facility] [address], [place], [place] 336-832-9800   °[facility] [address], [place], [place] ° 336-882-2125   °[facility] [address],  °[place], [place] 1-800-853-5163 or 336-641-4981   °Substance Abuse Resources °Organization         Address  Phone  Notes  °[facility]  336-882-2125   °[facility]  336-784-9470   °[facility]  336-285-9073   °[facility]  336-845-3988   °Residential & Outpatient Substance Abuse Program  1-800-659-3381   °Psychological Services °Organization         Address  Phone  Notes  °[facility]  336- 832-9600   °[facility]  336- 378-7881   °[facility] [address], [place] 1-800-853-5163 or 336-641-4981   ° °Mobile Crisis Teams °Organization         Address  Phone  Notes  °[facility], Mobile Crisis Care Unit  1-877-626-1772   °Assertive °[facility] ° [address]  [place], [place] 336-834-9664   °Sharon DeEsch [address] °[place] 336-554-5454   ° °Self-Help/Support Groups °Organization         Address  Phone             Notes  °[facility]  - variety of support groups  336- 373-1402 Call for more information  °Narcotics Anonymous (NA), [facility] [address], °[place]  2 meetings at this location  ° °  Residential Treatment Programs °Organization         Address  Phone  Notes  °[facility] [address],    °[place]  1-866-801-8205   °[facility] ° [address], [place], [place] 704-293-8524   °[facility] [address], [place] 336-845-3988 Admissions: 8am-3pm M-F  °[facility] [address]    °[place], [place] 336-841-1104   °[facility] [address], [place], [place] 336-379-7146   °[facility] [address]  °[place], [place] 336-285-9073   °[facility] Intensive Outpatient [address], [place], [place] 336-852-3033   °[facility] [address]  °[place], [place] 1-877-615-2722 or 336-784-9470   °[facility] [address] [place], [place] 336-227-7417 Accepts Medicaid  °[facility] [address]  °[place] 1-800-659-3381 Substance Abuse/Addiction Treatment  ° °[place] Behavioral Health Resources °Organization         Address  Phone  Notes  °[facility]  (888) 581-9988   °Julie Brannon, PhD [address] [place], [place]   (336) 349-5553 or (336) 951-0000   °[facility]   [address] °[place], [place] (336) 349-4454   °[facility] [address], [place], [place] (336) 342-8316 Insurance/Medicaid/sponsorship through [facility]  °[facility] [address]                                    [place], [place] (336) 342-8316 Therapy/tele-psych/case    °[facility] [address]  ° [place], [place] (336) 349-2233    °Dr. Arfeen  (336) 349-4544   °[facility] [place]  [employer] [place] [facility] 1) [address], [place] °2) [address], [place] °3)  [address], [place] (336) 349-3220 °(336) 342-7768 ° °(336) 342-8140   °[place] [facility] (336) 342-1394 or (336) 342-3537 (After Hours)    ° ° °Take the prescriptions as directed.  Apply moist heat to the area(s) of discomfort, for 15 minutes at a time, several times per day for the next few days.  Do not fall asleep on a heating pack.  Call your regular OB/GYN doctor tomorrow to schedule a follow up appointment within the next week.  Return to the Emergency Department immediately if worsening. ° °

## 2013-12-30 NOTE — ED Notes (Signed)
US at bedside

## 2013-12-30 NOTE — ED Provider Notes (Signed)
CSN: [id number]     Arrival date & time 12/30/13  2050 History   First MD Initiated Contact with Patient 12/30/13 2127     Chief Complaint  Patient presents with  . Pelvic Pain    left sided, radiating toward ribs      HPI Pt was seen at 2135.  Per pt, c/o gradual onset and persistence of constant left sided pelvic "pain" for the past 1 week. Describes the pelvic pain as "sharp," worsens with palpation of the area and walking.  Denies N/V, no diarrhea, no fevers, no back pain, no rash, no CP/SOB, no black or blood in stools, no dysuria/hematuria, no vaginal bleeding/discharge.       Past Medical History  Diagnosis Date  . Migraine    Past Surgical History  Procedure Laterality Date  . Cesarean section N/A 10/17/2012    Procedure: CESAREAN SECTION;  Surgeon: Tereso Newcomer, MD;  Location: [facility];  Service: Obstetrics;  Laterality: N/A;   Family History  Problem Relation Age of Onset  . Other Neg Hx   . Alcohol abuse Neg Hx   . Arthritis Neg Hx   . Asthma Neg Hx   . Birth defects Neg Hx   . Cancer Neg Hx   . COPD Neg Hx   . Depression Neg Hx   . Diabetes Neg Hx   . Drug abuse Neg Hx   . Early death Neg Hx   . Hearing loss Neg Hx   . Heart disease Neg Hx   . Hyperlipidemia Neg Hx   . Hypertension Neg Hx   . Kidney disease Neg Hx   . Learning disabilities Neg Hx   . Mental illness Neg Hx   . Mental retardation Neg Hx   . Miscarriages / Stillbirths Neg Hx   . Stroke Neg Hx   . Vision loss Neg Hx    History  Substance Use Topics  . Smoking status: Never Smoker   . Smokeless tobacco: Never Used  . Alcohol Use: No   OB History   Grav Para Term Preterm Abortions TAB SAB Ect Mult Living   Review of Systems ROS: Statement: All systems negative except as marked or noted in the HPI; Constitutional: Negative for fever and chills. ; ; Eyes: Negative for eye pain, redness and discharge. ; ; ENMT: Negative for ear pain, hoarseness, nasal congestion,  sinus pressure and sore throat. ; ; Cardiovascular: Negative for chest pain, palpitations, diaphoresis, dyspnea and peripheral edema. ; ; Respiratory: Negative for cough, wheezing and stridor. ; ; Gastrointestinal: Negative for nausea, vomiting, diarrhea, abdominal pain, blood in stool, hematemesis, jaundice and rectal bleeding. . ; ; Genitourinary: Negative for dysuria, flank pain and hematuria. ; ; GYN:  +pelvic pain. No vaginal bleeding, no vaginal discharge, no vulvar pain. ;; Musculoskeletal: Negative for back pain and neck pain. Negative for swelling and trauma.; ; Skin: Negative for pruritus, rash, abrasions, blisters, bruising and skin lesion.; ; Neuro: Negative for headache, lightheadedness and neck stiffness. Negative for weakness, altered level of consciousness , altered mental status, extremity weakness, paresthesias, involuntary movement, seizure and syncope.     Allergies  Review of patient's allergies indicates no known allergies.  Home Medications   Prior to Admission medications   Medication Sig Start Date End Date Taking? Authorizing Provider  acetaminophen (TYLENOL) 500 MG tablet Take 1,000 mg by mouth every 4 (four) hours as needed for  mild pain or headache.    Yes Historical Provider, MD  medroxyPROGESTERone (DEPO-PROVERA) 150 MG/ML injection Inject 150 mg into the muscle every 3 (three) months.   Yes Historical Provider, MD   BP 115/64  Pulse 93  Temp(Src) 99.4 F (37.4 C) (Oral)  Resp 16  SpO2 97% Physical Exam 2140: Physical examination:  Nursing notes reviewed; Vital signs and O2 SAT reviewed;  Constitutional: Well developed, Well nourished, Well hydrated, In no acute distress; Head:  Normocephalic, atraumatic; Eyes: EOMI, PERRL, No scleral icterus; ENMT: Mouth and pharynx normal, Mucous membranes moist; Neck: Supple, Full range of motion, No lymphadenopathy; Cardiovascular: Regular rate and rhythm, No murmur, rub, or gallop; Respiratory: Breath sounds clear & equal  bilaterally, No rales, rhonchi, wheezes.  Speaking full sentences with ease, Normal respiratory effort/excursion; Chest: Nontender, Movement normal; Abdomen: Soft, +LLQ tenderness to palp. No rebound or guarding. Nondistended, Normal bowel sounds; Genitourinary: No CVA tenderness. Pelvic exam performed with permission of pt and female ED tech assist during exam.  External genitalia w/o lesions. Vaginal vault without discharge.  Cervix w/o lesions, not friable, GC/chlam and wet prep obtained and sent to lab.  Bimanual exam w/o CMT, uterine or right adnexal tenderness. +left adnexal tenderness.;; Extremities: Pulses normal, No tenderness, No edema, No calf edema or asymmetry.; Neuro: AA&Ox3, Major CN grossly intact.  Speech clear. No gross focal motor or sensory deficits in extremities.; Skin: Color normal, Warm, Dry.   ED Course  Procedures     MDM  MDM Reviewed: previous chart, nursing note and vitals Reviewed previous: labs Interpretation: labs and ultrasound    Results for orders placed during the hospital encounter of 12/30/13  WET PREP, GENITAL      Result Value Ref Range   Yeast Wet Prep HPF POC NONE SEEN  NONE SEEN   Trich, Wet Prep NONE SEEN  NONE SEEN   Clue Cells Wet Prep HPF POC FEW (*) NONE SEEN   WBC, Wet Prep HPF POC RARE (*) NONE SEEN  URINALYSIS, ROUTINE W REFLEX MICROSCOPIC      Result Value Ref Range   Color, Urine YELLOW  YELLOW   APPearance CLEAR  CLEAR   Specific Gravity, Urine 1.023  1.005 - 1.030   pH 5.5  5.0 - 8.0   Glucose, UA NEGATIVE  NEGATIVE mg/dL   Hgb urine dipstick NEGATIVE  NEGATIVE   Bilirubin Urine NEGATIVE  NEGATIVE   Ketones, ur NEGATIVE  NEGATIVE mg/dL   Protein, ur NEGATIVE  NEGATIVE mg/dL   Urobilinogen, UA 0.2  0.0 - 1.0 mg/dL   Nitrite NEGATIVE  NEGATIVE   Leukocytes, UA NEGATIVE  NEGATIVE  POC URINE PREG, ED      Result Value Ref Range   Preg Test, Ur NEGATIVE  NEGATIVE   [place] Art/ven Flow Abd Pelv Doppler 12/30/2013   CLINICAL DATA:   Left pelvic pain, evaluate for torsion  EXAM: TRANSABDOMINAL AND TRANSVAGINAL ULTRASOUND OF PELVIS  DOPPLER ULTRASOUND OF OVARIES  TECHNIQUE: Both transabdominal and transvaginal ultrasound examinations of the pelvis were performed. Transabdominal technique was performed for global imaging of the pelvis including uterus, ovaries, adnexal regions, and pelvic cul-de-sac.  It was necessary to proceed with endovaginal exam following the transabdominal exam to visualize the uterus and bilateral ovaries. Color and duplex Doppler ultrasound was utilized to evaluate blood flow to the ovaries.  COMPARISON:  None.  FINDINGS: Uterus  Measurements: 6.1 x 4.0 x 4.7 cm. No fibroids or other mass visualized.  Endometrium  Thickness: 7 mm.  No focal abnormality visualized.  Right ovary  Measurements: 3.6 x 2.0 x 1.8 cm. Normal appearance/no adnexal mass.  Left ovary  Measurements: 3.3 x 1.8 x 2.4 cm. Normal appearance/no adnexal mass.  Pulsed Doppler evaluation of both ovaries demonstrates normal low-resistance arterial and venous waveforms.  Other findings  No free fluid.  IMPRESSION: Negative pelvic ultrasound.  No evidence of ovarian torsion.   Electronically Signed   By: Charline Bills M.D.   On: 12/30/2013 23:16    2335:  Will tx for BV. Pt feels better after meds and wants to go home now. Dx and testing d/w pt and family.  Questions answered.  Verb understanding, agreeable to d/c home with outpt f/u.   Samuel Jester, DO 01/01/14 1445

## 2013-12-31 LAB — GC/CHLAMYDIA PROBE AMP
CT PROBE, AMP APTIMA: NEGATIVE
GC Probe RNA: NEGATIVE

## 2014-02-22 ENCOUNTER — Encounter (HOSPITAL_COMMUNITY): Payer: Self-pay | Admitting: Emergency Medicine

## 2014-05-14 ENCOUNTER — Emergency Department (HOSPITAL_COMMUNITY)
Admission: EM | Admit: 2014-05-14 | Discharge: 2014-05-14 | Disposition: A | Discharge status: Home / Self Care | Payer: Medicaid Other | Attending: Emergency Medicine | Admitting: Emergency Medicine

## 2014-05-14 ENCOUNTER — Encounter (HOSPITAL_COMMUNITY): Payer: Self-pay | Admitting: Emergency Medicine

## 2014-05-14 DIAGNOSIS — Z791 Long term (current) use of non-steroidal anti-inflammatories (NSAID): Secondary | ICD-10-CM | POA: Diagnosis not present

## 2014-05-14 DIAGNOSIS — Z3202 Encounter for pregnancy test, result negative: Secondary | ICD-10-CM | POA: Diagnosis not present

## 2014-05-14 DIAGNOSIS — N898 Other specified noninflammatory disorders of vagina: Secondary | ICD-10-CM | POA: Diagnosis present

## 2014-05-14 DIAGNOSIS — Z8679 Personal history of other diseases of the circulatory system: Secondary | ICD-10-CM | POA: Diagnosis not present

## 2014-05-14 DIAGNOSIS — R300 Dysuria: Secondary | ICD-10-CM | POA: Insufficient documentation

## 2014-05-14 DIAGNOSIS — Z792 Long term (current) use of antibiotics: Secondary | ICD-10-CM | POA: Diagnosis not present

## 2014-05-14 DIAGNOSIS — Z79899 Other long term (current) drug therapy: Secondary | ICD-10-CM | POA: Insufficient documentation

## 2014-05-14 LAB — WET PREP, GENITAL
Clue Cells Wet Prep HPF POC: NONE SEEN
Trich, Wet Prep: NONE SEEN

## 2014-05-14 LAB — URINE MICROSCOPIC-ADD ON

## 2014-05-14 LAB — URINALYSIS, ROUTINE W REFLEX MICROSCOPIC
BILIRUBIN URINE: NEGATIVE
Glucose, UA: NEGATIVE mg/dL
Hgb urine dipstick: NEGATIVE
Ketones, ur: NEGATIVE mg/dL
Nitrite: NEGATIVE
PROTEIN: NEGATIVE mg/dL
Specific Gravity, Urine: 1.009 (ref 1.005–1.030)
UROBILINOGEN UA: 0.2 mg/dL (ref 0.0–1.0)
pH: 5.5 (ref 5.0–8.0)

## 2014-05-14 LAB — POC URINE PREG, ED: PREG TEST UR: NEGATIVE

## 2014-05-14 MED ORDER — CEFTRIAXONE SODIUM 250 MG IJ SOLR
250.0000 mg | Freq: Once | INTRAMUSCULAR | Status: AC
  Administered 2014-05-14: 250 mg via INTRAMUSCULAR
  Filled 2014-05-14: qty 250

## 2014-05-14 MED ORDER — FLUCONAZOLE 150 MG PO TABS: 150.0000 mg | ORAL_TABLET | Freq: Once | ORAL | Status: DC

## 2014-05-14 MED ORDER — AZITHROMYCIN 250 MG PO TABS
1000.0000 mg | ORAL_TABLET | Freq: Once | ORAL | Status: AC
  Administered 2014-05-14: 1000 mg via ORAL
  Filled 2014-05-14: qty 4

## 2014-05-14 NOTE — ED Provider Notes (Signed)
CSN: [id number]     Arrival date & time 05/14/14  1740 History   First MD Initiated Contact with Patient 05/14/14 1742     Chief Complaint  Patient presents with  . Vaginal Itching  . Vaginal Discharge     (Consider location/radiation/quality/duration/timing/severity/associated sxs/prior Treatment) HPI Comments: Patient with no pertinent PMH presents to the ED with a chief complaint of vaginal discharge, itching, and dysuria.  She states that the symptoms started 3 weeks ago.  She states that she chose today to come in because the discharge turned brown.  She has tried OTC meds with minimal relief.  She states that she has had vaginal infections in the past.  She reports a moderate of amount of discomfort from the itching with is worsened with walking and sitting.  The history is provided by the patient. No language interpreter was used.    Past Medical History  Diagnosis Date  . Migraine    Past Surgical History  Procedure Laterality Date  . Cesarean section N/A 10/17/2012    Procedure: CESAREAN SECTION;  Surgeon: Tereso Newcomer, MD;  Location: [facility];  Service: Obstetrics;  Laterality: N/A;   Family History  Problem Relation Age of Onset  . Other Neg Hx   . Alcohol abuse Neg Hx   . Arthritis Neg Hx   . Asthma Neg Hx   . Birth defects Neg Hx   . Cancer Neg Hx   . COPD Neg Hx   . Depression Neg Hx   . Diabetes Neg Hx   . Drug abuse Neg Hx   . Early death Neg Hx   . Hearing loss Neg Hx   . Heart disease Neg Hx   . Hyperlipidemia Neg Hx   . Hypertension Neg Hx   . Kidney disease Neg Hx   . Learning disabilities Neg Hx   . Mental illness Neg Hx   . Mental retardation Neg Hx   . Miscarriages / Stillbirths Neg Hx   . Stroke Neg Hx   . Vision loss Neg Hx    History  Substance Use Topics  . Smoking status: Never Smoker   . Smokeless tobacco: Never Used  . Alcohol Use: No   OB History    Gravida Para Term Preterm AB TAB SAB Ectopic Multiple Living   Review of Systems  Constitutional: Negative for fever and chills.  Respiratory: Negative for shortness of breath.   Cardiovascular: Negative for chest pain.  Gastrointestinal: Negative for nausea, vomiting, diarrhea and constipation.  Genitourinary: Positive for dysuria and vaginal discharge.  All other systems reviewed and are negative.     Allergies  Review of patient's allergies indicates no known allergies.  Home Medications   Prior to Admission medications   Medication Sig Start Date End Date Taking? Authorizing Provider  acetaminophen (TYLENOL) 500 MG tablet Take 1,000 mg by mouth every 4 (four) hours as needed for mild pain or headache.     Historical Provider, MD  HYDROcodone-acetaminophen (NORCO/VICODIN) 5-325 MG per tablet 1 or 2 tabs PO q6 hours prn pain 12/30/13   Samuel Jester, DO  medroxyPROGESTERone (DEPO-PROVERA) 150 MG/ML injection Inject 150 mg into the muscle every 3 (three) months.    Historical Provider, MD  metroNIDAZOLE (FLAGYL) 500 MG tablet Take 1 tablet (500 mg total) by mouth 2 (two) times daily. 12/30/13   Samuel Jester, DO  naproxen (NAPROSYN) 250 MG tablet Take  1 tablet (250 mg total) by mouth 2 (two) times daily as needed for mild pain or moderate pain (take with food). 12/30/13   Samuel JesterKathleen McManus, DO   BP 123/65 mmHg  Pulse 74  Temp(Src) 98.3 F (36.8 C) (Oral)  Resp 19  SpO2 96% Physical Exam  Constitutional: She is oriented to person, place, and time. She appears well-developed and well-nourished.  HENT:  Head: Normocephalic and atraumatic.  Eyes: Conjunctivae and EOM are normal. Pupils are equal, round, and reactive to light.  Neck: Normal range of motion. Neck supple.  Cardiovascular: Normal rate and regular rhythm.  Exam reveals no gallop and no friction rub.   No murmur heard. Pulmonary/Chest: Effort normal and breath sounds normal. No respiratory distress. She has no wheezes. She has no rales. She exhibits no tenderness.  Abdominal:  Soft. Bowel sounds are normal. She exhibits no distension and no mass. There is no tenderness. There is no rebound and no guarding.  No focal abdominal tenderness, no RLQ tenderness or pain at McBurney's point, no RUQ tenderness or Murphy's sign, no left-sided abdominal tenderness, no fluid wave, or signs of peritonitis   Genitourinary:  Pelvic exam chaperoned by female ER tech, mild right and left adnexal tenderness, moderate uterine tenderness, mild white vaginal discharge, no bleeding, mild CMT no friability, no foreign body, no injury to the external genitalia, no other significant findings   Musculoskeletal: Normal range of motion. She exhibits no edema or tenderness.  Neurological: She is alert and oriented to person, place, and time.  Skin: Skin is warm and dry.  Psychiatric: She has a normal mood and affect. Her behavior is normal. Judgment and thought content normal.  Nursing note and vitals reviewed.   ED Course  Procedures (including critical care time) Results for orders placed or performed during the hospital encounter of 05/14/14  Wet prep, genital  Result Value Ref Range   Yeast Wet Prep HPF POC FEW (A) NONE SEEN   Trich, Wet Prep NONE SEEN NONE SEEN   Clue Cells Wet Prep HPF POC NONE SEEN NONE SEEN   WBC, Wet Prep HPF POC FEW (A) NONE SEEN  Urinalysis, Routine w reflex microscopic  Result Value Ref Range   Color, Urine YELLOW YELLOW   APPearance CLEAR CLEAR   Specific Gravity, Urine 1.009 1.005 - 1.030   pH 5.5 5.0 - 8.0   Glucose, UA NEGATIVE NEGATIVE mg/dL   Hgb urine dipstick NEGATIVE NEGATIVE   Bilirubin Urine NEGATIVE NEGATIVE   Ketones, ur NEGATIVE NEGATIVE mg/dL   Protein, ur NEGATIVE NEGATIVE mg/dL   Urobilinogen, UA 0.2 0.0 - 1.0 mg/dL   Nitrite NEGATIVE NEGATIVE   Leukocytes, UA TRACE (A) NEGATIVE  Urine microscopic-add on  Result Value Ref Range   Squamous Epithelial / LPF RARE RARE   WBC, UA 0-2 <3 WBC/hpf  POC urine preg, ED [facility])  Result  Value Ref Range   Preg Test, Ur NEGATIVE NEGATIVE   No results found.   Imaging Review No results found.   EKG Interpretation None      MDM   Final diagnoses:  Vaginal discharge    Patient with vaginal discharge.  Pelvic remarkable for moderate uterine tenderness and mild adnexal tenderness bilaterally.  Moderate white vaginal discharge.  Hx of vaginal infection.  Given patient's symptoms of burning dysuria, as well as vaginal discharge. Will treat for presumed STD infection. Urinalysis is negative for infection.     Roxy HorsemanRobert Jailah Willis, PA-C 05/14/14 1916  Geoffery Lyonsouglas Delo,  MD 05/14/14 2238

## 2014-05-14 NOTE — ED Notes (Signed)
Pt c/o vaginal itching, burning that has gotten worse. Pt states that this has been going on for 3 weeks. Pt has tried OTC meds for vaginal itching. Pt states that she has brown vaginal discharge.  Pt states that she tried to call the clinic to get on yesterday but next available appt wasn't until Thursday.

## 2014-05-14 NOTE — ED Notes (Signed)
Patient is alert and oriented x3.  She was given DC instructions and follow up visit instructions.  Patient gave verbal understanding. She was DC ambulatory under her own power to home.  V/S stable.  He was not showing any signs of distress on DC 

## 2014-05-14 NOTE — Discharge Instructions (Signed)
Chlamydia Chlamydia is an infection. It is spread through sexual contact. Chlamydia can be in different areas of the body. These areas include the cervix, urethra, throat, or rectum. You may not know you have chlamydia because many people never develop the symptoms. Chlamydia is not difficult to treat once you know you have it. However, if it is left untreated, chlamydia can lead to more serious health problems.  CAUSES  Chlamydia is caused by bacteria. It is a sexually transmitted disease. It is passed from an infected partner during intimate contact. This contact could be with the genitals, mouth, or rectal area. Chlamydia can also be passed from mothers to babies during birth. SIGNS AND SYMPTOMS  There may not be any symptoms. This is often the case early in the infection. If symptoms develop, they may include:  Mild pain and discomfort when urinating.  Redness, soreness, and swelling (inflammation) of the rectum.  Vaginal discharge.  Painful intercourse.  Abdominal pain.  Bleeding between menstrual periods. DIAGNOSIS  To diagnose this infection, your health care provider will do a pelvic exam. Cultures will be taken of the vagina, cervix, urine, and possibly the rectum to verify the diagnosis.  TREATMENT You will be given antibiotic medicines. If you are pregnant, certain types of antibiotics will need to be avoided. Any sexual partners should also be treated, even if they do not show symptoms.  HOME CARE INSTRUCTIONS   Take your antibiotic medicine as directed by your health care provider. Finish the antibiotic even if you start to feel better.  Take medicines only as directed by your health care provider.  Inform any sexual partners about the infection. They should also be treated.  Do not have sexual contact until your health care provider tells you it is okay.  Get plenty of rest.  Eat a well-balanced diet.  Drink enough fluids to keep your urine clear or pale  yellow.  Keep all follow-up visits as directed by your health care provider. SEEK MEDICAL CARE IF:  You have painful urination.  You have abdominal pain.  You have vaginal discharge.  You have painful sexual intercourse.  You have bleeding between periods and after sex.  You have a fever. SEEK IMMEDIATE MEDICAL CARE IF:   You experience nausea or vomiting.  You experience excessive sweating (diaphoresis).  You have difficulty swallowing. MAKE SURE YOU:   Understand these instructions.  Will watch your condition.  Will get help right away if you are not doing well or get worse. Document Released: 01/17/2005 Document Revised: 08/24/2013 Document Reviewed: 12/15/2012 [facility] Patient Information 2015 [place]. This information is not intended to replace advice given to you by your health care provider. Make sure you discuss any questions you have with your health care provider. Gonorrhea Gonorrhea is an infection that can cause serious problems. If left untreated, the infection may:   Damage the female or female organs.   Cause women to be unable to have children (sterility).   Harm a fetus if the infected woman is pregnant.  It is important to get treatment for gonorrhea as soon as possible. It is also necessary that all your sexual partners be tested for the infection.  CAUSES  Gonorrhea is caused by bacteria called Neisseria gonorrhoeae. The infection is spread from person to person, usually by sexual contact (such as by anal, vaginal, or oral means). A newborn can contract the infection from his or her mother during birth.  SYMPTOMS  Some people with gonorrhea do not have  not have symptoms. Symptoms may be different in females and males.  Females The most common symptoms are:   Pain in the lower abdomen.   Fever with or without chills.  Other symptoms include:   Abnormal vaginal discharge.   Painful intercourse.   Burning or itching of the vagina or  lips of the vagina.   Abnormal vaginal bleeding.   Pain when urinating.   Long-lasting (chronic) pain in the lower abdomen, especially during menstruation or intercourse.   Inability to become pregnant.   Going into premature labor.   Irritation, pain, bleeding, or discharge from the rectum. This may occur if the infection was spread by anal sex.   Sore throat or swollen lymph nodes in the neck. This may occur if the infection was spread by oral sex.  Males The most common symptoms are:   Discharge from the penis.   Pain or burning during urination.   Pain or swelling in the testicles. Other symptoms may include:   Irritation, pain, bleeding, or discharge from the rectum. This may occur if the infection was spread by anal sex.   Sore throat, fever, or swollen lymph nodes in the neck. This may occur if the infection was spread by oral sex.  DIAGNOSIS  A diagnosis is made after a physical exam is done and a sample of discharge is examined under a microscope for the presence of the bacteria. The discharge may be taken from the urethra, cervix, throat, or rectum.  TREATMENT  Gonorrhea is treated with antibiotic medicines. It is important for treatment to begin as soon as possible. Early treatment may prevent some problems from developing.  HOME CARE INSTRUCTIONS   Take medicines only as directed by your health care provider.   Take your antibiotic medicine as directed by your health care provider. Finish the antibiotic even if you start to feel better. Incomplete treatment will put you at risk for continued infection.   Do not have sex until treatment is complete or as directed by your health care provider.   Keep all follow-up visits as directed by your health care provider.   Not all test results are available during your visit. If your test results are not back during the visit, make an appointment with your health care provider to find out the results. Do  not assume everything is normal if you have not heard from your health care provider or the medical facility. It is your responsibility to get your test results.  If you test positive for gonorrhea, inform your recent sexual partners. They need to be checked for gonorrhea even if they do not have symptoms. They may need treatment, even if they test negative for gonorrhea.  SEEK MEDICAL CARE IF:   You develop any bad reaction to the medicine you were prescribed. This may include:   A rash.   Nausea.   Vomiting.   Diarrhea.   Your symptoms do not improve after a few days of taking antibiotics.   Your symptoms get worse.   You develop increased pain, such as in the testicles (for males) or in the abdomen (for females).  You have a fever. MAKE SURE YOU:   Understand these instructions.  Will watch your condition.  Will get help right away if you are not doing well or get worse. Document Released: 04/06/2000 Document Revised: 08/24/2013 Document Reviewed: 10/15/2012 [employer] Patient Information 2015 [employer]. This information is not intended to replace advice given to you by your health care   sure you discuss any questions you have with your health care provider.

## 2014-05-17 LAB — GC/CHLAMYDIA PROBE AMP (~~LOC~~) NOT AT ARMC
Chlamydia: NEGATIVE
NEISSERIA GONORRHEA: NEGATIVE

## 2014-09-18 ENCOUNTER — Emergency Department (HOSPITAL_COMMUNITY)
Admission: EM | Admit: 2014-09-18 | Discharge: 2014-09-18 | Disposition: A | Discharge status: Home / Self Care | Payer: Medicaid Other | Attending: Emergency Medicine | Admitting: Emergency Medicine

## 2014-09-18 ENCOUNTER — Encounter (HOSPITAL_COMMUNITY): Payer: Self-pay | Admitting: Emergency Medicine

## 2014-09-18 ENCOUNTER — Emergency Department (HOSPITAL_COMMUNITY): Payer: Medicaid Other

## 2014-09-18 DIAGNOSIS — Z8679 Personal history of other diseases of the circulatory system: Secondary | ICD-10-CM | POA: Diagnosis not present

## 2014-09-18 DIAGNOSIS — S4991XA Unspecified injury of right shoulder and upper arm, initial encounter: Secondary | ICD-10-CM | POA: Insufficient documentation

## 2014-09-18 DIAGNOSIS — S299XXA Unspecified injury of thorax, initial encounter: Secondary | ICD-10-CM | POA: Diagnosis not present

## 2014-09-18 DIAGNOSIS — Z79899 Other long term (current) drug therapy: Secondary | ICD-10-CM | POA: Diagnosis not present

## 2014-09-18 DIAGNOSIS — Y9389 Activity, other specified: Secondary | ICD-10-CM | POA: Diagnosis not present

## 2014-09-18 DIAGNOSIS — Y999 Unspecified external cause status: Secondary | ICD-10-CM | POA: Insufficient documentation

## 2014-09-18 DIAGNOSIS — R0789 Other chest pain: Secondary | ICD-10-CM

## 2014-09-18 DIAGNOSIS — M79601 Pain in right arm: Secondary | ICD-10-CM

## 2014-09-18 DIAGNOSIS — Y92410 Unspecified street and highway as the place of occurrence of the external cause: Secondary | ICD-10-CM | POA: Diagnosis not present

## 2014-09-18 MED ORDER — IBUPROFEN 400 MG PO TABS
600.0000 mg | ORAL_TABLET | Freq: Once | ORAL | Status: AC
  Administered 2014-09-18: 600 mg via ORAL
  Filled 2014-09-18 (×2): qty 1
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
## 2014-09-18 NOTE — Discharge Instructions (Signed)
X-ray was normal. You will be sore for several days. Tylenol or ibuprofen for pain. Return here if arm pain worsens.

## 2014-09-19 ENCOUNTER — Encounter (HOSPITAL_COMMUNITY): Payer: Self-pay | Admitting: Emergency Medicine

## 2014-09-19 ENCOUNTER — Emergency Department (HOSPITAL_COMMUNITY)
Admission: EM | Admit: 2014-09-19 | Discharge: 2014-09-20 | Disposition: A | Discharge status: Home / Self Care | Payer: Medicaid Other | Attending: Emergency Medicine | Admitting: Emergency Medicine

## 2014-09-19 DIAGNOSIS — S20219D Contusion of unspecified front wall of thorax, subsequent encounter: Secondary | ICD-10-CM

## 2014-09-19 DIAGNOSIS — N939 Abnormal uterine and vaginal bleeding, unspecified: Secondary | ICD-10-CM | POA: Insufficient documentation

## 2014-09-19 DIAGNOSIS — R1084 Generalized abdominal pain: Secondary | ICD-10-CM | POA: Diagnosis not present

## 2014-09-19 DIAGNOSIS — Z8679 Personal history of other diseases of the circulatory system: Secondary | ICD-10-CM | POA: Diagnosis not present

## 2014-09-19 DIAGNOSIS — R079 Chest pain, unspecified: Secondary | ICD-10-CM | POA: Diagnosis present

## 2014-09-19 DIAGNOSIS — Z3202 Encounter for pregnancy test, result negative: Secondary | ICD-10-CM | POA: Insufficient documentation

## 2014-09-19 LAB — CBC WITH DIFFERENTIAL/PLATELET
Basophils Absolute: 0 10*3/uL (ref 0.0–0.1)
Basophils Relative: 0 % (ref 0–1)
Eosinophils Absolute: 0.4 10*3/uL (ref 0.0–0.7)
Eosinophils Relative: 5 % (ref 0–5)
HCT: 40.9 % (ref 36.0–46.0)
HEMOGLOBIN: 13.7 g/dL (ref 12.0–15.0)
LYMPHS ABS: 3.2 10*3/uL (ref 0.7–4.0)
Lymphocytes Relative: 37 % (ref 12–46)
MCH: 29.3 pg (ref 26.0–34.0)
MCHC: 33.5 g/dL (ref 30.0–36.0)
MCV: 87.4 fL (ref 78.0–100.0)
Monocytes Absolute: 0.5 10*3/uL (ref 0.1–1.0)
Monocytes Relative: 6 % (ref 3–12)
Neutro Abs: 4.6 10*3/uL (ref 1.7–7.7)
Neutrophils Relative %: 52 % (ref 43–77)
PLATELETS: 331 10*3/uL (ref 150–400)
RBC: 4.68 MIL/uL (ref 3.87–5.11)
RDW: 13.6 % (ref 11.5–15.5)
WBC: 8.7 10*3/uL (ref 4.0–10.5)

## 2014-09-19 LAB — WET PREP, GENITAL
Clue Cells Wet Prep HPF POC: NONE SEEN
Trich, Wet Prep: NONE SEEN
YEAST WET PREP: NONE SEEN

## 2014-09-19 LAB — POC URINE PREG, ED: Preg Test, Ur: NEGATIVE

## 2014-09-19 MED ORDER — MORPHINE SULFATE 4 MG/ML IJ SOLN
4.0000 mg | Freq: Once | INTRAMUSCULAR | Status: AC
  Administered 2014-09-19: 4 mg via INTRAVENOUS
  Filled 2014-09-19: qty 1

## 2014-09-19 MED ORDER — ONDANSETRON HCL 4 MG/2ML IJ SOLN
4.0000 mg | Freq: Once | INTRAMUSCULAR | Status: AC
  Administered 2014-09-19: 4 mg via INTRAVENOUS
  Filled 2014-09-19: qty 2

## 2014-09-19 MED ORDER — SODIUM CHLORIDE 0.9 % IV BOLUS (SEPSIS)
1000.0000 mL | Freq: Once | INTRAVENOUS | Status: AC
  Administered 2014-09-19: 1000 mL via INTRAVENOUS
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
## 2014-09-20 ENCOUNTER — Emergency Department (HOSPITAL_COMMUNITY): Payer: Medicaid Other

## 2014-09-20 ENCOUNTER — Encounter (HOSPITAL_COMMUNITY): Payer: Self-pay | Admitting: Radiology

## 2014-09-20 LAB — BASIC METABOLIC PANEL
Anion gap: 8 (ref 5–15)
BUN: 11 mg/dL (ref 6–20)
CALCIUM: 9.1 mg/dL (ref 8.9–10.3)
CO2: 24 mmol/L (ref 22–32)
CREATININE: 0.78 mg/dL (ref 0.44–1.00)
Chloride: 105 mmol/L (ref 101–111)
Glucose, Bld: 109 mg/dL — ABNORMAL HIGH (ref 65–99)
Potassium: 3.8 mmol/L (ref 3.5–5.1)
Sodium: 137 mmol/L (ref 135–145)

## 2014-09-20 LAB — HIV ANTIBODY (ROUTINE TESTING W REFLEX): HIV Screen 4th Generation wRfx: NONREACTIVE

## 2014-09-20 LAB — RPR: RPR: NONREACTIVE

## 2014-09-20 MED ORDER — IBUPROFEN 800 MG PO TABS: 800.0000 mg | ORAL_TABLET | Freq: Three times a day (TID) | ORAL | Status: DC

## 2014-09-20 MED ORDER — IOHEXOL 300 MG/ML  SOLN
100.0000 mL | Freq: Once | INTRAMUSCULAR | Status: AC | PRN
  Administered 2014-09-20: 100 mL via INTRAVENOUS

## 2014-09-20 MED ORDER — KETOROLAC TROMETHAMINE 30 MG/ML IJ SOLN
30.0000 mg | Freq: Once | INTRAMUSCULAR | Status: AC
  Administered 2014-09-20: 30 mg via INTRAVENOUS
  Filled 2014-09-20: qty 1

## 2014-09-20 MED ORDER — TRAMADOL HCL 50 MG PO TABS: 50.0000 mg | ORAL_TABLET | Freq: Four times a day (QID) | ORAL | Status: DC | PRN

## 2014-09-20 NOTE — Discharge Instructions (Signed)
Abnormal Uterine Bleeding Abnormal uterine bleeding can affect women at various stages in life, including teenagers, women in their reproductive years, pregnant women, and women who have reached menopause. Several kinds of uterine bleeding are considered abnormal, including:  Bleeding or spotting between periods.   Bleeding after sexual intercourse.   Bleeding that is heavier or more than normal.   Periods that last longer than usual.  Bleeding after menopause.  Many cases of abnormal uterine bleeding are minor and simple to treat, while others are more serious. Any type of abnormal bleeding should be evaluated by your health care provider. Treatment will depend on the cause of the bleeding. HOME CARE INSTRUCTIONS Monitor your condition for any changes. The following actions may help to alleviate any discomfort you are experiencing:  Avoid the use of tampons and douches as directed by your health care provider.  Change your pads frequently. You should get regular pelvic exams and Pap tests. Keep all follow-up appointments for diagnostic tests as directed by your health care provider.  SEEK MEDICAL CARE IF:   Your bleeding lasts more than 1 week.   You feel dizzy at times.  SEEK IMMEDIATE MEDICAL CARE IF:   You pass out.   You are changing pads every 15 to 30 minutes.   You have abdominal pain.  You have a fever.   You become sweaty or weak.   You are passing large blood clots from the vagina.   You start to feel nauseous and vomit. MAKE SURE YOU:   Understand these instructions.  Will watch your condition.  Will get help right away if you are not doing well or get worse. Document Released: 04/09/2005 Document Revised: 04/14/2013 Document Reviewed: 11/06/2012 [facility] Patient Information 2015 [place], [place]. This information is not intended to replace advice given to you by your health care provider. Make sure you discuss any questions you have with your  health care provider. Chest Contusion A chest contusion is a deep bruise on your chest area. Contusions are the result of an injury that caused bleeding under the skin. A chest contusion may involve bruising of the skin, muscles, or ribs. The contusion may turn blue, purple, or yellow. Minor injuries will give you a painless contusion, but more severe contusions may stay painful and swollen for a few weeks. CAUSES  A contusion is usually caused by a blow, trauma, or direct force to an area of the body. SYMPTOMS   Swelling and redness of the injured area.  Discoloration of the injured area.  Tenderness and soreness of the injured area.  Pain. DIAGNOSIS  The diagnosis can be made by taking a history and performing a physical exam. An X-ray, CT scan, or MRI may be needed to determine if there were any associated injuries, such as broken bones (fractures) or internal injuries. TREATMENT  Often, the best treatment for a chest contusion is resting, icing, and applying cold compresses to the injured area. Deep breathing exercises may be recommended to reduce the risk of pneumonia. Over-the-counter medicines may also be recommended for pain control. HOME CARE INSTRUCTIONS   Put ice on the injured area.  Put ice in a plastic bag.  Place a towel between your skin and the bag.  Leave the ice on for 15-20 minutes, 03-04 times a day.  Only take over-the-counter or prescription medicines as directed by your caregiver. Your caregiver may recommend avoiding anti-inflammatory medicines (aspirin, ibuprofen, and naproxen) for 48 hours because these medicines may increase bruising.  Rest  the injured area.  Perform deep-breathing exercises as directed by your caregiver.  Stop smoking if you smoke.  Do not lift objects over 5 pounds (2.3 kg) for 3 days or longer if recommended by your caregiver. SEEK IMMEDIATE MEDICAL CARE IF:   You have increased bruising or swelling.  You have pain that is  getting worse.  You have difficulty breathing.  You have dizziness, weakness, or fainting.  You have blood in your urine or stool.  You cough up or vomit blood.  Your swelling or pain is not relieved with medicines. MAKE SURE YOU:   Understand these instructions.  Will watch your condition.  Will get help right away if you are not doing well or get worse. Document Released: 01/02/2001 Document Revised: 01/02/2012 Document Reviewed: 10/01/2011 [facility] Patient Information 2015 [place], [place]. This information is not intended to replace advice given to you by your health care provider. Make sure you discuss any questions you have with your health care provider.

## 2014-09-21 ENCOUNTER — Inpatient Hospital Stay (HOSPITAL_COMMUNITY): Payer: Medicaid Other

## 2014-09-21 ENCOUNTER — Encounter (HOSPITAL_COMMUNITY): Payer: Self-pay | Admitting: *Deleted

## 2014-09-21 ENCOUNTER — Inpatient Hospital Stay (HOSPITAL_COMMUNITY)
Admission: AD | Admit: 2014-09-21 | Discharge: 2014-09-21 | Disposition: A | Discharge status: Home / Self Care | Payer: Medicaid Other | Source: Ambulatory Visit | Attending: Family Medicine | Admitting: Family Medicine

## 2014-09-21 DIAGNOSIS — R1031 Right lower quadrant pain: Secondary | ICD-10-CM

## 2014-09-21 DIAGNOSIS — R1030 Lower abdominal pain, unspecified: Secondary | ICD-10-CM | POA: Diagnosis not present

## 2014-09-21 DIAGNOSIS — G8929 Other chronic pain: Secondary | ICD-10-CM

## 2014-09-21 DIAGNOSIS — S3991XD Unspecified injury of abdomen, subsequent encounter: Secondary | ICD-10-CM | POA: Diagnosis not present

## 2014-09-21 DIAGNOSIS — N939 Abnormal uterine and vaginal bleeding, unspecified: Secondary | ICD-10-CM

## 2014-09-21 DIAGNOSIS — R1032 Left lower quadrant pain: Secondary | ICD-10-CM

## 2014-09-21 LAB — WET PREP, GENITAL
CLUE CELLS WET PREP: NONE SEEN
TRICH WET PREP: NONE SEEN
YEAST WET PREP: NONE SEEN

## 2014-09-21 LAB — CBC
HCT: 38 % (ref 36.0–46.0)
Hemoglobin: 13.5 g/dL (ref 12.0–15.0)
MCH: 30.4 pg (ref 26.0–34.0)
MCHC: 35.5 g/dL (ref 30.0–36.0)
MCV: 85.6 fL (ref 78.0–100.0)
Platelets: 308 10*3/uL (ref 150–400)
RBC: 4.44 MIL/uL (ref 3.87–5.11)
RDW: 13.3 % (ref 11.5–15.5)
WBC: 8.4 10*3/uL (ref 4.0–10.5)

## 2014-09-21 LAB — GC/CHLAMYDIA PROBE AMP (~~LOC~~) NOT AT ARMC
Chlamydia: NEGATIVE
NEISSERIA GONORRHEA: NEGATIVE

## 2014-09-21 MED ORDER — IBUPROFEN 800 MG PO TABS: 800.0000 mg | ORAL_TABLET | Freq: Three times a day (TID) | ORAL | Status: DC

## 2014-09-21 MED ORDER — CYCLOBENZAPRINE HCL 10 MG PO TABS
10.0000 mg | ORAL_TABLET | Freq: Once | ORAL | Status: AC
  Administered 2014-09-21: 10 mg via ORAL
  Filled 2014-09-21: qty 1

## 2014-09-21 MED ORDER — PROMETHAZINE HCL 25 MG PO TABS: 25.0000 mg | ORAL_TABLET | Freq: Four times a day (QID) | ORAL | Status: DC | PRN

## 2014-09-21 MED ORDER — KETOROLAC TROMETHAMINE 60 MG/2ML IM SOLN
60.0000 mg | Freq: Once | INTRAMUSCULAR | Status: AC
  Administered 2014-09-21: 60 mg via INTRAMUSCULAR
  Filled 2014-09-21: qty 2

## 2014-09-21 MED ORDER — CYCLOBENZAPRINE HCL 10 MG PO TABS: 10.0000 mg | ORAL_TABLET | Freq: Two times a day (BID) | ORAL | Status: DC | PRN
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
## 2014-09-22 LAB — GC/CHLAMYDIA PROBE AMP (~~LOC~~) NOT AT ARMC
Chlamydia: NEGATIVE
Neisseria Gonorrhea: NEGATIVE

## 2014-10-06 ENCOUNTER — Encounter: Payer: Medicaid Other | Admitting: Obstetrics & Gynecology

## 2015-01-12 ENCOUNTER — Emergency Department (HOSPITAL_COMMUNITY)
Admission: EM | Admit: 2015-01-12 | Discharge: 2015-01-12 | Disposition: A | Discharge status: Home / Self Care | Payer: Medicaid Other | Attending: Emergency Medicine | Admitting: Emergency Medicine

## 2015-01-12 ENCOUNTER — Emergency Department (HOSPITAL_COMMUNITY): Payer: Medicaid Other

## 2015-01-12 ENCOUNTER — Encounter (HOSPITAL_COMMUNITY): Payer: Self-pay | Admitting: Emergency Medicine

## 2015-01-12 DIAGNOSIS — Z8679 Personal history of other diseases of the circulatory system: Secondary | ICD-10-CM | POA: Insufficient documentation

## 2015-01-12 DIAGNOSIS — Y939 Activity, unspecified: Secondary | ICD-10-CM | POA: Diagnosis not present

## 2015-01-12 DIAGNOSIS — Y929 Unspecified place or not applicable: Secondary | ICD-10-CM | POA: Insufficient documentation

## 2015-01-12 DIAGNOSIS — W2209XA Striking against other stationary object, initial encounter: Secondary | ICD-10-CM | POA: Insufficient documentation

## 2015-01-12 DIAGNOSIS — Z791 Long term (current) use of non-steroidal anti-inflammatories (NSAID): Secondary | ICD-10-CM | POA: Diagnosis not present

## 2015-01-12 DIAGNOSIS — S6991XA Unspecified injury of right wrist, hand and finger(s), initial encounter: Secondary | ICD-10-CM | POA: Diagnosis not present

## 2015-01-12 DIAGNOSIS — Y999 Unspecified external cause status: Secondary | ICD-10-CM | POA: Diagnosis not present

## 2015-01-12 DIAGNOSIS — M25531 Pain in right wrist: Secondary | ICD-10-CM

## 2015-01-12 MED ORDER — IBUPROFEN 800 MG PO TABS: 800.0000 mg | ORAL_TABLET | Freq: Three times a day (TID) | ORAL | Status: DC | PRN
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
## 2015-01-12 NOTE — ED Notes (Signed)
Pt. accidentally hit her right wrist against the door at home last Monday , presents with right wrist pain and mild swelling .

## 2015-03-19 ENCOUNTER — Emergency Department (HOSPITAL_COMMUNITY)
Admission: EM | Admit: 2015-03-19 | Discharge: 2015-03-19 | Disposition: A | Discharge status: Home / Self Care | Payer: Medicaid Other | Attending: Emergency Medicine | Admitting: Emergency Medicine

## 2015-03-19 ENCOUNTER — Encounter (HOSPITAL_COMMUNITY): Payer: Self-pay | Admitting: Nurse Practitioner

## 2015-03-19 DIAGNOSIS — R1030 Lower abdominal pain, unspecified: Secondary | ICD-10-CM | POA: Diagnosis not present

## 2015-03-19 DIAGNOSIS — Z3202 Encounter for pregnancy test, result negative: Secondary | ICD-10-CM | POA: Insufficient documentation

## 2015-03-19 DIAGNOSIS — G43909 Migraine, unspecified, not intractable, without status migrainosus: Secondary | ICD-10-CM | POA: Diagnosis not present

## 2015-03-19 DIAGNOSIS — Z9889 Other specified postprocedural states: Secondary | ICD-10-CM | POA: Diagnosis not present

## 2015-03-19 LAB — COMPREHENSIVE METABOLIC PANEL
ALT: 18 U/L (ref 14–54)
AST: 19 U/L (ref 15–41)
Albumin: 4 g/dL (ref 3.5–5.0)
Alkaline Phosphatase: 56 U/L (ref 38–126)
Anion gap: 8 (ref 5–15)
BUN: 7 mg/dL (ref 6–20)
CALCIUM: 8.8 mg/dL — AB (ref 8.9–10.3)
CO2: 21 mmol/L — ABNORMAL LOW (ref 22–32)
CREATININE: 0.59 mg/dL (ref 0.44–1.00)
Chloride: 109 mmol/L (ref 101–111)
GFR calc Af Amer: >60 mL/min (ref >60)
GLUCOSE: 85 mg/dL (ref 65–99)
Potassium: 3.9 mmol/L (ref 3.5–5.1)
Sodium: 138 mmol/L (ref 135–145)
Total Bilirubin: 0.2 mg/dL — ABNORMAL LOW (ref 0.3–1.2)
Total Protein: 7.4 g/dL (ref 6.5–8.1)

## 2015-03-19 LAB — CBC
HCT: 43.3 % (ref 36.0–46.0)
Hemoglobin: 14.5 g/dL (ref 12.0–15.0)
MCH: 29.4 pg (ref 26.0–34.0)
MCHC: 33.5 g/dL (ref 30.0–36.0)
MCV: 87.8 fL (ref 78.0–100.0)
PLATELETS: 300 10*3/uL (ref 150–400)
RBC: 4.93 MIL/uL (ref 3.87–5.11)
RDW: 13.2 % (ref 11.5–15.5)
WBC: 8.5 10*3/uL (ref 4.0–10.5)

## 2015-03-19 LAB — URINALYSIS, ROUTINE W REFLEX MICROSCOPIC
BILIRUBIN URINE: NEGATIVE
Glucose, UA: NEGATIVE mg/dL
Hgb urine dipstick: NEGATIVE
KETONES UR: NEGATIVE mg/dL
Leukocytes, UA: NEGATIVE
Nitrite: NEGATIVE
PH: 7.5 (ref 5.0–8.0)
Protein, ur: NEGATIVE mg/dL
Specific Gravity, Urine: 1.023 (ref 1.005–1.030)

## 2015-03-19 LAB — I-STAT BETA HCG BLOOD, ED (MC, WL, AP ONLY)

## 2015-03-19 LAB — LIPASE, BLOOD: Lipase: 27 U/L (ref 11–51)

## 2015-03-19 MED ORDER — IBUPROFEN 400 MG PO TABS
600.0000 mg | ORAL_TABLET | Freq: Once | ORAL | Status: AC
  Administered 2015-03-19: 600 mg via ORAL
  Filled 2015-03-19: qty 1

## 2015-03-19 NOTE — ED Notes (Addendum)
She c/o 2 day history of lower abd pain, worsening since onset, feels like the pain is where her c-section incision is. She denies fevers, n/v, bowel/bladder changes. Pain incrased with movement and palpation. She is A&Ox4, resp e/u

## 2015-03-19 NOTE — Discharge Instructions (Signed)

## 2015-03-19 NOTE — ED Provider Notes (Signed)
CSN: [id number]     Arrival date & time 03/19/15  1428 History   First MD Initiated Contact with Patient 03/19/15 1548     Chief Complaint  Patient presents with  . Abdominal Pain   HPI   24 year old female presents today with abdominal pain. She reports symptoms started 2 days ago as a "sharp" pain in the central lower abdomen just superior to her C-section incision site. She reports the pain is worse with palpation and sitting up. She reports similar symptoms approximately 4 months ago that resolved on their own without intervention. Patient denies any swelling, redness, warmth to touch around the site of discomfort, she denies any vaginal bleeding, discharge, nausea, vomiting, upper abdominal pain, fever, chills, or any other concerning signs or symptoms. She denies any acute cause for the pain. Pain is not made worse by eating and drinking, or urination or defecation. She reports normal bowel movements most recently today, and urinary color clarity characteristics have been normal. Patient reports that she was not able to go to work today and would like a work note.  Past Medical History  Diagnosis Date  . Migraine    Past Surgical History  Procedure Laterality Date  . Cesarean section N/A 10/17/2012    Procedure: CESAREAN SECTION;  Surgeon: Tereso Newcomer, MD;  Location: [facility];  Service: Obstetrics;  Laterality: N/A;   Family History  Problem Relation Age of Onset  . Other Neg Hx   . Alcohol abuse Neg Hx   . Arthritis Neg Hx   . Asthma Neg Hx   . Birth defects Neg Hx   . Cancer Neg Hx   . COPD Neg Hx   . Depression Neg Hx   . Diabetes Neg Hx   . Drug abuse Neg Hx   . Early death Neg Hx   . Hearing loss Neg Hx   . Heart disease Neg Hx   . Hyperlipidemia Neg Hx   . Hypertension Neg Hx   . Kidney disease Neg Hx   . Learning disabilities Neg Hx   . Mental illness Neg Hx   . Mental retardation Neg Hx   . Miscarriages / Stillbirths Neg Hx   . Stroke Neg Hx   . Vision loss  Neg Hx    Social History  Substance Use Topics  . Smoking status: Never Smoker   . Smokeless tobacco: Never Used  . Alcohol Use: No   OB History    Gravida Para Term Preterm AB TAB SAB Ectopic Multiple Living   Review of Systems  All other systems reviewed and are negative.   Allergies  Review of patient's allergies indicates no known allergies.  Home Medications   Prior to Admission medications   Medication Sig Start Date End Date Taking? Authorizing Provider  acetaminophen (TYLENOL) 500 MG tablet Take 1,000 mg by mouth every 4 (four) hours as needed for mild pain or headache.     Historical Provider, MD  cyclobenzaprine (FLEXERIL) 10 MG tablet Take 1 tablet (10 mg total) by mouth 2 (two) times daily as needed for muscle spasms. 09/21/14   Jean Rosenthal, NP  ibuprofen (ADVIL,MOTRIN) 800 MG tablet Take 1 tablet (800 mg total) by mouth every 8 (eight) hours as needed. 01/12/15   Heather Laisure, PA-C  promethazine (PHENERGAN) 25 MG tablet Take 1 tablet (25 mg total) by mouth every 6 (six) hours as needed for nausea  or vomiting. 09/21/14   Jean Rosenthal, NP   BP 116/67 mmHg  Pulse 80  Temp(Src) 98.1 F (36.7 C) (Oral)  Resp 18  Ht  (1.575 m)  Wt 69.854 kg  BMI 28.16 kg/m2  SpO2 97%  LMP 01/17/2015   Physical Exam  Constitutional: She is oriented to person, place, and time. She appears well-developed and well-nourished.  HENT:  Head: Normocephalic and atraumatic.  Eyes: Conjunctivae are normal. Pupils are equal, round, and reactive to light. Right eye exhibits no discharge. Left eye exhibits no discharge. No scleral icterus.  Neck: Normal range of motion. No JVD present. No tracheal deviation present.  Cardiovascular: Normal rate, regular rhythm, normal heart sounds and intact distal pulses.  Exam reveals no friction rub.   No murmur heard. Pulmonary/Chest: Effort normal and breath sounds normal. No stridor. No respiratory distress. She has  no wheezes. She has no rales. She exhibits no tenderness.  Abdominal: Soft. Bowel sounds are normal. She exhibits no distension and no mass. There is no tenderness. There is no rebound and no guarding.  Clean scar from C-section 3 years prior, very minimal tenderness to palpation superior to C-section over the bladder. Remainder of abdominal exam nontender, no rebound, guarding, distention, mass.  Musculoskeletal: Normal range of motion. She exhibits no edema or tenderness.  Neurological: She is alert and oriented to person, place, and time. Coordination normal.  Skin: Skin is warm and dry. No rash noted. No erythema. No pallor.  Psychiatric: She has a normal mood and affect. Her behavior is normal. Judgment and thought content normal.  Nursing note and vitals reviewed.   ED Course  Procedures (including critical care time) Labs Review Labs Reviewed  COMPREHENSIVE METABOLIC PANEL - Abnormal; Notable for the following:    CO2 21 (*)    Calcium 8.8 (*)    Total Bilirubin 0.2 (*)    All other components within normal limits  LIPASE, BLOOD  CBC  URINALYSIS, ROUTINE W REFLEX MICROSCOPIC (NOT AT [facility])  I-STAT BETA HCG BLOOD, ED ([facility], [facility], [facility] ONLY)    Imaging Review No results found. I have personally reviewed and evaluated these images and lab results as part of my medical decision-making.   EKG Interpretation None      MDM   Final diagnoses:  Lower abdominal pain    Labs: I-STAT beta-hCG, lipase, CMP, CBC, urinalysis- no significant findings  Imaging:  Consults:  Therapeutics: Ibuprofen  Discharge Meds:   Assessment/Plan: Patient presents with abdominal pain today, located over the bladder, very minimal tenderness nonsurgical abdomen. Patient appears to be in no acute distress, nontoxic. She is afebrile with normal vital signs, her laboratory evaluation shows no significant findings including urinary tract infection. Patient denies any changes in bowel or bladder habits.  Patient for small bowel obstruction. Patient has no signs of urinary tract infection. She denies any vaginal bleeding or discharge. Pelvic exam indicated for his lower abdominal pain to rule out any signs of INFLAMMATORY disease or infection, patient refused pelvic exam even after discussion of potential life-threatening debilitating consequences of not full evaluation. Patient has not taken anything for the pain at home, she was given ibuprofen here. Patient requesting work note, she will given a note reporting that she was seen here in the ED today. Patient is given strict return precautions, encouraged follow-up with her OB/GYN or primary care provider for reevaluation of symptoms persist, return if symptoms worsen. Patient verbalized understanding and agreement to today's plan and had no  further questions or concerns at time of discharge.         Eyvonne MechanicJeffrey Allisha Harter, PA-C 03/19/15 1612  Leta BaptistEmily Roe Nguyen, MD 03/20/15 1051

## 2015-03-19 NOTE — ED Notes (Signed)
Pt is in stable condition upon d/c and ambulates from ED. 

## 2015-08-24 ENCOUNTER — Encounter (HOSPITAL_COMMUNITY): Payer: Self-pay | Admitting: Family Medicine

## 2015-08-24 ENCOUNTER — Emergency Department (HOSPITAL_COMMUNITY)
Admission: EM | Admit: 2015-08-24 | Discharge: 2015-08-24 | Disposition: A | Discharge status: Home / Self Care | Payer: Medicaid Other | Attending: Emergency Medicine | Admitting: Emergency Medicine

## 2015-08-24 DIAGNOSIS — Z8679 Personal history of other diseases of the circulatory system: Secondary | ICD-10-CM | POA: Insufficient documentation

## 2015-08-24 DIAGNOSIS — Z3202 Encounter for pregnancy test, result negative: Secondary | ICD-10-CM | POA: Diagnosis not present

## 2015-08-24 DIAGNOSIS — R519 Headache, unspecified: Secondary | ICD-10-CM

## 2015-08-24 DIAGNOSIS — R51 Headache: Secondary | ICD-10-CM | POA: Insufficient documentation

## 2015-08-24 LAB — POC URINE PREG, ED: Preg Test, Ur: NEGATIVE

## 2015-08-24 MED ORDER — SODIUM CHLORIDE 0.9 % IV BOLUS (SEPSIS)
500.0000 mL | Freq: Once | INTRAVENOUS | Status: AC
  Administered 2015-08-24: 500 mL via INTRAVENOUS

## 2015-08-24 MED ORDER — DIPHENHYDRAMINE HCL 50 MG/ML IJ SOLN
25.0000 mg | Freq: Once | INTRAMUSCULAR | Status: DC
  Filled 2015-08-24: qty 1

## 2015-08-24 MED ORDER — PROCHLORPERAZINE MALEATE 5 MG PO TABS
5.0000 mg | ORAL_TABLET | Freq: Once | ORAL | Status: AC
  Administered 2015-08-24: 5 mg via ORAL
  Filled 2015-08-24: qty 1

## 2015-08-24 MED ORDER — KETOROLAC TROMETHAMINE 15 MG/ML IJ SOLN
15.0000 mg | Freq: Once | INTRAMUSCULAR | Status: AC
  Administered 2015-08-24: 15 mg via INTRAVENOUS
  Filled 2015-08-24: qty 1

## 2015-08-24 MED ORDER — IBUPROFEN 800 MG PO TABS: 800.0000 mg | ORAL_TABLET | Freq: Two times a day (BID) | ORAL | Status: DC | PRN

## 2015-08-24 NOTE — ED Provider Notes (Signed)
CSN: [id number]     Arrival date & time 08/24/15  7829 History   First MD Initiated Contact with Patient 08/24/15 [phone]     Chief Complaint  Patient presents with  . Migraine     (Consider location/radiation/quality/duration/timing/severity/associated sxs/prior Treatment) HPI  [name] is a 25 yo female with h/o migraine presenting with 3 days worsening right sided, stabbing HA radiating from the right eye, a/w feeling hot.  Tylenol has not relieved the pain.  She has not noticed an association with bending forward.  She endorses photophobia and phonophobia.  She denies blurry vision, nausea, or vomiting.  She denies recent trauma.  She denies preceding aura.  Over the last 2 months, she has been having increased frequency of this headache, occurring 1-2x/week.   She has a h/o migraine, which is usually bilateral and a/w preceding aura or neck pain (which she did not have this time).  She reports her first migraine was at 25 yo.  She reports having a CT scan 5 years ago due to her migraines, which was negative.  Her last typical migraine was 5 months ago.   Past Medical History  Diagnosis Date  . Migraine    Past Surgical History  Procedure Laterality Date  . Cesarean section N/A 10/17/2012    Procedure: CESAREAN SECTION;  Surgeon: Tereso Newcomer, MD;  Location: [facility];  Service: Obstetrics;  Laterality: N/A;   Family History  Problem Relation Age of Onset  . Other Neg Hx   . Alcohol abuse Neg Hx   . Arthritis Neg Hx   . Asthma Neg Hx   . Birth defects Neg Hx   . Cancer Neg Hx   . COPD Neg Hx   . Depression Neg Hx   . Diabetes Neg Hx   . Drug abuse Neg Hx   . Early death Neg Hx   . Hearing loss Neg Hx   . Heart disease Neg Hx   . Hyperlipidemia Neg Hx   . Hypertension Neg Hx   . Kidney disease Neg Hx   . Learning disabilities Neg Hx   . Mental illness Neg Hx   . Mental retardation Neg Hx   . Miscarriages / Stillbirths Neg Hx   . Stroke Neg Hx   . Vision loss  Neg Hx    Social History  Substance Use Topics  . Smoking status: Never Smoker   . Smokeless tobacco: Never Used  . Alcohol Use: No   OB History    Gravida Para Term Preterm AB TAB SAB Ectopic Multiple Living   Review of Systems    Allergies  Review of patient's allergies indicates no known allergies.  Home Medications   Prior to Admission medications   Medication Sig Start Date End Date Taking? Authorizing Provider  acetaminophen (TYLENOL) 500 MG tablet Take 1,000 mg by mouth every 4 (four) hours as needed for mild pain or headache.    Yes Historical Provider, MD  ibuprofen (ADVIL,MOTRIN) 800 MG tablet Take 1 tablet (800 mg total) by mouth 2 (two) times daily as needed for headache. 08/24/15   Sharon Half, MD   BP 97/56 mmHg  Pulse 68  Temp(Src) 98.1 F (36.7 C) (Oral)  Resp 18  Wt 71.215 kg  SpO2 100% Physical Exam  Constitutional: She is oriented to person, place, and time. She appears well-developed and well-nourished. No distress.  HENT:  Head:  Normocephalic and atraumatic.  Recreation of HA with palpation of posterior trapezius and posterior occiput.  Eyes: EOM are normal. Pupils are equal, round, and reactive to light. No scleral icterus.  Neck: No tracheal deviation present.  Cardiovascular: Normal rate, regular rhythm and normal heart sounds.   Pulmonary/Chest: Effort normal and breath sounds normal. No stridor. No respiratory distress. She has no wheezes.  Abdominal: Soft. She exhibits no distension. There is no tenderness. There is no rebound and no guarding.  Musculoskeletal: She exhibits no edema.  Neurological: She is alert and oriented to person, place, and time.  Strength 5/5 in bilateral UE and LE.  CN II-XII intact.  Skin: Skin is warm and dry. She is not diaphoretic.    ED Course  Procedures (including critical care time) Labs Review Labs Reviewed  POC URINE PREG, ED    Imaging Review No results found. I have  personally reviewed and evaluated these images and lab results as part of my medical decision-making.   EKG Interpretation None      MDM   Final diagnoses:  None   25 yo female with history of migraine presenting with recurrent HA, now lasting 3 days.  HA recreated with palpation of trapezius and occiput, with pain in the correct distribution.  This HA is different from previous migraines and she has no focal neurologic deficits.  HA improved with Compazine, Toradol, and IVF.  Patient will be discharged with Ibuprofen 800 mg BID and recommended that she establish care with a physician for possible referral to PT. - Ibuprofen 800 mg BID     Sharon HalfNicholas A Ravneet Spilker, MD 08/24/15 1253  Sharon OctaveStephen Rancour, MD 08/24/15 1556

## 2015-08-24 NOTE — Discharge Instructions (Signed)
1. Take Ibuprofen 800 mg (1 tab) twice daily as needed for headache. 2. Return to the emergency room if you start to experience worsening headache, nausea, or vomiting.

## 2015-08-24 NOTE — ED Notes (Signed)
Patient d/c'd from IV, continuous pulse oximetry and blood pressure cuff; patient getting dressed to be discharged home 

## 2015-08-24 NOTE — ED Notes (Signed)
Pt ambulated to rest room and drinking apple juice.

## 2015-08-24 NOTE — ED Notes (Signed)
Pt here for migraine x 3 days and taking OTC meds without relief.
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
## 2015-11-21 ENCOUNTER — Encounter: Payer: Self-pay | Admitting: General Practice

## 2015-11-22 ENCOUNTER — Encounter: Payer: Self-pay | Admitting: *Deleted

## 2015-11-23 ENCOUNTER — Ambulatory Visit (INDEPENDENT_AMBULATORY_CARE_PROVIDER_SITE_OTHER): Payer: Medicaid Other | Admitting: Obstetrics & Gynecology

## 2015-11-23 ENCOUNTER — Encounter: Payer: Self-pay | Admitting: Obstetrics & Gynecology

## 2015-11-23 ENCOUNTER — Other Ambulatory Visit (HOSPITAL_COMMUNITY)
Admission: RE | Admit: 2015-11-23 | Discharge: 2015-11-23 | Disposition: A | Discharge status: Home / Self Care | Payer: Medicaid Other | Source: Ambulatory Visit | Attending: Obstetrics & Gynecology | Admitting: Obstetrics & Gynecology

## 2015-11-23 VITALS — BP 130/59 | HR 76 | Ht 62.0 in | Wt 165.5 lb

## 2015-11-23 DIAGNOSIS — Z30433 Encounter for removal and reinsertion of intrauterine contraceptive device: Secondary | ICD-10-CM

## 2015-11-23 DIAGNOSIS — Z113 Encounter for screening for infections with a predominantly sexual mode of transmission: Secondary | ICD-10-CM | POA: Diagnosis not present

## 2015-11-23 DIAGNOSIS — Z30430 Encounter for insertion of intrauterine contraceptive device: Secondary | ICD-10-CM

## 2015-11-23 MED ORDER — LEVONORGESTREL 18.6 MCG/DAY IU IUD
INTRAUTERINE_SYSTEM | Freq: Once | INTRAUTERINE | Status: AC
  Administered 2015-11-23: 1 via INTRAUTERINE

## 2015-11-23 NOTE — Progress Notes (Signed)
History:  25 y.o. [id number] here today for c/o pain on left side and bleeding after intercourse ONLY.  All of these sx were noted since her IUD was placed 2 months prev. Intercourse is also painful.   Pt reports that the placement of her IUD was extremly painful.     The following portions of the patient's history were reviewed and updated as appropriate: allergies, current medications, past family history, past medical history, past social history, past surgical history and problem list.  Review of Systems:  Pertinent items are noted in HPI.  Objective:  Physical Exam Blood pressure (!) 130/59, pulse 76, height 5\' 2"  (1.575 m), weight 165 lb 8 oz (75.1 kg). Gen: NAD Abd: Soft, nontender and nondistended Pelvic: Normal appearing external genitalia; normal appearing vaginal mucosa and cervix.  Normal discharge.  Small uterus, no other palpable masses, no uterine or adnexal tenderness  Patient identified, informed consent performed.  Discussed risks of irregular bleeding, cramping, infection, malpositioning or misplacement of the IUD outside the uterus which may require further procedures. Time out was performed.  Patient was in the dorsal lithotomy position, normal external genitalia was noted.  A speculum was placed in the patient's vagina, normal discharge was noted, no lesions. The multiparous cervix was visualized, no lesions, no abnormal discharge;  and the cervix was swabbed with Betadine using scopettes. The strings of the IUD were grasped and pulled using ring forceps.  The IUD was successfully removed in its entirety.    Cervix cleaned with Betadine x 2.  Grasped anteriorly with a single tooth tenaculum.  Uterus sounded to 8 cm.  Liletta IUD placed per manufacturer's recommendations.  Strings trimmed to 3 cm. Tenaculum was removed, good hemostasis noted.  Patient tolerated procedure well.  Reports no pain during or after procedure.   Labs and Imaging No results found.  Assessment & Plan:   Pelvic pain, dyspareunia and bleeding after prev IUD.    IUD removed and replaced. Patient was given post-procedure instructions.  Patient was also asked to follow up in 4 weeks for IUD check.  Harlym Gehling L. Harraway-Smith, M.D., [facility] L. Harraway-Smith, M.D., [facility]

## 2015-11-23 NOTE — Patient Instructions (Signed)

## 2015-11-24 ENCOUNTER — Encounter: Payer: Self-pay | Admitting: Obstetrics & Gynecology

## 2015-11-24 LAB — GC/CHLAMYDIA PROBE AMP (~~LOC~~) NOT AT ARMC
Chlamydia: NEGATIVE
NEISSERIA GONORRHEA: NEGATIVE

## 2015-12-21 ENCOUNTER — Encounter: Payer: Self-pay | Admitting: Obstetrics & Gynecology

## 2015-12-21 ENCOUNTER — Ambulatory Visit (INDEPENDENT_AMBULATORY_CARE_PROVIDER_SITE_OTHER): Payer: Self-pay | Admitting: Obstetrics & Gynecology

## 2015-12-21 VITALS — BP 124/67 | HR 87 | Ht 62.0 in | Wt 163.0 lb

## 2015-12-21 DIAGNOSIS — Z30431 Encounter for routine checking of intrauterine contraceptive device: Secondary | ICD-10-CM

## 2015-12-21 LAB — POCT URINALYSIS DIP (DEVICE)
Bilirubin Urine: NEGATIVE
Bilirubin Urine: NEGATIVE
GLUCOSE, UA: NEGATIVE mg/dL
Glucose, UA: NEGATIVE mg/dL
HGB URINE DIPSTICK: NEGATIVE
KETONES UR: NEGATIVE mg/dL
Ketones, ur: NEGATIVE mg/dL
LEUKOCYTES UA: NEGATIVE
Leukocytes, UA: NEGATIVE
Nitrite: NEGATIVE
Nitrite: NEGATIVE
PH: 5.5 (ref 5.0–8.0)
PROTEIN: NEGATIVE mg/dL
Protein, ur: NEGATIVE mg/dL
SPECIFIC GRAVITY, URINE: 1.02 (ref 1.005–1.030)
Specific Gravity, Urine: 1.02 (ref 1.005–1.030)
UROBILINOGEN UA: 1 mg/dL (ref 0.0–1.0)
Urobilinogen, UA: 1 mg/dL (ref 0.0–1.0)
pH: 6.5 (ref 5.0–8.0)

## 2015-12-21 NOTE — Progress Notes (Signed)
History:  25 y.o. [id number] here today for today for IUD string check; Mirena IUD was placed  11/23/2015. No complaints about the Mirena, no concerning side effects. Pt reports heavy bleeding initially with some cramping. She reports only min cramping at present.  The following portions of the patient's history were reviewed and updated as appropriate: allergies, current medications, past family history, past medical history, past social history, past surgical history and problem list.   Review of Systems:  Pertinent items are noted in HPI.  Objective:  Physical Exam Blood pressure 124/67, pulse 87, height 5\' 2"  (1.575 m), weight 163 lb (73.9 kg). Gen: NAD Abd: Soft, nontender and nondistended Pelvic: Normal appearing external genitalia; normal appearing vaginal mucosa and cervix.  IUD strings NOT  Visualized.  Assessment & Plan:  IUD check. Given the heavy bleeding that pt had following hte procedure will bobtain a son ot oeval location of the IUD  Suzannah Bettes L. Harraway-Smith, M.D., [facility]

## 2015-12-21 NOTE — Patient Instructions (Signed)

## 2015-12-29 ENCOUNTER — Ambulatory Visit (HOSPITAL_COMMUNITY)
Admission: RE | Admit: 2015-12-29 | Discharge: 2015-12-29 | Disposition: A | Discharge status: Home / Self Care | Payer: Self-pay | Source: Ambulatory Visit | Attending: Obstetrics & Gynecology | Admitting: Obstetrics & Gynecology

## 2015-12-29 DIAGNOSIS — Z30431 Encounter for routine checking of intrauterine contraceptive device: Secondary | ICD-10-CM | POA: Insufficient documentation

## 2016-06-05 ENCOUNTER — Encounter (HOSPITAL_COMMUNITY): Payer: Self-pay | Admitting: Emergency Medicine

## 2016-06-05 ENCOUNTER — Emergency Department (HOSPITAL_COMMUNITY)
Admission: EM | Admit: 2016-06-05 | Discharge: 2016-06-05 | Disposition: A | Discharge status: Home / Self Care | Payer: Medicaid Other | Attending: Emergency Medicine | Admitting: Emergency Medicine

## 2016-06-05 DIAGNOSIS — Y939 Activity, unspecified: Secondary | ICD-10-CM | POA: Insufficient documentation

## 2016-06-05 DIAGNOSIS — M542 Cervicalgia: Secondary | ICD-10-CM | POA: Insufficient documentation

## 2016-06-05 DIAGNOSIS — Y92410 Unspecified street and highway as the place of occurrence of the external cause: Secondary | ICD-10-CM | POA: Insufficient documentation

## 2016-06-05 DIAGNOSIS — Y999 Unspecified external cause status: Secondary | ICD-10-CM | POA: Insufficient documentation

## 2016-06-05 MED ORDER — LIDOCAINE 5 % EX PTCH: 1.0000 | MEDICATED_PATCH | CUTANEOUS | 0 refills | Status: DC

## 2016-06-05 MED ORDER — METHOCARBAMOL 500 MG PO TABS: 500.0000 mg | ORAL_TABLET | Freq: Two times a day (BID) | ORAL | 0 refills | Status: DC

## 2016-06-05 MED ORDER — IBUPROFEN 800 MG PO TABS: 800.0000 mg | ORAL_TABLET | Freq: Three times a day (TID) | ORAL | 0 refills | Status: DC
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
## 2016-06-05 NOTE — Discharge Instructions (Signed)
Expect your soreness to increase over the next 2-3 days. Take it easy, but do not lay around too much as this may make any stiffness worse.  °Antiinflammatory medications: Take 500 mg of naproxen every 12 hours or 800 mg of ibuprofen every 8 hours for the next 3 days. Take these medications with food to avoid upset stomach. Choose only one of these medications, do not take them together. ° °Muscle relaxer: Robaxin is a muscle relaxer and may help loosen stiff muscles. Do not take the Robaxin while driving or performing other dangerous activities.  ° °Lidocaine patches: These are available via either prescription or over-the-counter. The over-the-counter option may be more economical one and are likely just as effective. There are multiple over-the-counter brands, such as Salonpas. ° °Exercises: Be sure to perform the attached exercises starting with three times a week and working up to performing them daily. This is an essential part of preventing long term problems.  ° °Follow up with a primary care provider for any future management of these complaints. °

## 2016-06-05 NOTE — ED Notes (Signed)
Pt ambulatory and independent at discharge.  Verbalized understanding of discharge instructions 
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
## 2016-06-13 ENCOUNTER — Encounter (HOSPITAL_COMMUNITY): Payer: Self-pay

## 2016-06-13 ENCOUNTER — Emergency Department (HOSPITAL_COMMUNITY)
Admission: EM | Admit: 2016-06-13 | Discharge: 2016-06-13 | Disposition: A | Discharge status: Home / Self Care | Payer: Medicaid Other | Attending: Emergency Medicine | Admitting: Emergency Medicine

## 2016-06-13 DIAGNOSIS — R51 Headache: Secondary | ICD-10-CM | POA: Insufficient documentation

## 2016-06-13 DIAGNOSIS — Z79899 Other long term (current) drug therapy: Secondary | ICD-10-CM | POA: Insufficient documentation

## 2016-06-13 DIAGNOSIS — H6993 Unspecified Eustachian tube disorder, bilateral: Secondary | ICD-10-CM | POA: Insufficient documentation

## 2016-06-13 DIAGNOSIS — H6983 Other specified disorders of Eustachian tube, bilateral: Secondary | ICD-10-CM

## 2016-06-13 DIAGNOSIS — R519 Headache, unspecified: Secondary | ICD-10-CM

## 2016-06-13 MED ORDER — ACETAMINOPHEN 325 MG PO TABS
650.0000 mg | ORAL_TABLET | Freq: Once | ORAL | Status: AC
  Administered 2016-06-13: 650 mg via ORAL
  Filled 2016-06-13: qty 2

## 2016-06-13 MED ORDER — FLUTICASONE PROPIONATE 50 MCG/ACT NA SUSP: 2.0000 | Freq: Every day | NASAL | 1 refills | Status: DC

## 2016-06-13 MED ORDER — METOCLOPRAMIDE HCL 5 MG/ML IJ SOLN
10.0000 mg | Freq: Once | INTRAMUSCULAR | Status: AC
  Administered 2016-06-13: 10 mg via INTRAVENOUS
  Filled 2016-06-13: qty 2

## 2016-06-13 MED ORDER — SODIUM CHLORIDE 0.9 % IV BOLUS (SEPSIS)
1000.0000 mL | Freq: Once | INTRAVENOUS | Status: AC
  Administered 2016-06-13: 1000 mL via INTRAVENOUS

## 2016-06-13 MED ORDER — DIPHENHYDRAMINE HCL 50 MG/ML IJ SOLN
25.0000 mg | Freq: Once | INTRAMUSCULAR | Status: AC
  Administered 2016-06-13: 25 mg via INTRAVENOUS
  Filled 2016-06-13: qty 1

## 2016-06-13 MED ORDER — DEXAMETHASONE SODIUM PHOSPHATE 10 MG/ML IJ SOLN
10.0000 mg | Freq: Once | INTRAMUSCULAR | Status: AC
  Administered 2016-06-13: 10 mg via INTRAVENOUS
  Filled 2016-06-13: qty 1
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
## 2016-06-13 NOTE — ED Notes (Signed)
I just met her in the hall, having ambulated to b.r. And back; and she smiles and tells me she feels "much better".

## 2016-12-23 IMAGING — US US TRANSVAGINAL NON-OB
1 series · 15 of 25 positions shown · non-contrast
Comparison: None.

CLINICAL DATA: IUD evaluation.

EXAM:
ULTRASOUND PELVIS TRANSVAGINAL
TECHNIQUE: Transvaginal ultrasound examination of the pelvis was performed
including evaluation of the uterus, ovaries, adnexal regions, and
pelvic cul-de-sac.

[Series 1: us transvaginal non-ob · 15 of 46 slices shown]
[im 1/46]
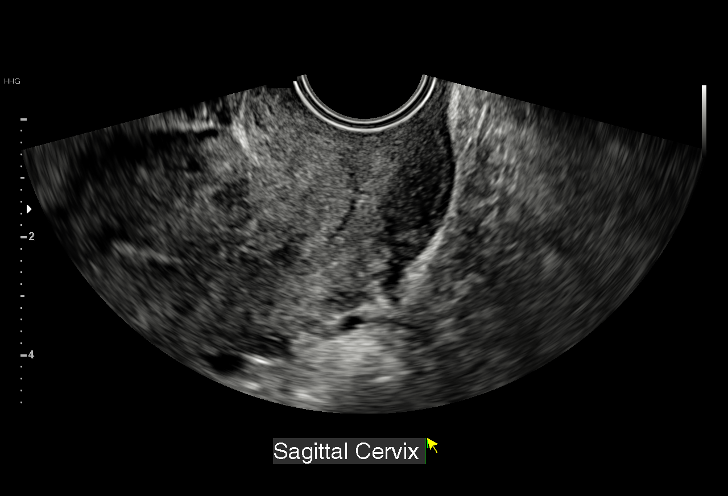
[im 4/46]
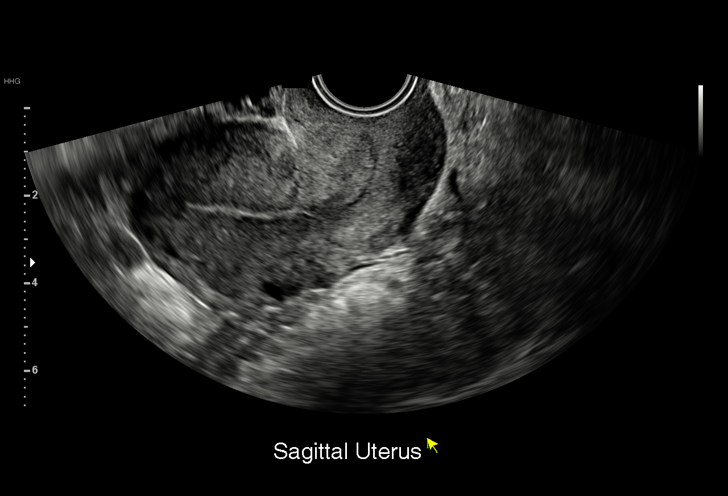
[im 8/46]
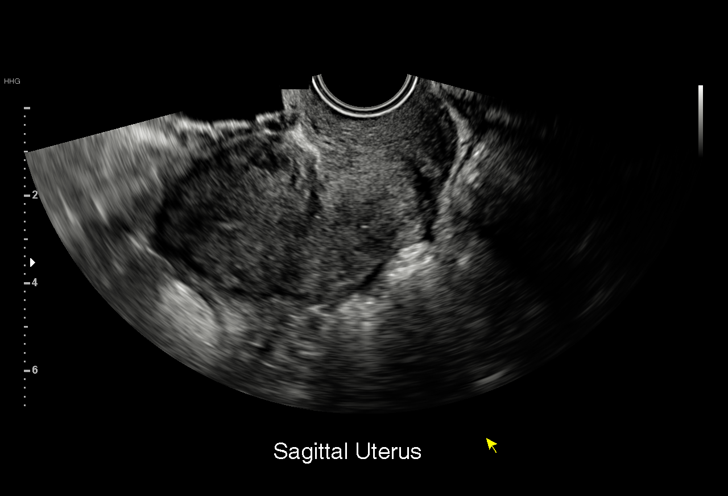
[im 10/46]
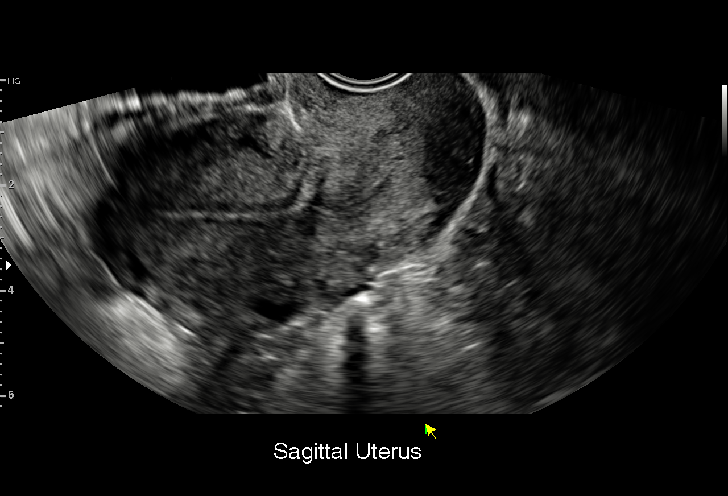
[im 14/46]
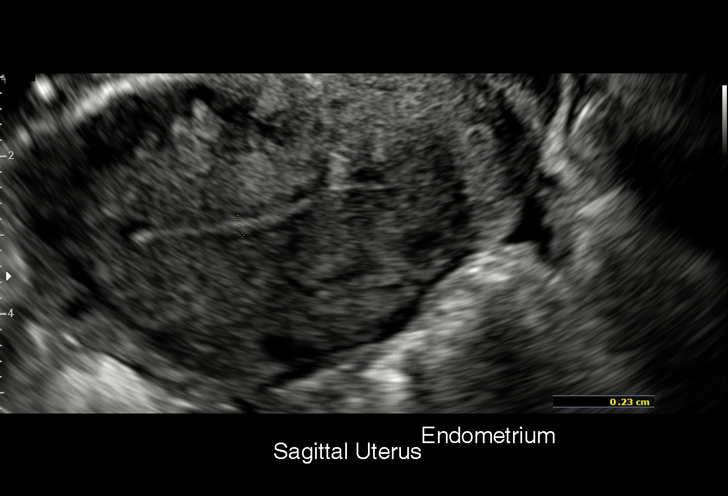
[im 17/46]
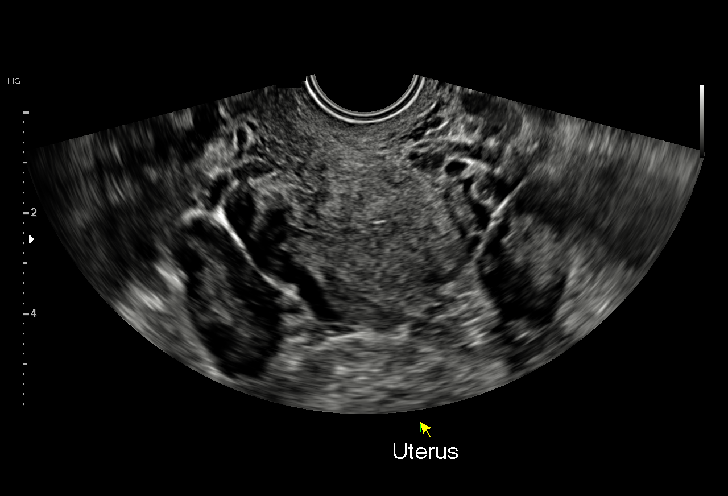
[im 19/46]
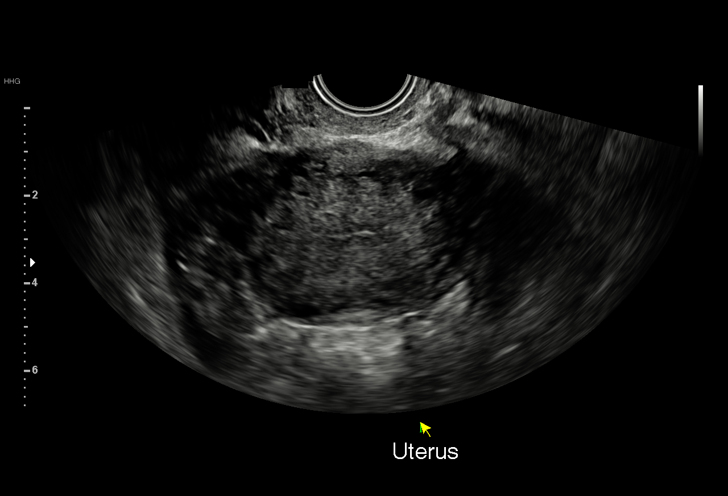
[im 23/46]
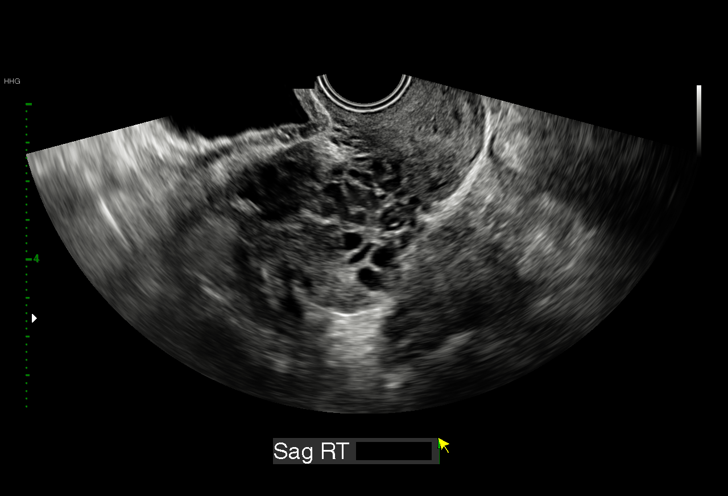
[im 27/46]
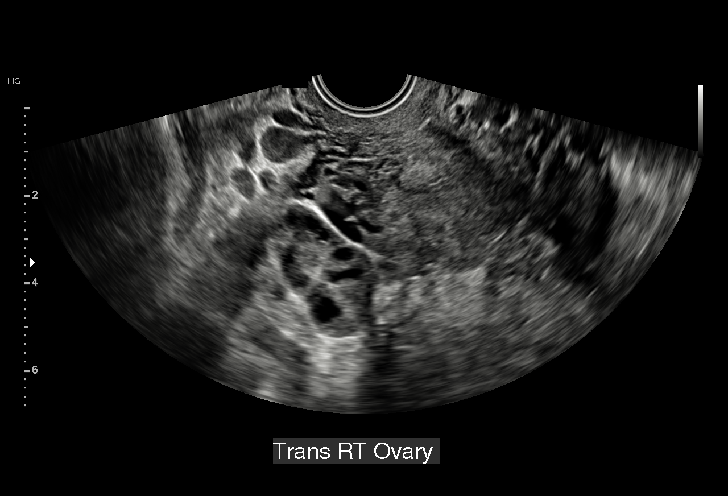
[im 29/46]
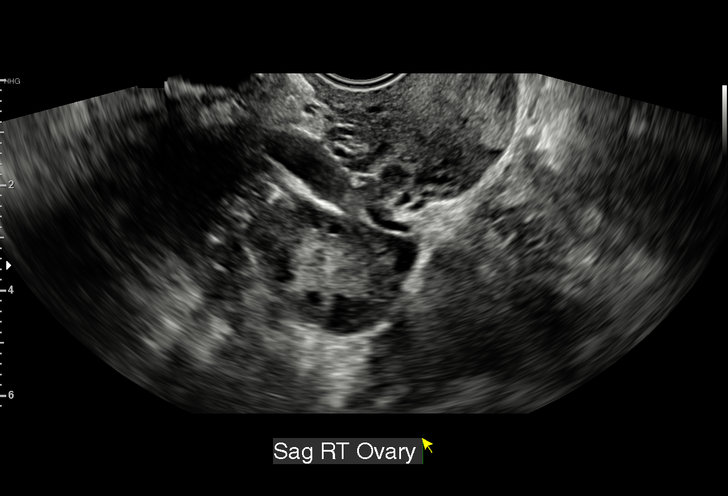
[im 32/46]
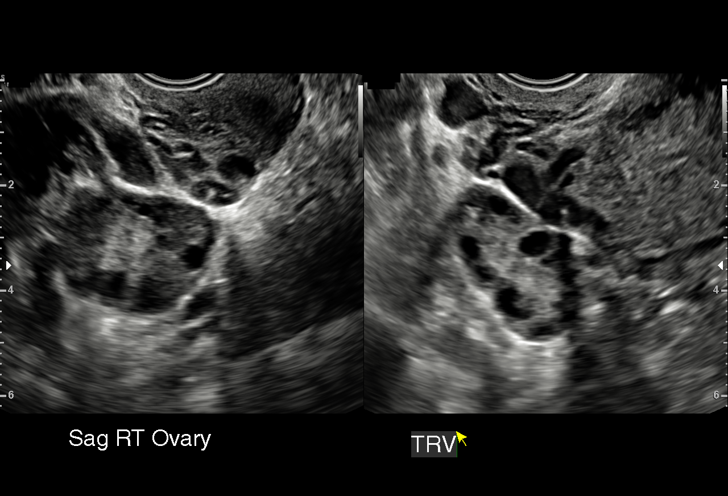
[im 36/46]
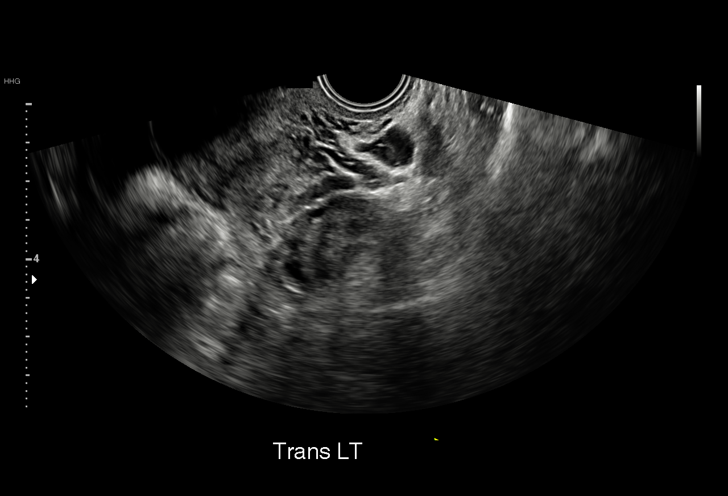
[im 38/46]
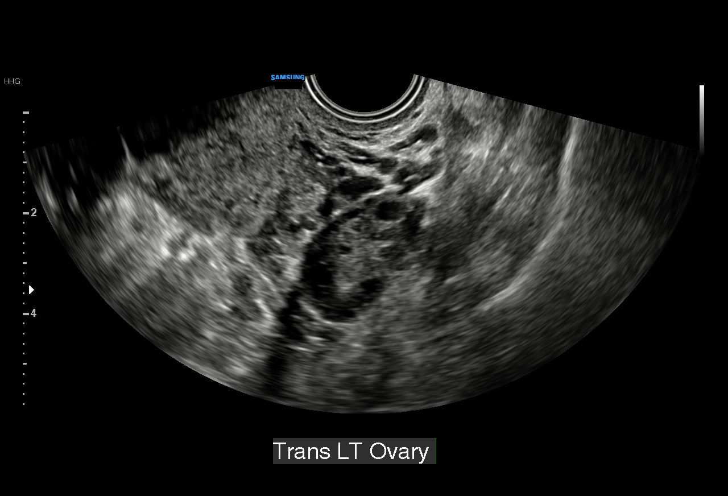
[im 42/46]
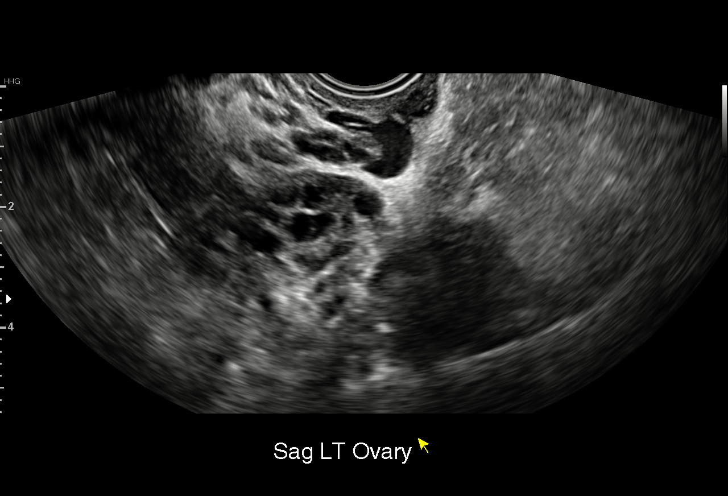
[im 46/46]
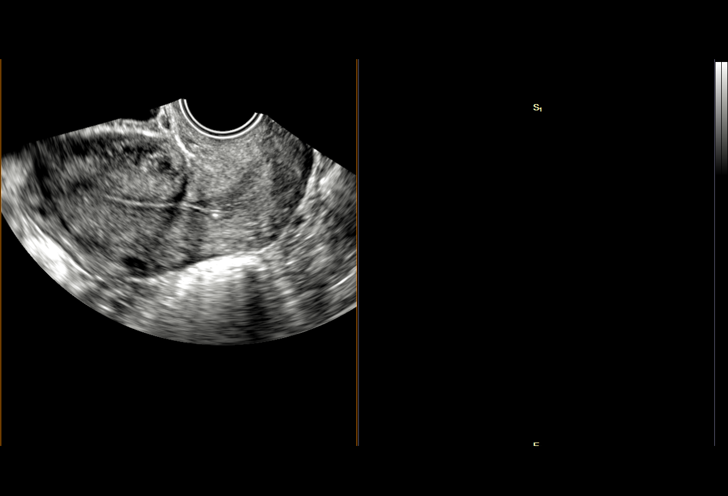

[15 of 25 positions shown; findings below may reference images not displayed]

FINDINGS: Uterus

Measurements: 7.8 x 3.7 x 6.0 cm. No fibroids or other mass
visualized.

Endometrium

Thickness: 2 mm. No focal abnormality visualized. IUD not
visualized.

Right ovary

Measurements: 3.2 x 2.5 x 2.3 cm. Normal appearance/no adnexal mass.

Left ovary

Measurements: 3.2 x 2.4 x 2.8 cm. Normal appearance/no adnexal mass.

Other findings:  No abnormal free fluid
IMPRESSION: 1. IUD not visualized.

2. Exam is otherwise unremarkable.

These results will be called to the ordering clinician or
representative by the Radiologist Assistant, and communication
documented in the PACS or zVision Dashboard.

## 2017-05-01 ENCOUNTER — Emergency Department (HOSPITAL_COMMUNITY)
Admission: EM | Admit: 2017-05-01 | Discharge: 2017-05-01 | Discharge status: Against Medical Advice | Payer: No Typology Code available for payment source

## 2017-05-01 NOTE — ED Triage Notes (Signed)
Pt called no answer 

## 2018-04-24 DIAGNOSIS — O368910 Maternal care for other specified fetal problems, first trimester, not applicable or unspecified: Secondary | ICD-10-CM | POA: Diagnosis not present

## 2018-04-24 DIAGNOSIS — O208 Other hemorrhage in early pregnancy: Secondary | ICD-10-CM | POA: Diagnosis not present

## 2018-04-24 DIAGNOSIS — Z3A01 Less than 8 weeks gestation of pregnancy: Secondary | ICD-10-CM | POA: Diagnosis not present

## 2018-04-24 DIAGNOSIS — N3090 Cystitis, unspecified without hematuria: Secondary | ICD-10-CM | POA: Diagnosis not present

## 2018-04-24 DIAGNOSIS — J111 Influenza due to unidentified influenza virus with other respiratory manifestations: Secondary | ICD-10-CM | POA: Diagnosis not present

## 2018-04-24 DIAGNOSIS — Z3009 Encounter for other general counseling and advice on contraception: Secondary | ICD-10-CM | POA: Diagnosis not present

## 2018-04-24 DIAGNOSIS — Z0389 Encounter for observation for other suspected diseases and conditions ruled out: Secondary | ICD-10-CM | POA: Diagnosis not present

## 2018-04-24 DIAGNOSIS — O98511 Other viral diseases complicating pregnancy, first trimester: Secondary | ICD-10-CM | POA: Diagnosis not present

## 2018-04-24 DIAGNOSIS — O2311 Infections of bladder in pregnancy, first trimester: Secondary | ICD-10-CM | POA: Diagnosis not present

## 2018-04-24 DIAGNOSIS — O26891 Other specified pregnancy related conditions, first trimester: Secondary | ICD-10-CM | POA: Diagnosis not present

## 2018-04-24 DIAGNOSIS — Z1388 Encounter for screening for disorder due to exposure to contaminants: Secondary | ICD-10-CM | POA: Diagnosis not present

## 2018-04-24 DIAGNOSIS — O99511 Diseases of the respiratory system complicating pregnancy, first trimester: Secondary | ICD-10-CM | POA: Diagnosis not present

## 2018-04-25 DIAGNOSIS — O208 Other hemorrhage in early pregnancy: Secondary | ICD-10-CM | POA: Diagnosis not present

## 2018-04-25 DIAGNOSIS — Z3A01 Less than 8 weeks gestation of pregnancy: Secondary | ICD-10-CM | POA: Diagnosis not present

## 2018-05-06 DIAGNOSIS — O26891 Other specified pregnancy related conditions, first trimester: Secondary | ICD-10-CM | POA: Diagnosis not present

## 2018-05-06 DIAGNOSIS — R109 Unspecified abdominal pain: Secondary | ICD-10-CM | POA: Diagnosis not present

## 2018-05-06 DIAGNOSIS — Z3A01 Less than 8 weeks gestation of pregnancy: Secondary | ICD-10-CM | POA: Diagnosis not present

## 2018-05-11 ENCOUNTER — Inpatient Hospital Stay (HOSPITAL_COMMUNITY): Payer: Medicaid Other

## 2018-05-11 ENCOUNTER — Inpatient Hospital Stay (HOSPITAL_COMMUNITY)
Admission: AD | Admit: 2018-05-11 | Discharge: 2018-05-12 | Disposition: A | Discharge status: Home / Self Care | Payer: Medicaid Other | Source: Ambulatory Visit | Attending: Obstetrics & Gynecology | Admitting: Obstetrics & Gynecology

## 2018-05-11 DIAGNOSIS — Z3A08 8 weeks gestation of pregnancy: Secondary | ICD-10-CM | POA: Insufficient documentation

## 2018-05-11 DIAGNOSIS — O200 Threatened abortion: Secondary | ICD-10-CM | POA: Diagnosis not present

## 2018-05-11 DIAGNOSIS — O208 Other hemorrhage in early pregnancy: Secondary | ICD-10-CM | POA: Diagnosis not present

## 2018-05-11 DIAGNOSIS — R1032 Left lower quadrant pain: Secondary | ICD-10-CM | POA: Insufficient documentation

## 2018-05-11 DIAGNOSIS — Z3A01 Less than 8 weeks gestation of pregnancy: Secondary | ICD-10-CM | POA: Diagnosis not present

## 2018-05-11 DIAGNOSIS — R109 Unspecified abdominal pain: Secondary | ICD-10-CM

## 2018-05-11 DIAGNOSIS — O26899 Other specified pregnancy related conditions, unspecified trimester: Secondary | ICD-10-CM

## 2018-05-11 LAB — URINALYSIS, ROUTINE W REFLEX MICROSCOPIC
Bacteria, UA: NONE SEEN
Bilirubin Urine: NEGATIVE
GLUCOSE, UA: NEGATIVE mg/dL
Hgb urine dipstick: NEGATIVE
Ketones, ur: NEGATIVE mg/dL
Nitrite: NEGATIVE
PH: 7 (ref 5.0–8.0)
Protein, ur: NEGATIVE mg/dL
SPECIFIC GRAVITY, URINE: 1.006 (ref 1.005–1.030)

## 2018-05-11 LAB — CBC
HEMATOCRIT: 39.7 % (ref 36.0–46.0)
Hemoglobin: 13.5 g/dL (ref 12.0–15.0)
MCH: 30.8 pg (ref 26.0–34.0)
MCHC: 34 g/dL (ref 30.0–36.0)
MCV: 90.4 fL (ref 80.0–100.0)
NRBC: 0 % (ref 0.0–0.2)
PLATELETS: 371 10*3/uL (ref 150–400)
RBC: 4.39 MIL/uL (ref 3.87–5.11)
RDW: 13 % (ref 11.5–15.5)
WBC: 13.2 10*3/uL — ABNORMAL HIGH (ref 4.0–10.5)

## 2018-05-11 LAB — WET PREP, GENITAL
SPERM: NONE SEEN
TRICH WET PREP: NONE SEEN
Yeast Wet Prep HPF POC: NONE SEEN

## 2018-05-11 LAB — POCT PREGNANCY, URINE: Preg Test, Ur: POSITIVE — AB

## 2018-05-11 LAB — HCG, QUANTITATIVE, PREGNANCY: hCG, Beta Chain, Quant, S: 35909 m[IU]/mL — ABNORMAL HIGH (ref <5)

## 2018-05-11 NOTE — Discharge Instructions (Signed)

## 2018-05-11 NOTE — MAU Note (Signed)
Pt here with c/o pain on left side for the last 3 weeks. Denies any bleeding. Had positive preg test [facility] de[facility].

## 2018-05-11 NOTE — MAU Provider Note (Signed)
History     CSN: [id number]  Arrival date and time: 05/11/18 8295   First Provider Initiated Contact with Patient 05/11/18 2319      Chief Complaint  Patient presents with  . Abdominal Pain   HPI Sharon Garner is a 28 y.o. [phone] at [redacted]w[redacted]d who presents with left lower abdominal pain. She states this has been ongoing since she found out she was pregnant. She rates the pain a 4/10 and has not tried anything for the pain. She denies any leaking or bleeding.   She was seen at [facility] on 1/3 and found to have a IUP measuring [redacted]w[redacted]d with no HR.   OB History    Gravida  4   Para  2   Term  2   Preterm      AB  1   Living  2     SAB  1   TAB      Ectopic      Multiple      Live Births  2           Past Medical History:  Diagnosis Date  . Migraine     Past Surgical History:  Procedure Laterality Date  . CESAREAN SECTION N/A 10/17/2012   Procedure: CESAREAN SECTION;  Surgeon: Tereso Newcomer, MD;  Location: [facility] ORS;  Service: Obstetrics;  Laterality: N/A;    Family History  Problem Relation Age of Onset  . Other Neg Hx   . Alcohol abuse Neg Hx   . Arthritis Neg Hx   . Asthma Neg Hx   . Birth defects Neg Hx   . Cancer Neg Hx   . COPD Neg Hx   . Depression Neg Hx   . Diabetes Neg Hx   . Drug abuse Neg Hx   . Early death Neg Hx   . Hearing loss Neg Hx   . Heart disease Neg Hx   . Hyperlipidemia Neg Hx   . Hypertension Neg Hx   . Kidney disease Neg Hx   . Learning disabilities Neg Hx   . Mental illness Neg Hx   . Mental retardation Neg Hx   . Miscarriages / Stillbirths Neg Hx   . Stroke Neg Hx   . Vision loss Neg Hx     Social History   Tobacco Use  . Smoking status: Never Smoker  . Smokeless tobacco: Never Used  Substance Use Topics  . Alcohol use: No  . Drug use: No    Allergies: No Known Allergies  Medications Prior to Admission  Medication Sig Dispense Refill Last Dose  . acetaminophen (TYLENOL) 500 MG tablet Take 500 mg by  mouth every 6 (six) hours as needed for mild pain, moderate pain, fever or headache.   05/11/2018 at 1200  . Prenatal Vit-Fe Fumarate-FA (MULTIVITAMIN-PRENATAL) 27-0.8 MG TABS tablet Take 1 tablet by mouth daily at 12 noon.   05/11/2018 at Unknown time  . fluticasone (FLONASE) 50 MCG/ACT nasal spray Place 2 sprays into both nostrils daily. 16 g 1 More than a month at Unknown time  . ibuprofen (ADVIL,MOTRIN) 800 MG tablet Take 1 tablet (800 mg total) by mouth 3 (three) times daily. 21 tablet 0 More than a month at Unknown time  . lidocaine (LIDODERM) 5 % Place 1 patch onto the skin daily. Remove & Discard patch within 12 hours or as directed by MD 30 patch 0 More than a month at Unknown time  . methocarbamol (ROBAXIN) 500 MG tablet Take  1 tablet (500 mg total) by mouth 2 (two) times daily. 20 tablet 0 More than a month at Unknown time    Review of Systems  Constitutional: Negative.  Negative for fatigue and fever.  HENT: Negative.   Respiratory: Negative.  Negative for shortness of breath.   Cardiovascular: Negative.  Negative for chest pain.  Gastrointestinal: Positive for abdominal pain. Negative for constipation, diarrhea, nausea and vomiting.  Genitourinary: Negative.  Negative for dysuria, vaginal bleeding and vaginal discharge.  Neurological: Negative.  Negative for dizziness and headaches.   Physical Exam   Blood pressure 113/70, pulse 89, temperature 98.5 F (36.9 C), temperature source Oral, resp. rate 18, height 5\' 2"  (1.575 m), weight 73.9 kg.  Physical Exam  Nursing note and vitals reviewed. Constitutional: She is oriented to person, place, and time. She appears well-developed and well-nourished. No distress.  HENT:  Head: Normocephalic.  Eyes: Pupils are equal, round, and reactive to light.  Cardiovascular: Normal rate, regular rhythm and normal heart sounds.  Respiratory: Effort normal and breath sounds normal. No respiratory distress.  GI: Soft. Bowel sounds are normal. She  exhibits no distension. There is no abdominal tenderness.  Neurological: She is alert and oriented to person, place, and time. She has normal reflexes. She displays normal reflexes. She exhibits normal muscle tone. Coordination normal.  Skin: Skin is warm and dry.  Psychiatric: She has a normal mood and affect. Her behavior is normal. Judgment and thought content normal.    MAU Course  Procedures Results for orders placed or performed during the hospital encounter of 05/11/18 (from the past 24 hour(s))  Urinalysis, Routine w reflex microscopic     Status: Abnormal   Collection Time: 05/11/18  9:20 PM  Result Value Ref Range   Color, Urine STRAW (A) YELLOW   APPearance CLEAR CLEAR   Specific Gravity, Urine 1.006 1.005 - 1.030   pH 7.0 5.0 - 8.0   Glucose, UA NEGATIVE NEGATIVE mg/dL   Hgb urine dipstick NEGATIVE NEGATIVE   Bilirubin Urine NEGATIVE NEGATIVE   Ketones, ur NEGATIVE NEGATIVE mg/dL   Protein, ur NEGATIVE NEGATIVE mg/dL   Nitrite NEGATIVE NEGATIVE   Leukocytes, UA TRACE (A) NEGATIVE   RBC / HPF 0-5 0 - 5 RBC/hpf   WBC, UA 0-5 0 - 5 WBC/hpf   Bacteria, UA NONE SEEN NONE SEEN   Squamous Epithelial / LPF 0-5 0 - 5  Pregnancy, urine POC     Status: Abnormal   Collection Time: 05/11/18  9:23 PM  Result Value Ref Range   Preg Test, Ur POSITIVE (A) NEGATIVE  Wet prep, genital     Status: Abnormal   Collection Time: 05/11/18 10:44 PM  Result Value Ref Range   Yeast Wet Prep HPF POC NONE SEEN NONE SEEN   Trich, Wet Prep NONE SEEN NONE SEEN   Clue Cells Wet Prep HPF POC PRESENT (A) NONE SEEN   WBC, Wet Prep HPF POC FEW (A) NONE SEEN   Sperm NONE SEEN   CBC     Status: Abnormal   Collection Time: 05/11/18 10:50 PM  Result Value Ref Range   WBC 13.2 (H) 4.0 - 10.5 K/uL   RBC 4.39 3.87 - 5.11 MIL/uL   Hemoglobin 13.5 12.0 - 15.0 g/dL   HCT 16.139.7 09.636.0 - 04.546.0 %   MCV 90.4 80.0 - 100.0 fL   MCH 30.8 26.0 - 34.0 pg   MCHC 34.0 30.0 - 36.0 g/dL   RDW 40.913.0 81.111.5 - 91.415.5 %  Platelets 371 150 - 400 K/uL   nRBC 0.0 0.0 - 0.2 %  hCG, quantitative, pregnancy     Status: Abnormal   Collection Time: 05/11/18 10:50 PM  Result Value Ref Range   hCG, Beta Chain, Quant, S 35,909 (H) <5 mIU/mL   US Ob Less Than 14 Weeks With Ob Transvaginal  Result Date: 05/11/2018 CLINICAL DATA:  Initial evaluation for acute left-sided abdominal pain, early pregnancy. EXAM: OBSTETRIC <14 WK [place] AND TRANSVAGINAL OB US TECHNIQUE: Both transabdominal and transvaginal ultrasound examinations were performed for complete evaluation of the gestation as well as the maternal uterus, adnexal regions, and pelvic cul-de-sac. Transvaginal technique was performed to assess early pregnancy. COMPARISON:  None. FINDINGS: Intrauterine gestational sac: Single Yolk sac:  Present Embryo:  Present Cardiac Activity: Negative. Heart Rate: N/A  bpm CRL: 3.6 mm   6 w   0 d                  [place] EDC: 01/04/2019 Subchorionic hemorrhage: Small subchorionic hemorrhage measuring 6 x 7 x 12 mm without associated mass effect. Maternal uterus/adnexae: Ovaries normal in appearance bilaterally. Small corpus luteal cyst on the left. No free fluid within the pelvis. IMPRESSION: 1. Single intrauterine pregnancy with internal yolk sac and embryo, crown-rump length measuring 3.6 mm, but no detectable cardiac activity at this time. Findings are suspicious but not yet definitive for failed pregnancy. Recommend follow-up US in 10-14 days for definitive diagnosis. This recommendation follows SRU consensus guidelines: Diagnostic Criteria for Nonviable Pregnancy Early in the First Trimester. Malva Limes Med 2013; 010:2725-36. 2. Small subchorionic hemorrhage without associated mass effect. 3. Small left ovarian corpus luteal cyst, which could contribute to left-sided abdominal pain. Electronically Signed   By: Rise Mu M.D.   On: 05/11/2018 23:37    MDM UA CBC ABO/Rh- A Pos Wet prep and gc/chlamydia  Attempted limited bedside u/s but  unable to see.  US OB Comp Less 14 weeks with Transvaginal  Assessment and Plan   1. Threatened miscarriage   2. Abdominal pain affecting pregnancy    -Discharge home in stable condition -Miscarrige precautions discussed -Patient advised to follow-up with [facility] ultrasound in 10 days to confirm viability. -Patient may return to MAU as needed or if her condition were to change or worsen  Rolm Bookbinder CNM 05/11/2018, 11:19 PM

## 2018-05-12 LAB — GC/CHLAMYDIA PROBE AMP (~~LOC~~) NOT AT ARMC
CHLAMYDIA, DNA PROBE: NEGATIVE
NEISSERIA GONORRHEA: NEGATIVE

## 2018-05-13 DIAGNOSIS — O021 Missed abortion: Secondary | ICD-10-CM | POA: Insufficient documentation

## 2018-05-13 DIAGNOSIS — N8312 Corpus luteum cyst of left ovary: Secondary | ICD-10-CM | POA: Diagnosis not present

## 2018-05-15 DIAGNOSIS — O029 Abnormal product of conception, unspecified: Secondary | ICD-10-CM | POA: Diagnosis not present

## 2018-05-15 DIAGNOSIS — Z3A01 Less than 8 weeks gestation of pregnancy: Secondary | ICD-10-CM | POA: Diagnosis not present

## 2018-05-15 DIAGNOSIS — O021 Missed abortion: Secondary | ICD-10-CM | POA: Diagnosis not present

## 2018-09-18 DIAGNOSIS — Z3201 Encounter for pregnancy test, result positive: Secondary | ICD-10-CM | POA: Diagnosis not present

## 2018-09-18 DIAGNOSIS — O3680X0 Pregnancy with inconclusive fetal viability, not applicable or unspecified: Secondary | ICD-10-CM | POA: Diagnosis not present

## 2018-09-18 DIAGNOSIS — R102 Pelvic and perineal pain: Secondary | ICD-10-CM | POA: Diagnosis not present

## 2018-09-18 DIAGNOSIS — R1032 Left lower quadrant pain: Secondary | ICD-10-CM | POA: Diagnosis not present

## 2018-09-18 DIAGNOSIS — O26891 Other specified pregnancy related conditions, first trimester: Secondary | ICD-10-CM | POA: Diagnosis not present

## 2018-09-18 DIAGNOSIS — Z3A01 Less than 8 weeks gestation of pregnancy: Secondary | ICD-10-CM | POA: Diagnosis not present

## 2018-09-18 DIAGNOSIS — O219 Vomiting of pregnancy, unspecified: Secondary | ICD-10-CM | POA: Diagnosis not present

## 2018-09-26 DIAGNOSIS — Z3689 Encounter for other specified antenatal screening: Secondary | ICD-10-CM | POA: Diagnosis not present

## 2018-09-26 DIAGNOSIS — Z3A01 Less than 8 weeks gestation of pregnancy: Secondary | ICD-10-CM | POA: Diagnosis not present

## 2018-09-26 DIAGNOSIS — R87610 Atypical squamous cells of undetermined significance on cytologic smear of cervix (ASC-US): Secondary | ICD-10-CM | POA: Diagnosis not present

## 2018-09-26 DIAGNOSIS — Z01411 Encounter for gynecological examination (general) (routine) with abnormal findings: Secondary | ICD-10-CM | POA: Diagnosis not present

## 2018-10-17 DIAGNOSIS — O210 Mild hyperemesis gravidarum: Secondary | ICD-10-CM | POA: Diagnosis not present

## 2018-10-17 DIAGNOSIS — Z3201 Encounter for pregnancy test, result positive: Secondary | ICD-10-CM | POA: Diagnosis not present

## 2018-10-17 DIAGNOSIS — Z3A10 10 weeks gestation of pregnancy: Secondary | ICD-10-CM | POA: Diagnosis not present

## 2018-10-23 DIAGNOSIS — R82998 Other abnormal findings in urine: Secondary | ICD-10-CM | POA: Diagnosis not present

## 2018-10-23 DIAGNOSIS — Z3490 Encounter for supervision of normal pregnancy, unspecified, unspecified trimester: Secondary | ICD-10-CM | POA: Insufficient documentation

## 2018-11-07 DIAGNOSIS — R1031 Right lower quadrant pain: Secondary | ICD-10-CM | POA: Diagnosis not present

## 2018-11-07 DIAGNOSIS — Z3A12 12 weeks gestation of pregnancy: Secondary | ICD-10-CM | POA: Diagnosis not present

## 2018-11-07 DIAGNOSIS — R1032 Left lower quadrant pain: Secondary | ICD-10-CM | POA: Diagnosis not present

## 2018-11-07 DIAGNOSIS — O209 Hemorrhage in early pregnancy, unspecified: Secondary | ICD-10-CM | POA: Diagnosis not present

## 2018-11-07 DIAGNOSIS — O26891 Other specified pregnancy related conditions, first trimester: Secondary | ICD-10-CM | POA: Diagnosis not present

## 2018-11-07 DIAGNOSIS — O219 Vomiting of pregnancy, unspecified: Secondary | ICD-10-CM | POA: Diagnosis not present

## 2018-11-08 DIAGNOSIS — O209 Hemorrhage in early pregnancy, unspecified: Secondary | ICD-10-CM | POA: Diagnosis not present

## 2018-11-08 DIAGNOSIS — Z3A12 12 weeks gestation of pregnancy: Secondary | ICD-10-CM | POA: Diagnosis not present

## 2018-11-09 ENCOUNTER — Inpatient Hospital Stay (HOSPITAL_COMMUNITY)
Admission: AD | Admit: 2018-11-09 | Discharge: 2018-11-09 | Disposition: A | Discharge status: Home / Self Care | Payer: Medicaid Other | Attending: Family Medicine | Admitting: Family Medicine

## 2018-11-09 ENCOUNTER — Encounter (HOSPITAL_COMMUNITY): Payer: Self-pay

## 2018-11-09 ENCOUNTER — Other Ambulatory Visit: Payer: Self-pay

## 2018-11-09 DIAGNOSIS — O209 Hemorrhage in early pregnancy, unspecified: Secondary | ICD-10-CM | POA: Diagnosis not present

## 2018-11-09 DIAGNOSIS — O9989 Other specified diseases and conditions complicating pregnancy, childbirth and the puerperium: Secondary | ICD-10-CM | POA: Diagnosis not present

## 2018-11-09 DIAGNOSIS — O34219 Maternal care for unspecified type scar from previous cesarean delivery: Secondary | ICD-10-CM | POA: Diagnosis not present

## 2018-11-09 DIAGNOSIS — N949 Unspecified condition associated with female genital organs and menstrual cycle: Secondary | ICD-10-CM

## 2018-11-09 DIAGNOSIS — O219 Vomiting of pregnancy, unspecified: Secondary | ICD-10-CM | POA: Insufficient documentation

## 2018-11-09 DIAGNOSIS — Z3A12 12 weeks gestation of pregnancy: Secondary | ICD-10-CM | POA: Diagnosis not present

## 2018-11-09 DIAGNOSIS — R1032 Left lower quadrant pain: Secondary | ICD-10-CM | POA: Diagnosis not present

## 2018-11-09 DIAGNOSIS — O26891 Other specified pregnancy related conditions, first trimester: Secondary | ICD-10-CM

## 2018-11-09 LAB — POCT PREGNANCY, URINE: Preg Test, Ur: POSITIVE — AB

## 2018-11-09 LAB — URINALYSIS, ROUTINE W REFLEX MICROSCOPIC
Bilirubin Urine: NEGATIVE
Glucose, UA: NEGATIVE mg/dL
Hgb urine dipstick: NEGATIVE
Ketones, ur: 5 mg/dL — AB
Leukocytes,Ua: NEGATIVE
Nitrite: NEGATIVE
Protein, ur: NEGATIVE mg/dL
Specific Gravity, Urine: 1.009 (ref 1.005–1.030)
pH: 6 (ref 5.0–8.0)

## 2018-11-09 LAB — CBC WITH DIFFERENTIAL/PLATELET
Abs Immature Granulocytes: 0.12 10*3/uL — ABNORMAL HIGH (ref 0.00–0.07)
Basophils Absolute: 0 10*3/uL (ref 0.0–0.1)
Basophils Relative: 0 %
Eosinophils Absolute: 0.2 10*3/uL (ref 0.0–0.5)
Eosinophils Relative: 2 %
HCT: 41.1 % (ref 36.0–46.0)
Hemoglobin: 13.9 g/dL (ref 12.0–15.0)
Immature Granulocytes: 1 %
Lymphocytes Relative: 18 %
Lymphs Abs: 1.9 10*3/uL (ref 0.7–4.0)
MCH: 30.9 pg (ref 26.0–34.0)
MCHC: 33.8 g/dL (ref 30.0–36.0)
MCV: 91.3 fL (ref 80.0–100.0)
Monocytes Absolute: 0.8 10*3/uL (ref 0.1–1.0)
Monocytes Relative: 7 %
Neutro Abs: 7.8 10*3/uL — ABNORMAL HIGH (ref 1.7–7.7)
Neutrophils Relative %: 72 %
Platelets: 325 10*3/uL (ref 150–400)
RBC: 4.5 MIL/uL (ref 3.87–5.11)
RDW: 13 % (ref 11.5–15.5)
WBC: 10.8 10*3/uL — ABNORMAL HIGH (ref 4.0–10.5)
nRBC: 0 % (ref 0.0–0.2)

## 2018-11-09 LAB — COMPREHENSIVE METABOLIC PANEL
ALT: 36 U/L (ref 0–44)
AST: 36 U/L (ref 15–41)
Albumin: 3.3 g/dL — ABNORMAL LOW (ref 3.5–5.0)
Alkaline Phosphatase: 48 U/L (ref 38–126)
Anion gap: 9 (ref 5–15)
BUN: 5 mg/dL — ABNORMAL LOW (ref 6–20)
CO2: 19 mmol/L — ABNORMAL LOW (ref 22–32)
Calcium: 8.7 mg/dL — ABNORMAL LOW (ref 8.9–10.3)
Chloride: 109 mmol/L (ref 98–111)
Creatinine, Ser: 0.55 mg/dL (ref 0.44–1.00)
GFR calc Af Amer: >60 mL/min (ref >60)
GFR calc non Af Amer: >60 mL/min (ref >60)
Glucose, Bld: 88 mg/dL (ref 70–99)
Potassium: 4.5 mmol/L (ref 3.5–5.1)
Sodium: 137 mmol/L (ref 135–145)
Total Bilirubin: 0.4 mg/dL (ref 0.3–1.2)
Total Protein: 6.7 g/dL (ref 6.5–8.1)

## 2018-11-09 MED ORDER — BUTALBITAL-APAP-CAFFEINE 50-325-40 MG PO TABS
1.0000 | ORAL_TABLET | Freq: Once | ORAL | Status: AC
  Administered 2018-11-09: 1 via ORAL
  Filled 2018-11-09: qty 1

## 2018-11-09 MED ORDER — LACTATED RINGERS IV BOLUS: 1000.0000 mL | Freq: Once | INTRAVENOUS | Status: DC

## 2018-11-09 MED ORDER — FAMOTIDINE 20 MG PO TABS: 20.0000 mg | ORAL_TABLET | Freq: Two times a day (BID) | ORAL | 1 refills | Status: DC

## 2018-11-09 MED ORDER — PROMETHAZINE HCL 25 MG/ML IJ SOLN
25.0000 mg | Freq: Once | INTRAVENOUS | Status: AC
  Administered 2018-11-09: 25 mg via INTRAVENOUS
  Filled 2018-11-09: qty 1

## 2018-11-09 MED ORDER — PROMETHAZINE HCL 12.5 MG PO TABS: 12.5000 mg | ORAL_TABLET | Freq: Four times a day (QID) | ORAL | 0 refills | Status: DC | PRN

## 2018-11-09 MED ORDER — FAMOTIDINE IN NACL 20-0.9 MG/50ML-% IV SOLN
20.0000 mg | Freq: Once | INTRAVENOUS | Status: AC
  Administered 2018-11-09: 20 mg via INTRAVENOUS
  Filled 2018-11-09: qty 50

## 2018-11-09 MED ORDER — ACETAMINOPHEN 500 MG PO TABS: 1000.0000 mg | ORAL_TABLET | Freq: Once | ORAL | Status: DC

## 2018-11-09 NOTE — Discharge Instructions (Signed)
Hyperemesis Gravidarum °Hyperemesis gravidarum is a severe form of nausea and vomiting that happens during pregnancy. Hyperemesis is worse than morning sickness. It may cause you to have nausea or vomiting all day for many days. It may keep you from eating and drinking enough food and liquids, which can lead to dehydration, malnutrition, and weight loss. Hyperemesis usually occurs during the first half (the first 20 weeks) of pregnancy. It often goes away once a woman is in her second half of pregnancy. However, sometimes hyperemesis continues through an entire pregnancy. °What are the causes? °The cause of this condition is not known. It may be related to changes in chemicals (hormones) in the body during pregnancy, such as the high level of pregnancy hormone (human chorionic gonadotropin) or the increase in the female sex hormone (estrogen). °What are the signs or symptoms? °Symptoms of this condition include: °· Nausea that does not go away. °· Vomiting that does not allow you to keep any food down. °· Weight loss. °· Body fluid loss (dehydration). °· Having no desire to eat, or not liking food that you have previously enjoyed. °How is this diagnosed? °This condition may be diagnosed based on: °· A physical exam. °· Your medical history. °· Your symptoms. °· Blood tests. °· Urine tests. °How is this treated? °This condition is managed by controlling symptoms. This may include: °· Following an eating plan. This can help lessen nausea and vomiting. °· Taking prescription medicines. °An eating plan and medicines are often used together to help control symptoms. If medicines do not help relieve nausea and vomiting, you may need to receive fluids through an IV at the hospital. °Follow these instructions at home: °Eating and drinking ° °· Avoid the following: °? Drinking fluids with meals. Try not to drink anything during the 30 minutes before and after your meals. °? Drinking more than 1 cup of fluid at a  time. °? Eating foods that trigger your symptoms. These may include spicy foods, coffee, high-fat foods, very sweet foods, and acidic foods. °? Skipping meals. Nausea can be more intense on an empty stomach. If you cannot tolerate food, do not force it. Try sucking on ice chips or other frozen items and make up for missed calories later. °? Lying down within 2 hours after eating. °? Being exposed to environmental triggers. These may include food smells, smoky rooms, closed spaces, rooms with strong smells, warm or humid places, overly loud and noisy rooms, and rooms with motion or flickering lights. Try eating meals in a well-ventilated area that is free of strong smells. °? Quick and sudden changes in your movement. °? Taking iron pills and multivitamins that contain iron. If you take prescription iron pills, do not stop taking them unless your health care provider approves. °? Preparing food. The smell of food can spoil your appetite or trigger nausea. °· To help relieve your symptoms: °? Listen to your body. Everyone is different and has different preferences. Find what works best for you. °? Eat and drink slowly. °? Eat 5-6 small meals daily instead of 3 large meals. Eating small meals and snacks can help you avoid an empty stomach. °? In the morning, before getting out of bed, eat a couple of crackers to avoid moving around on an empty stomach. °? Try eating starchy foods as these are usually tolerated well. Examples include cereal, toast, bread, potatoes, pasta, rice, and pretzels. °? Include at least 1 serving of protein with your meals and snacks. Protein options include   lean meats, poultry, seafood, beans, nuts, nut butters, eggs, cheese, and yogurt. °? Try eating a protein-rich snack before bed. Examples of a protein-rick snack include cheese and crackers or a peanut butter sandwich made with 1 slice of whole-wheat bread and 1 tsp (5 g) of peanut butter. °? Eat or suck on things that have ginger in them.  It may help relieve nausea. Add ¼ tsp ground ginger to hot tea or choose ginger tea. °? Try drinking 100% fruit juice or an electrolyte drink. An electrolyte drink contains sodium, potassium, and chloride. °? Drink fluids that are cold, clear, and carbonated or sour. Examples include lemonade, ginger ale, lemon-lime soda, ice water, and sparkling water. °? Brush your teeth or use a mouth rinse after meals. °? Talk with your health care provider about starting a supplement of vitamin B6. °General instructions °· Take over-the-counter and prescription medicines only as told by your health care provider. °· Follow instructions from your health care provider about eating or drinking restrictions. °· Continue to take your prenatal vitamins as told by your health care provider. If you are having trouble taking your prenatal vitamins, talk with your health care provider about different options. °· Keep all follow-up and pre-birth (prenatal) visits as told by your health care provider. This is important. °Contact a health care provider if: °· You have pain in your abdomen. °· You have a severe headache. °· You have vision problems. °· You are losing weight. °· You feel weak or dizzy. °Get help right away if: °· You cannot drink fluids without vomiting. °· You vomit blood. °· You have constant nausea and vomiting. °· You are very weak. °· You faint. °· You have a fever and your symptoms suddenly get worse. °Summary °· Hyperemesis gravidarum is a severe form of nausea and vomiting that happens during pregnancy. °· Making some changes to your eating habits may help relieve nausea and vomiting. °· This condition may be managed with medicine. °· If medicines do not help relieve nausea and vomiting, you may need to receive fluids through an IV at the hospital. °This information is not intended to replace advice given to you by your health care provider. Make sure you discuss any questions you have with your health care  provider. °Document Released: 04/09/2005 Document Revised: 04/29/2017 Document Reviewed: 12/07/2015 °Elsevier Patient Education © 2020 [employer] °Morning Sickness ° °Morning sickness is when a woman feels nauseous during pregnancy. This nauseous feeling may or may not come with vomiting. It often occurs in the morning, but it can be a problem at any time of day. Morning sickness is most common during the first trimester. In some cases, it may continue throughout pregnancy. Although morning sickness is unpleasant, it is usually harmless unless the woman develops severe and continual vomiting (hyperemesis gravidarum), a condition that requires more intense treatment. °What are the causes? °The exact cause of this condition is not known, but it seems to be related to normal hormonal changes that occur in pregnancy. °What increases the risk? °You are more likely to develop this condition if: °· You experienced nausea or vomiting before your pregnancy. °· You had morning sickness during a previous pregnancy. °· You are pregnant with more than one baby, such as twins. °What are the signs or symptoms? °Symptoms of this condition include: °· Nausea. °· Vomiting. °How is this diagnosed? °This condition is usually diagnosed based on your signs and symptoms. °How is this treated? °In many cases, treatment is not needed   for this condition. Making some changes to what you eat may help to control symptoms. Your health care provider may also prescribe or recommend: °· Vitamin B6 supplements. °· Anti-nausea medicines. °· Ginger. °Follow these instructions at home: °Medicines °· Take over-the-counter and prescription medicines only as told by your health care provider. Do not use any prescription, over-the-counter, or herbal medicines for morning sickness without first talking with your health care provider. °· Taking multivitamins before getting pregnant can prevent or decrease the severity of morning sickness in most  women. °Eating and drinking °· Eat a piece of dry toast or crackers before getting out of bed in the morning. °· Eat 5 or 6 small meals a day. °· Eat dry and bland foods, such as rice or a baked potato. Foods that are high in carbohydrates are often helpful. °· Avoid greasy, fatty, and spicy foods. °· Have someone cook for you if the smell of any food causes nausea and vomiting. °· If you feel nauseous after taking prenatal vitamins, take the vitamins at night or with a snack. °· Snack on protein foods between meals if you are hungry. Nuts, yogurt, and cheese are good options. °· Drink fluids throughout the day. °· Try ginger ale made with real ginger, ginger tea made from fresh grated ginger, or ginger candies. °General instructions °· Do not use any products that contain nicotine or tobacco, such as cigarettes and e-cigarettes. If you need help quitting, ask your health care provider. °· Get an air purifier to keep the air in your house free of odors. °· Get plenty of fresh air. °· Try to avoid odors that trigger your nausea. °· Consider trying these methods to help relieve symptoms: °? Wearing an acupressure wristband. These wristbands are often worn for seasickness. °? Acupuncture. °Contact a health care provider if: °· Your home remedies are not working and you need medicine. °· You feel dizzy or light-headed. °· You are losing weight. °Get help right away if: °· You have persistent and uncontrolled nausea and vomiting. °· You faint. °· You have severe pain in your abdomen. °Summary °· Morning sickness is when a woman feels nauseous during pregnancy. This nauseous feeling may or may not come with vomiting. °· Morning sickness is most common during the first trimester. °· It often occurs in the morning, but it can be a problem at any time of day. °· In many cases, treatment is not needed for this condition. Making some changes to what you eat may help to control symptoms. °This information is not intended to  replace advice given to you by your health care provider. Make sure you discuss any questions you have with your health care provider. °Document Released: 05/31/2006 Document Revised: 03/22/2017 Document Reviewed: 05/12/2016 °Elsevier Patient Education © 2020 [employer] ° °

## 2018-11-09 NOTE — MAU Note (Signed)
Sharon Garner is a 28 y.o. at [redacted]w[redacted]d here in MAU reporting: reports left sided abdominal pain for 4 days, states it is getting worse. Started spotting 2 days ago. States she is unable to eat, she vomits when she does eat/drink. Lots of nausea,  Onset of complaint: 4 days  Pain score: 7/10  Vitals:   11/09/18 1706  BP: 131/75  Pulse: 95  Resp: 18  Temp: 98.4 F (36.9 C)  SpO2: 97%     FHT: 154  Lab orders placed from triage: UA, UPT (pt did not give a large enough sample to send UA)

## 2018-11-09 NOTE — MAU Provider Note (Addendum)
History     CSN: [id number]  Arrival date and time: 11/09/18 6010   First Provider Initiated Contact with Patient 11/09/18 1713      Chief Complaint  Patient presents with  . Abdominal Pain  . Vaginal Bleeding  . Emesis   HPI  [name] is a 28 y.o. [phone] at [redacted]w[redacted]d who presents to MAU today with complaint of LLQ abdominal pain x 4 days. The patient states that pain is cramping in nature. She rates the pain at 9/10 with ambulation and 7/10 with rest. She has tried Tylenol without relief. The pain also radiates to her lower back. She denies recent intercourse, but did have a transvaginal US 2 days ago in [facility]. She denies dysuria or fever. She has had N/V throughout the pregnancy with little relief. Initially Zofran and Diclegis were helping, but they are no longer working. Last dose was 1-2 days ago. She denies diarrhea, but has had some constipation. She states that she hasn't been able to keep anything down the last few days.   OB History    Gravida  5   Para  2   Term  2   Preterm      AB  2   Living  2     SAB  2   TAB      Ectopic      Multiple      Live Births  2           Past Medical History:  Diagnosis Date  . Migraine     Past Surgical History:  Procedure Laterality Date  . CESAREAN SECTION N/A 10/17/2012   Procedure: CESAREAN SECTION;  Surgeon: Osborne Oman, MD;  Location: [facility];  Service: Obstetrics;  Laterality: N/A;    Family History  Problem Relation Age of Onset  . Other Neg Hx   . Alcohol abuse Neg Hx   . Arthritis Neg Hx   . Asthma Neg Hx   . Birth defects Neg Hx   . Cancer Neg Hx   . COPD Neg Hx   . Depression Neg Hx   . Diabetes Neg Hx   . Drug abuse Neg Hx   . Early death Neg Hx   . Hearing loss Neg Hx   . Heart disease Neg Hx   . Hyperlipidemia Neg Hx   . Hypertension Neg Hx   . Kidney disease Neg Hx   . Learning disabilities Neg Hx   . Mental illness Neg Hx   . Mental retardation Neg Hx   .  Miscarriages / Stillbirths Neg Hx   . Stroke Neg Hx   . Vision loss Neg Hx     Social History   Tobacco Use  . Smoking status: Never Smoker  . Smokeless tobacco: Never Used  Substance Use Topics  . Alcohol use: No  . Drug use: No    Allergies: No Known Allergies  Medications Prior to Admission  Medication Sig Dispense Refill Last Dose  . acetaminophen (TYLENOL) 500 MG tablet Take 500 mg by mouth every 6 (six) hours as needed for mild pain, moderate pain, fever or headache.     . fluticasone (FLONASE) 50 MCG/ACT nasal spray Place 2 sprays into both nostrils daily. 16 g 1   . lidocaine (LIDODERM) 5 % Place 1 patch onto the skin daily. Remove & Discard patch within 12 hours or as directed by MD 30 patch 0   . Prenatal Vit-Fe Fumarate-FA (MULTIVITAMIN-PRENATAL)  27-0.8 MG TABS tablet Take 1 tablet by mouth daily at 12 noon.       Review of Systems  Constitutional: Negative for fever.  Gastrointestinal: Positive for abdominal pain, nausea and vomiting. Negative for constipation and diarrhea.  Genitourinary: Positive for vaginal bleeding. Negative for dysuria, frequency, urgency and vaginal discharge.  Musculoskeletal: Positive for back pain.   Physical Exam   Blood pressure 104/60, pulse 83, temperature 98.4 F (36.9 C), temperature source Oral, resp. rate 18, height 5\' 2"  (1.575 m), weight 163 lb 11.2 oz (74.3 kg), SpO2 97 %, unknown if currently breastfeeding.  Physical Exam  Nursing note and vitals reviewed. Constitutional: She is oriented to person, place, and time. She appears well-developed and well-nourished. No distress.  HENT:  Head: Normocephalic and atraumatic.  Cardiovascular: Normal rate.  Respiratory: Effort normal.  GI: Soft. She exhibits no distension and no mass. There is no abdominal tenderness. There is no rebound and no guarding.  Genitourinary: Uterus is enlarged. Uterus is not tender. Cervix exhibits no motion tenderness, no discharge and no friability.  Right adnexum displays no mass and no tenderness. Left adnexum displays no mass and no tenderness.    No vaginal discharge or bleeding.  No bleeding in the vagina.  Neurological: She is alert and oriented to person, place, and time.  Skin: Skin is warm and dry. No erythema.  Psychiatric: She has a normal mood and affect.  Dilation: Closed Effacement (%): Thick Cervical Position: Posterior Exam by:: Vonzella NippleJulie Wenzel, PA-C    Results for orders placed or performed during the hospital encounter of 11/09/18 (from the past 24 hour(s))  Pregnancy, urine POC     Status: Abnormal   Collection Time: 11/09/18  4:53 PM  Result Value Ref Range   Preg Test, Ur POSITIVE (A) NEGATIVE  CBC with Differential/Platelet     Status: Abnormal   Collection Time: 11/09/18  6:10 PM  Result Value Ref Range   WBC 10.8 (H) 4.0 - 10.5 K/uL   RBC 4.50 3.87 - 5.11 MIL/uL   Hemoglobin 13.9 12.0 - 15.0 g/dL   HCT 40.941.1 81.136.0 - 91.446.0 %   MCV 91.3 80.0 - 100.0 fL   MCH 30.9 26.0 - 34.0 pg   MCHC 33.8 30.0 - 36.0 g/dL   RDW 78.213.0 95.611.5 - 21.315.5 %   Platelets 325 150 - 400 K/uL   nRBC 0.0 0.0 - 0.2 %   Neutrophils Relative % 72 %   Neutro Abs 7.8 (H) 1.7 - 7.7 K/uL   Lymphocytes Relative 18 %   Lymphs Abs 1.9 0.7 - 4.0 K/uL   Monocytes Relative 7 %   Monocytes Absolute 0.8 0.1 - 1.0 K/uL   Eosinophils Relative 2 %   Eosinophils Absolute 0.2 0.0 - 0.5 K/uL   Basophils Relative 0 %   Basophils Absolute 0.0 0.0 - 0.1 K/uL   Immature Granulocytes 1 %   Abs Immature Granulocytes 0.12 (H) 0.00 - 0.07 K/uL  Comprehensive metabolic panel     Status: Abnormal   Collection Time: 11/09/18  6:10 PM  Result Value Ref Range   Sodium 137 135 - 145 mmol/L   Potassium 4.5 3.5 - 5.1 mmol/L   Chloride 109 98 - 111 mmol/L   CO2 19 (L) 22 - 32 mmol/L   Glucose, Bld 88 70 - 99 mg/dL   BUN 5 (L) 6 - 20 mg/dL   Creatinine, Ser 0.860.55 0.44 - 1.00 mg/dL   Calcium 8.7 (L) 8.9 - 10.3 mg/dL  Total Protein 6.7 6.5 - 8.1 g/dL   Albumin 3.3  (L) 3.5 - 5.0 g/dL   AST 36 15 - 41 U/L   ALT 36 0 - 44 U/L   Alkaline Phosphatase 48 38 - 126 U/L   Total Bilirubin 0.4 0.3 - 1.2 mg/dL   GFR calc non Af Amer >60 >60 mL/min   GFR calc Af Amer >60 >60 mL/min   Anion gap 9 5 - 15  Urinalysis, Routine w reflex microscopic     Status: Abnormal   Collection Time: 11/09/18  9:39 PM  Result Value Ref Range   Color, Urine YELLOW YELLOW   APPearance CLEAR CLEAR   Specific Gravity, Urine 1.009 1.005 - 1.030   pH 6.0 5.0 - 8.0   Glucose, UA NEGATIVE NEGATIVE mg/dL   Hgb urine dipstick NEGATIVE NEGATIVE   Bilirubin Urine NEGATIVE NEGATIVE   Ketones, ur 5 (A) NEGATIVE mg/dL   Protein, ur NEGATIVE NEGATIVE mg/dL   Nitrite NEGATIVE NEGATIVE   Leukocytes,Ua NEGATIVE NEGATIVE    MAU Course  Procedures None  MDM +UPT  Unable to provide enough urine for UA FHR - 154 bpm with doppler  IV LR with Phenergan infusion and pepcid ordered.  CBC, CMP today  Reviewed results in Care Everywhere from visit to ED in [place] 2 days ago. Normal US. Labs without gross abnormalities. Negative for vaginal infection.  2046 - Patient receiving IV fluids. Care turned over to Donia AstNicole Mordechai Matuszak, NP.  Vonzella NippleJulie Wenzel, PA-C 11/09/2018, 8:46 PM   Assessment and Plan   -urine provided for UA after first bag LR, UA WNL except 5 of ketones -CBC/CMP WNL for pregnancy, no abnormalities requiring treatment -pt feeling much better, elects to be discharged home without second bag of LR -hyperemesis/return MAU precautions given -pt discharged to home in stable condition  Oluwatobi Ruppe, Odie SeraNicole E, NP  10:27 PM 11/09/2018

## 2018-11-11 ENCOUNTER — Other Ambulatory Visit: Payer: Self-pay

## 2018-11-11 ENCOUNTER — Inpatient Hospital Stay (HOSPITAL_COMMUNITY)
Admission: AD | Admit: 2018-11-11 | Discharge: 2018-11-12 | Disposition: A | Discharge status: Home / Self Care | Payer: Medicaid Other | Attending: Obstetrics & Gynecology | Admitting: Obstetrics & Gynecology

## 2018-11-11 DIAGNOSIS — Z3A13 13 weeks gestation of pregnancy: Secondary | ICD-10-CM | POA: Insufficient documentation

## 2018-11-11 DIAGNOSIS — O219 Vomiting of pregnancy, unspecified: Secondary | ICD-10-CM | POA: Insufficient documentation

## 2018-11-11 DIAGNOSIS — Z79899 Other long term (current) drug therapy: Secondary | ICD-10-CM | POA: Insufficient documentation

## 2018-11-11 LAB — URINALYSIS, ROUTINE W REFLEX MICROSCOPIC
Bilirubin Urine: NEGATIVE
Glucose, UA: NEGATIVE mg/dL
Hgb urine dipstick: NEGATIVE
Ketones, ur: 80 mg/dL — AB
Nitrite: NEGATIVE
Protein, ur: 30 mg/dL — AB
Specific Gravity, Urine: 1.033 — ABNORMAL HIGH (ref 1.005–1.030)
pH: 5 (ref 5.0–8.0)

## 2018-11-11 MED ORDER — DOXYLAMINE-PYRIDOXINE 10-10 MG PO TBEC: 2.0000 | DELAYED_RELEASE_TABLET | Freq: Every evening | ORAL | 1 refills | Status: DC | PRN

## 2018-11-11 MED ORDER — M.V.I. ADULT IV INJ
Freq: Once | INTRAVENOUS | Status: AC
  Administered 2018-11-11: 22:00:00 via INTRAVENOUS
  Filled 2018-11-11: qty 1000

## 2018-11-11 MED ORDER — PROMETHAZINE HCL 25 MG/ML IJ SOLN
25.0000 mg | Freq: Once | INTRAVENOUS | Status: AC
  Administered 2018-11-11: 25 mg via INTRAVENOUS
  Filled 2018-11-11: qty 1

## 2018-11-11 MED ORDER — ONDANSETRON 4 MG PO TBDP
4.0000 mg | ORAL_TABLET | Freq: Once | ORAL | Status: AC
  Administered 2018-11-11: 23:00:00 4 mg via ORAL
  Filled 2018-11-11: qty 1

## 2018-11-11 MED ORDER — PROMETHAZINE HCL 25 MG PO TABS: 25.0000 mg | ORAL_TABLET | Freq: Four times a day (QID) | ORAL | 2 refills | Status: DC | PRN

## 2018-11-11 NOTE — MAU Provider Note (Addendum)
History     CSN: [id number]  Arrival date and time: 11/11/18 1626   First Provider Initiated Contact with Patient 11/11/18 1859      Chief Complaint  Patient presents with  . Emesis   HPI Sharon Garner  28 y.o.    Seen in MAU on Sunday for vomiting in pregnancy.  Got IVF and was prescribed phenergan 12.5 mg which patient is taking.  She does keep the medication down but then when she eats an hour later, she vomits again.  Is not able to keep down food or fluids.    OB History    Gravida  5   Para  2   Term  2   Preterm      AB  2   Living  2     SAB  2   TAB      Ectopic      Multiple      Live Births  2           Past Medical History:  Diagnosis Date  . Migraine     Past Surgical History:  Procedure Laterality Date  . CESAREAN SECTION N/A 10/17/2012   Procedure: CESAREAN SECTION;  Surgeon: Tereso NewcomerUgonna A Anyanwu, MD;  Location: [facility];  Service: Obstetrics;  Laterality: N/A;    Family History  Problem Relation Age of Onset  . Other Neg Hx   . Alcohol abuse Neg Hx   . Arthritis Neg Hx   . Asthma Neg Hx   . Birth defects Neg Hx   . Cancer Neg Hx   . COPD Neg Hx   . Depression Neg Hx   . Diabetes Neg Hx   . Drug abuse Neg Hx   . Early death Neg Hx   . Hearing loss Neg Hx   . Heart disease Neg Hx   . Hyperlipidemia Neg Hx   . Hypertension Neg Hx   . Kidney disease Neg Hx   . Learning disabilities Neg Hx   . Mental illness Neg Hx   . Mental retardation Neg Hx   . Miscarriages / Stillbirths Neg Hx   . Stroke Neg Hx   . Vision loss Neg Hx     Social History   Tobacco Use  . Smoking status: Never Smoker  . Smokeless tobacco: Never Used  Substance Use Topics  . Alcohol use: No  . Drug use: No    Allergies: No Known Allergies  Medications Prior to Admission  Medication Sig Dispense Refill Last Dose  . famotidine (PEPCID) 20 MG tablet Take 1 tablet (20 mg total) by mouth 2 (two) times daily. 60 tablet 1 11/11/2018 at Unknown time   . Prenatal Vit-Fe Fumarate-FA (MULTIVITAMIN-PRENATAL) 27-0.8 MG TABS tablet Take 1 tablet by mouth daily at 12 noon.   11/10/2018 at Unknown time  . promethazine (PHENERGAN) 12.5 MG tablet Take 1 tablet (12.5 mg total) by mouth every 6 (six) hours as needed for nausea or vomiting. 30 tablet 0 11/11/2018 at Unknown time  . acetaminophen (TYLENOL) 500 MG tablet Take 500 mg by mouth every 6 (six) hours as needed for mild pain, moderate pain, fever or headache.     . fluticasone (FLONASE) 50 MCG/ACT nasal spray Place 2 sprays into both nostrils daily. 16 g 1   . lidocaine (LIDODERM) 5 % Place 1 patch onto the skin daily. Remove & Discard patch within 12 hours or as directed by MD 30 patch 0     Review  of Systems  Constitutional: Negative for fever.  Respiratory: Negative for cough and shortness of breath.   Gastrointestinal: Positive for nausea and vomiting. Negative for abdominal pain.  Genitourinary: Negative for dysuria, vaginal bleeding and vaginal discharge.   Physical Exam   Blood pressure 115/64, pulse 87, temperature 98.2 F (36.8 C), temperature source Oral, resp. rate 16, weight 74.7 kg, SpO2 99 %, unknown if currently breastfeeding.  Physical Exam  Nursing note and vitals reviewed. Constitutional: She is oriented to person, place, and time. She appears well-developed and well-nourished.  HENT:  Head: Normocephalic.  Eyes: EOM are normal.  Neck: Neck supple.  GI: Soft. There is no abdominal tenderness. There is no rebound and no guarding.  Musculoskeletal: Normal range of motion.  Neurological: She is alert and oriented to person, place, and time.  Skin: Skin is warm and dry.  Psychiatric: She has a normal mood and affect.    MAU Course  Procedures  MDM Will increase her prescription of Phenergan to 25 mg PO rather than 12.5 mg and then add Diclegis also to see if it will abate her vomiting. After Phenergan in LR 1000cc, client is still feeling dizzy so will add D5LR 1000cc  with multivitamins. Care assumed by M. Jimmye Norman, CNM at 2100  Assessment and [place] 11/11/2018, 7:31 PM   Feels better after medication Will increase dose of Phenergan to 25mg  Will also add Diclegis per above note Has new OB appt with me next week Encouraged to return here or to other Urgent Care/ED if she develops worsening of symptoms, increase in pain, fever, or other concerning symptoms.    Seabron Spates, CNM

## 2018-11-11 NOTE — Discharge Instructions (Signed)
You can try Unisom and Vitamin B6 from the list below if insurance does not cover the Diclegis.  Do not take Diclegis AND Unisom and Vitamin B6 together.    Morning Sickness  Morning sickness is when you feel sick to your stomach (nauseous) during pregnancy. You may feel sick to your stomach and throw up (vomit). You may feel sick in the morning, but you can feel this way at any time of day. Some women feel very sick to their stomach and cannot stop throwing up (hyperemesis gravidarum). Follow these instructions at home: Medicines  Take over-the-counter and prescription medicines only as told by your doctor. Do not take any medicines until you talk with your doctor about them first.  Taking multivitamins before getting pregnant can stop or lessen the harshness of morning sickness. Eating and drinking  Eat dry toast or crackers before getting out of bed.  Eat 5 or 6 small meals a day.  Eat dry and bland foods like rice and baked potatoes.  Do not eat greasy, fatty, or spicy foods.  Have someone cook for you if the smell of food causes you to feel sick or throw up.  If you feel sick to your stomach after taking prenatal vitamins, take them at night or with a snack.  Eat protein when you need a snack. Nuts, yogurt, and cheese are good choices.  Drink fluids throughout the day.  Try ginger ale made with real ginger, ginger tea made from fresh grated ginger, or ginger candies. General instructions  Do not use any products that have nicotine or tobacco in them, such as cigarettes and e-cigarettes. If you need help quitting, ask your doctor.  Use an air purifier to keep the air in your house free of smells.  Get lots of fresh air.  Try to avoid smells that make you feel sick.  Try: ? Wearing a bracelet that is used for seasickness (acupressure wristband). ? Going to a doctor who puts thin needles into certain body points (acupuncture) to improve how you feel. Contact a doctor  if:  You need medicine to feel better.  You feel dizzy or light-headed.  You are losing weight. Get help right away if:  You feel very sick to your stomach and cannot stop throwing up.  You pass out (faint).  You have very bad pain in your belly. Summary  Morning sickness is when you feel sick to your stomach (nauseous) during pregnancy.  You may feel sick in the morning, but you can feel this way at any time of day.  Making some changes to what you eat may help your symptoms go away. This information is not intended to replace advice given to you by your health care provider. Make sure you discuss any questions you have with your health care provider. Document Released: 05/17/2004 Document Revised: 03/22/2017 Document Reviewed: 05/10/2016 Elsevier Patient Education  [place] Medications in Pregnancy   Acne: Benzoyl Peroxide Salicylic Acid  Backache/Headache: Tylenol: 2 regular strength every 4 hours OR              2 Extra strength every 6 hours  Colds/Coughs/Allergies: Benadryl (alcohol free) 25 mg every 6 hours as needed Breath right strips Claritin Cepacol throat lozenges Chloraseptic throat spray Cold-Eeze- up to three times per day Cough drops, alcohol free Flonase  Guaifenesin Mucinex Robitussin DM (plain only, alcohol free) Saline nasal spray/drops Sudafed (pseudoephedrine) & Actifed ** use only after [redacted] weeks gestation and  if you do not have high blood pressure Tylenol Vicks Vaporub Zinc lozenges Zyrtec   Constipation: Colace Ducolax suppositories Fleet enema Glycerin suppositories Metamucil Milk of magnesia Miralax Senokot Smooth move tea  Diarrhea: Kaopectate Imodium A-D  *NO pepto Bismol  Hemorrhoids: Anusol Anusol HC Preparation H Tucks  Indigestion: Tums Maalox Mylanta Zantac  Pepcid  Insomnia: Benadryl (alcohol free) 25mg  every 6 hours as needed Tylenol PM Unisom, no Gelcaps  Leg  Cramps: Tums MagGel  Nausea/Vomiting:  Bonine Dramamine Emetrol Ginger extract Sea bands Meclizine  Nausea medication to take during pregnancy:  Unisom (doxylamine succinate 25 mg tablets) Take one tablet daily at bedtime. If symptoms are not adequately controlled, the dose can be increased to a maximum recommended dose of two tablets daily (1/2 tablet in the morning, 1/2 tablet mid-afternoon and one at bedtime). Vitamin B6 100mg  tablets. Take one tablet twice a day (up to 200 mg per day).  Skin Rashes: Aveeno products Benadryl cream or 25mg  every 6 hours as needed Calamine Lotion 1% cortisone cream  Yeast infection: Gyne-lotrimin 7 Monistat 7   **If taking multiple medications, please check labels to avoid duplicating the same active ingredients **take medication as directed on the label ** Do not exceed 4000 mg of tylenol in 24 hours **Do not take medications that contain aspirin or ibuprofen

## 2018-11-11 NOTE — MAU Note (Signed)
Pt c/o of 2 weeks of severe nausea and vomiting. Pt states she gets very dizzy now with movement and feels dehydrated.

## 2018-11-11 NOTE — MAU Note (Signed)
Was here on Sunday.  Again today, throwing up, can't keep anything down.  Is throwing up blood.  rx for phenergan, took this morning. Feels weak, dizzy.

## 2018-11-18 ENCOUNTER — Ambulatory Visit (INDEPENDENT_AMBULATORY_CARE_PROVIDER_SITE_OTHER): Payer: Medicaid Other | Admitting: Advanced Practice Midwife

## 2018-11-18 ENCOUNTER — Other Ambulatory Visit (HOSPITAL_COMMUNITY)
Admission: RE | Admit: 2018-11-18 | Discharge: 2018-11-18 | Disposition: A | Discharge status: Home / Self Care | Payer: Medicaid Other | Source: Ambulatory Visit | Attending: Advanced Practice Midwife | Admitting: Advanced Practice Midwife

## 2018-11-18 ENCOUNTER — Other Ambulatory Visit: Payer: Self-pay

## 2018-11-18 ENCOUNTER — Encounter: Payer: Self-pay | Admitting: Advanced Practice Midwife

## 2018-11-18 VITALS — BP 117/65 | HR 89 | Wt 160.1 lb

## 2018-11-18 DIAGNOSIS — Z98891 History of uterine scar from previous surgery: Secondary | ICD-10-CM | POA: Insufficient documentation

## 2018-11-18 DIAGNOSIS — O210 Mild hyperemesis gravidarum: Secondary | ICD-10-CM

## 2018-11-18 DIAGNOSIS — Z3482 Encounter for supervision of other normal pregnancy, second trimester: Secondary | ICD-10-CM

## 2018-11-18 DIAGNOSIS — Z362 Encounter for other antenatal screening follow-up: Secondary | ICD-10-CM

## 2018-11-18 DIAGNOSIS — Z3480 Encounter for supervision of other normal pregnancy, unspecified trimester: Secondary | ICD-10-CM | POA: Insufficient documentation

## 2018-11-18 DIAGNOSIS — Z3A14 14 weeks gestation of pregnancy: Secondary | ICD-10-CM

## 2018-11-18 MED ORDER — POLYETHYLENE GLYCOL 3350 17 G PO PACK: 17.0000 g | PACK | Freq: Every day | ORAL | 3 refills | Status: DC

## 2018-11-18 MED ORDER — ONDANSETRON HCL 8 MG PO TABS: 8.0000 mg | ORAL_TABLET | Freq: Three times a day (TID) | ORAL | 1 refills | Status: DC | PRN

## 2018-11-18 MED ORDER — AMBULATORY NON FORMULARY MEDICATION: 1.0000 | 0 refills | Status: DC

## 2018-11-18 NOTE — Patient Instructions (Signed)
Hyperemesis Gravidarum °Hyperemesis gravidarum is a severe form of nausea and vomiting that happens during pregnancy. Hyperemesis is worse than morning sickness. It may cause you to have nausea or vomiting all day for many days. It may keep you from eating and drinking enough food and liquids, which can lead to dehydration, malnutrition, and weight loss. Hyperemesis usually occurs during the first half (the first 20 weeks) of pregnancy. It often goes away once a woman is in her second half of pregnancy. However, sometimes hyperemesis continues through an entire pregnancy. °What are the causes? °The cause of this condition is not known. It may be related to changes in chemicals (hormones) in the body during pregnancy, such as the high level of pregnancy hormone (human chorionic gonadotropin) or the increase in the female sex hormone (estrogen). °What are the signs or symptoms? °Symptoms of this condition include: °· Nausea that does not go away. °· Vomiting that does not allow you to keep any food down. °· Weight loss. °· Body fluid loss (dehydration). °· Having no desire to eat, or not liking food that you have previously enjoyed. °How is this diagnosed? °This condition may be diagnosed based on: °· A physical exam. °· Your medical history. °· Your symptoms. °· Blood tests. °· Urine tests. °How is this treated? °This condition is managed by controlling symptoms. This may include: °· Following an eating plan. This can help lessen nausea and vomiting. °· Taking prescription medicines. °An eating plan and medicines are often used together to help control symptoms. If medicines do not help relieve nausea and vomiting, you may need to receive fluids through an IV at the hospital. °Follow these instructions at home: °Eating and drinking ° °· Avoid the following: °? Drinking fluids with meals. Try not to drink anything during the 30 minutes before and after your meals. °? Drinking more than 1 cup of fluid at a  time. °? Eating foods that trigger your symptoms. These may include spicy foods, coffee, high-fat foods, very sweet foods, and acidic foods. °? Skipping meals. Nausea can be more intense on an empty stomach. If you cannot tolerate food, do not force it. Try sucking on ice chips or other frozen items and make up for missed calories later. °? Lying down within 2 hours after eating. °? Being exposed to environmental triggers. These may include food smells, smoky rooms, closed spaces, rooms with strong smells, warm or humid places, overly loud and noisy rooms, and rooms with motion or flickering lights. Try eating meals in a well-ventilated area that is free of strong smells. °? Quick and sudden changes in your movement. °? Taking iron pills and multivitamins that contain iron. If you take prescription iron pills, do not stop taking them unless your health care provider approves. °? Preparing food. The smell of food can spoil your appetite or trigger nausea. °· To help relieve your symptoms: °? Listen to your body. Everyone is different and has different preferences. Find what works best for you. °? Eat and drink slowly. °? Eat 5-6 small meals daily instead of 3 large meals. Eating small meals and snacks can help you avoid an empty stomach. °? In the morning, before getting out of bed, eat a couple of crackers to avoid moving around on an empty stomach. °? Try eating starchy foods as these are usually tolerated well. Examples include cereal, toast, bread, potatoes, pasta, rice, and pretzels. °? Include at least 1 serving of protein with your meals and snacks. Protein options include   lean meats, poultry, seafood, beans, nuts, nut butters, eggs, cheese, and yogurt. °? Try eating a protein-rich snack before bed. Examples of a protein-rick snack include cheese and crackers or a peanut butter sandwich made with 1 slice of whole-wheat bread and 1 tsp (5 g) of peanut butter. °? Eat or suck on things that have ginger in them.  It may help relieve nausea. Add ¼ tsp ground ginger to hot tea or choose ginger tea. °? Try drinking 100% fruit juice or an electrolyte drink. An electrolyte drink contains sodium, potassium, and chloride. °? Drink fluids that are cold, clear, and carbonated or sour. Examples include lemonade, ginger ale, lemon-lime soda, ice water, and sparkling water. °? Brush your teeth or use a mouth rinse after meals. °? Talk with your health care provider about starting a supplement of vitamin B6. °General instructions °· Take over-the-counter and prescription medicines only as told by your health care provider. °· Follow instructions from your health care provider about eating or drinking restrictions. °· Continue to take your prenatal vitamins as told by your health care provider. If you are having trouble taking your prenatal vitamins, talk with your health care provider about different options. °· Keep all follow-up and pre-birth (prenatal) visits as told by your health care provider. This is important. °Contact a health care provider if: °· You have pain in your abdomen. °· You have a severe headache. °· You have vision problems. °· You are losing weight. °· You feel weak or dizzy. °Get help right away if: °· You cannot drink fluids without vomiting. °· You vomit blood. °· You have constant nausea and vomiting. °· You are very weak. °· You faint. °· You have a fever and your symptoms suddenly get worse. °Summary °· Hyperemesis gravidarum is a severe form of nausea and vomiting that happens during pregnancy. °· Making some changes to your eating habits may help relieve nausea and vomiting. °· This condition may be managed with medicine. °· If medicines do not help relieve nausea and vomiting, you may need to receive fluids through an IV at the hospital. °This information is not intended to replace advice given to you by your health care provider. Make sure you discuss any questions you have with your health care  provider. °Document Released: 04/09/2005 Document Revised: 04/29/2017 Document Reviewed: 12/07/2015 °Elsevier Patient Education © 2020 Elsevier Inc. ° ° °

## 2018-11-18 NOTE — Progress Notes (Signed)
  Subjective:    Sharon Garner is being seen today for her first obstetrical visit.  This is not a planned pregnancy. She is at [redacted]w[redacted]d gestation. Her obstetrical history is significant for Prior C/S (2nd baby, for breech) hyperemesis. Wants Trial of Labor.  Relationship with FOB: spouse, living together. Patient does intend to breast feed. Pregnancy history fully reviewed.  Patient reports nausea, vomiting and hyperemesis with constipation.  Review of Systems:   Review of Systems  Constitutional: Positive for fatigue and unexpected weight change (loss). Negative for chills and fever.  Respiratory: Negative for shortness of breath.   Gastrointestinal: Positive for constipation, nausea and vomiting. Negative for abdominal pain and diarrhea.  Genitourinary: Negative for pelvic pain and vaginal bleeding.  Neurological: Positive for dizziness. Negative for headaches.    Objective:     BP 117/65   Pulse 89   Wt 72.6 kg   BMI 29.28 kg/m  Physical Exam  Constitutional: She is oriented to person, place, and time. She appears well-developed and well-nourished. No distress.  HENT:  Head: Normocephalic.  Cardiovascular: Normal rate and regular rhythm.  Respiratory: Effort normal. No respiratory distress. She has no wheezes. She has no rales.  GI: Soft. She exhibits no distension. There is no abdominal tenderness. There is no rebound and no guarding.  Genitourinary:    Vulva normal.     Genitourinary Comments: EGBUS normal Cervix closed/long Uterus s=d   Musculoskeletal: Normal range of motion.        General: No edema.  Neurological: She is alert and oriented to person, place, and time.  Skin: Skin is warm and dry.  Psychiatric: She has a normal mood and affect.    Maternal Exam:  Abdomen: Patient reports no abdominal tenderness. Surgical scars: low transverse.   Introitus: Normal vulva. Normal vagina.    Fetal Exam Fetal Monitor Review: Baseline rate: 150s.     US done  by RN in office, normal FHR and size    Assessment:    Pregnancy: [id number]    Prior cesarean Delivery, requests TOLAC   Hyperemesis  Patient Active Problem List   Diagnosis Date Noted  . Supervision of other normal pregnancy, antepartum 11/18/2018  . Hx of cesarean section 11/18/2018  . Missed abortion 05/13/2018  . Visit for wound check 11/26/2012  . Family history of mental retardation 05/16/2012  . Migraine headache without aura 11/09/2011       Plan:    Welcomed to practice Routines reviewed including various provider types and students  Still having intractable nausea and vomiting.  Dehydrated.  Voids little and constipated.  Eats only once a day. Has tried multiple meds except Zofran.   Discussed adding Zofran 4-8mg  q 8 h.   May need IV hydration this week due to dizziness  Initial labs drawn. Prenatal vitamins. Problem list reviewed and updated. Panorama and AFP discussed: requested. Role of ultrasound in pregnancy discussed; fetal survey: requested. Amniocentesis discussed: not indicated. Follow up in 4 weeks. 50% of 30 min visit spent on counseling and coordination of care.   Rx Miralax for prn use for constipation RTO 4 weeks   Hansel Feinstein 11/18/2018

## 2018-11-18 NOTE — Progress Notes (Signed)
DATING AND VIABILITY SONOGRAM   Sharon Garner is a 28 y.o. year old G5P2022 with LMP No LMP recorded. Patient is pregnant. which would correlate to  [redacted]w[redacted]d weeks gestation.  She has regular, irregular menstrual cycles.   She is here today for a confirmatory initial sonogram.    GESTATION: [place]     FETAL ACTIVITY:          Heart rate         134          The fetus is active.     GESTATIONAL AGE AND  BIOMETRICS:  Gestational criteria: Estimated Date of Delivery: 05/18/19 by LMP now at [redacted]w[redacted]d  Previous Scans:0  Head circ 9.38 cm         14-1 weeks   10.3 cm         14-5 weeks                                                                               AVERAGE EGA(BY THIS SCAN):  14-3 weeks  WORKING EDD( best clinical estimate ):  05-18-2019     TECHNICIAN COMMENTS: Patient informed that the ultrasound is considered a limited obstetric ultrasound and is not intended to be a complete ultrasound exam. Patient also informed that the ultrasound is not being completed with the intent of assessing for fetal or placental anomalies or any pelvic abnormalities. Explained that the purpose of today's ultrasound is to assess for fetal heart rate. Patient acknowledges the purpose of the exam and the limitations of the study.    Kathrene Alu 11/18/2018 10:40 AM

## 2018-11-18 NOTE — Addendum Note (Signed)
Addended by: Wendelyn Breslow L on: 11/18/2018 01:07 PM   Modules accepted: Orders

## 2018-11-19 LAB — GC/CHLAMYDIA PROBE AMP (~~LOC~~) NOT AT ARMC
Chlamydia: NEGATIVE
Neisseria Gonorrhea: NEGATIVE

## 2018-11-20 ENCOUNTER — Encounter: Payer: Self-pay | Admitting: Advanced Practice Midwife

## 2018-11-20 LAB — HEMOGLOBINOPATHY EVALUATION
Ferritin: 135 ng/mL (ref 15–150)
Hgb A2 Quant: 2.3 % (ref 1.8–3.2)
Hgb A: 97.7 % (ref 96.4–98.8)
Hgb C: 0 %
Hgb F Quant: 0 % (ref 0.0–2.0)
Hgb S: 0 %
Hgb Solubility: NEGATIVE
Hgb Variant: 0 %

## 2018-11-20 LAB — OBSTETRIC PANEL, INCLUDING HIV
Antibody Screen: NEGATIVE
Basophils Absolute: 0 10*3/uL (ref 0.0–0.2)
Basos: 0 %
EOS (ABSOLUTE): 0.1 10*3/uL (ref 0.0–0.4)
Eos: 1 %
HIV Screen 4th Generation wRfx: NONREACTIVE
Hematocrit: 40.5 % (ref 34.0–46.6)
Hemoglobin: 13.7 g/dL (ref 11.1–15.9)
Hepatitis B Surface Ag: NEGATIVE
Immature Grans (Abs): 0 10*3/uL (ref 0.0–0.1)
Immature Granulocytes: 0 %
Lymphocytes Absolute: 1.6 10*3/uL (ref 0.7–3.1)
Lymphs: 15 %
MCH: 30.8 pg (ref 26.6–33.0)
MCHC: 33.8 g/dL (ref 31.5–35.7)
MCV: 91 fL (ref 79–97)
Monocytes Absolute: 0.6 10*3/uL (ref 0.1–0.9)
Monocytes: 5 %
Neutrophils Absolute: 8.2 10*3/uL — ABNORMAL HIGH (ref 1.4–7.0)
Neutrophils: 79 %
Platelets: 347 10*3/uL (ref 150–450)
RBC: 4.45 x10E6/uL (ref 3.77–5.28)
RDW: 13.1 % (ref 11.7–15.4)
RPR Ser Ql: NONREACTIVE
Rh Factor: POSITIVE
Rubella Antibodies, IGG: 1.67 index (ref >0.99)
WBC: 10.6 10*3/uL (ref 3.4–10.8)

## 2018-11-20 LAB — CULTURE, OB URINE

## 2018-11-20 LAB — URINE CULTURE, OB REFLEX

## 2018-11-28 ENCOUNTER — Encounter: Payer: Self-pay | Admitting: Advanced Practice Midwife

## 2018-12-01 ENCOUNTER — Encounter: Payer: Self-pay | Admitting: Advanced Practice Midwife

## 2018-12-01 ENCOUNTER — Telehealth: Payer: Self-pay

## 2018-12-01 NOTE — Telephone Encounter (Signed)
Pt called the office wanting to know if it is safe to take Vitamin B6 100mg . Pt also requested Panorama results. Pt made aware that B6 is safe in pregnancy and Panorama results are normal. Pt also made aware that she is having a girl.Understanding was voiced.  Sharon Garner, CMA

## 2018-12-02 ENCOUNTER — Other Ambulatory Visit: Payer: Self-pay | Admitting: Advanced Practice Midwife

## 2018-12-02 MED ORDER — PANTOPRAZOLE SODIUM 20 MG PO TBEC: 20.0000 mg | DELAYED_RELEASE_TABLET | Freq: Every day | ORAL | 0 refills | Status: DC

## 2018-12-02 NOTE — Progress Notes (Signed)
Rx sent for protonix for heartburn

## 2018-12-05 ENCOUNTER — Encounter: Payer: Self-pay | Admitting: Advanced Practice Midwife

## 2018-12-05 ENCOUNTER — Other Ambulatory Visit: Payer: Self-pay | Admitting: Advanced Practice Midwife

## 2018-12-09 ENCOUNTER — Encounter: Payer: Medicaid Other | Admitting: Advanced Practice Midwife

## 2018-12-09 MED ORDER — PANTOPRAZOLE SODIUM 20 MG PO TBEC: 20.0000 mg | DELAYED_RELEASE_TABLET | Freq: Every day | ORAL | 0 refills | Status: DC

## 2018-12-19 ENCOUNTER — Ambulatory Visit (INDEPENDENT_AMBULATORY_CARE_PROVIDER_SITE_OTHER): Payer: Medicaid Other | Admitting: Family Medicine

## 2018-12-19 ENCOUNTER — Other Ambulatory Visit: Payer: Self-pay

## 2018-12-19 VITALS — BP 114/69 | HR 94 | Wt 164.0 lb

## 2018-12-19 DIAGNOSIS — R42 Dizziness and giddiness: Secondary | ICD-10-CM

## 2018-12-19 DIAGNOSIS — Z23 Encounter for immunization: Secondary | ICD-10-CM | POA: Diagnosis not present

## 2018-12-19 DIAGNOSIS — Z3A18 18 weeks gestation of pregnancy: Secondary | ICD-10-CM

## 2018-12-19 DIAGNOSIS — Z3480 Encounter for supervision of other normal pregnancy, unspecified trimester: Secondary | ICD-10-CM

## 2018-12-19 DIAGNOSIS — Z3482 Encounter for supervision of other normal pregnancy, second trimester: Secondary | ICD-10-CM

## 2018-12-19 NOTE — Progress Notes (Signed)
Patient states she has had increase in migraines. Kathrene Alu RN

## 2018-12-19 NOTE — Progress Notes (Signed)
   PRENATAL VISIT NOTE  Subjective:  Sharon Garner is a 28 y.o. [phone] at [redacted]w[redacted]d being seen today for ongoing prenatal care.  She is currently monitored for the following issues for this low-risk pregnancy and has Family history of mental retardation; Supervision of other normal pregnancy, antepartum; Hx of cesarean section; and Migraine headache without aura on their problem list.  Patient reports episodes of dizziness when standing or walking around. eating well, drinks 4-5 bottles of water. .  Contractions: Not present. Vag. Bleeding: None.  Movement: Present. Denies leaking of fluid.   The following portions of the patient's history were reviewed and updated as appropriate: allergies, current medications, past family history, past medical history, past social history, past surgical history and problem list.   Objective:   Vitals:   12/19/18 0854  BP: 114/69  Pulse: 94  Weight: 164 lb (74.4 kg)    Fetal Status: Fetal Heart Rate (bpm): 135 Fundal Height: 19 cm Movement: Present     General:  Alert, oriented and cooperative. Patient is in no acute distress.  Skin: Skin is warm and dry. No rash noted.   Cardiovascular: Normal heart rate noted  Respiratory: Normal respiratory effort, no problems with respiration noted  Abdomen: Soft, gravid, appropriate for gestational age.  Pain/Pressure: Present     Pelvic: Cervical exam deferred        Extremities: Normal range of motion.  Edema: None  Mental Status: Normal mood and affect. Normal behavior. Normal judgment and thought content.   Assessment and Plan:  Pregnancy: [id number] at [redacted]w[redacted]d 1. Supervision of other normal pregnancy, antepartum FHT and FH normal  2. Dizziness - CBC  Preterm labor symptoms and general obstetric precautions including but not limited to vaginal bleeding, contractions, leaking of fluid and fetal movement were reviewed in detail with the patient. Please refer to After Visit Summary for other counseling  recommendations.   Return in about 8 weeks (around 02/13/2019) for OB f/u, 2 hr GTT, In Office.  Future Appointments  Date Time Provider [place]  12/25/2018  9:00 AM [place] [facility]  12/25/2018  9:00 AM [facility] [place] 3 [facility]  02/13/2019  8:15 AM Truett Mainland, DO [facility] None    Truett Mainland, DO

## 2018-12-20 LAB — CBC
Hematocrit: 35.7 % (ref 34.0–46.6)
Hemoglobin: 12.2 g/dL (ref 11.1–15.9)
MCH: 30.7 pg (ref 26.6–33.0)
MCHC: 34.2 g/dL (ref 31.5–35.7)
MCV: 90 fL (ref 79–97)
Platelets: 369 10*3/uL (ref 150–450)
RBC: 3.98 x10E6/uL (ref 3.77–5.28)
RDW: 12.7 % (ref 11.7–15.4)
WBC: 11.8 10*3/uL — ABNORMAL HIGH (ref 3.4–10.8)

## 2018-12-24 ENCOUNTER — Telehealth: Payer: Self-pay

## 2018-12-24 NOTE — Telephone Encounter (Signed)
Patient called stating she is [redacted] weeks pregnant. Patient states she was cleaning yesterday and when she got done she laid down because she was feeling some pain in her hips. Patient states then she had a gush feeling. Patient states she did have a large amount of white/ clear fluid in her underwear. Patient states she has had cramping since this occurrence and she feels the baby has dropped a lot lower as she is feeling movement in her lower abdomen and vagina.  Patient instructed to go now [facility] in [place] for evaluation. Made her aware that there is not a doctor in office currently and she needs to be evaluated now. Patient states understanding and agreeable with plan Kathrene Alu RN

## 2018-12-25 ENCOUNTER — Ambulatory Visit (HOSPITAL_COMMUNITY)
Admission: RE | Admit: 2018-12-25 | Discharge: 2018-12-25 | Disposition: A | Discharge status: Home / Self Care | Payer: Medicaid Other | Source: Ambulatory Visit | Attending: Obstetrics and Gynecology | Admitting: Obstetrics and Gynecology

## 2018-12-25 ENCOUNTER — Encounter (HOSPITAL_COMMUNITY): Payer: Self-pay

## 2018-12-25 ENCOUNTER — Other Ambulatory Visit: Payer: Self-pay

## 2018-12-25 ENCOUNTER — Ambulatory Visit (HOSPITAL_COMMUNITY): Payer: Medicaid Other | Admitting: *Deleted

## 2018-12-25 ENCOUNTER — Other Ambulatory Visit (HOSPITAL_COMMUNITY): Payer: Self-pay | Admitting: *Deleted

## 2018-12-25 DIAGNOSIS — Z363 Encounter for antenatal screening for malformations: Secondary | ICD-10-CM

## 2018-12-25 DIAGNOSIS — Z98891 History of uterine scar from previous surgery: Secondary | ICD-10-CM | POA: Insufficient documentation

## 2018-12-25 DIAGNOSIS — Z362 Encounter for other antenatal screening follow-up: Secondary | ICD-10-CM

## 2018-12-25 DIAGNOSIS — Z3480 Encounter for supervision of other normal pregnancy, unspecified trimester: Secondary | ICD-10-CM | POA: Diagnosis not present

## 2018-12-25 DIAGNOSIS — Z3A19 19 weeks gestation of pregnancy: Secondary | ICD-10-CM | POA: Diagnosis not present

## 2018-12-25 DIAGNOSIS — O210 Mild hyperemesis gravidarum: Secondary | ICD-10-CM | POA: Diagnosis not present

## 2018-12-25 DIAGNOSIS — O34219 Maternal care for unspecified type scar from previous cesarean delivery: Secondary | ICD-10-CM

## 2018-12-27 DIAGNOSIS — Z3490 Encounter for supervision of normal pregnancy, unspecified, unspecified trimester: Secondary | ICD-10-CM | POA: Diagnosis not present

## 2019-01-08 ENCOUNTER — Other Ambulatory Visit (HOSPITAL_COMMUNITY)
Admission: RE | Admit: 2019-01-08 | Discharge: 2019-01-08 | Disposition: A | Discharge status: Home / Self Care | Payer: Medicaid Other | Source: Ambulatory Visit | Attending: Family Medicine | Admitting: Family Medicine

## 2019-01-08 ENCOUNTER — Telehealth: Payer: Self-pay

## 2019-01-08 ENCOUNTER — Other Ambulatory Visit: Payer: Self-pay

## 2019-01-08 ENCOUNTER — Other Ambulatory Visit: Payer: Self-pay | Admitting: Family Medicine

## 2019-01-08 ENCOUNTER — Ambulatory Visit (INDEPENDENT_AMBULATORY_CARE_PROVIDER_SITE_OTHER): Payer: Medicaid Other | Admitting: Family Medicine

## 2019-01-08 VITALS — BP 108/69 | HR 88 | Wt 165.0 lb

## 2019-01-08 DIAGNOSIS — Z3480 Encounter for supervision of other normal pregnancy, unspecified trimester: Secondary | ICD-10-CM

## 2019-01-08 DIAGNOSIS — O26892 Other specified pregnancy related conditions, second trimester: Secondary | ICD-10-CM

## 2019-01-08 DIAGNOSIS — O26899 Other specified pregnancy related conditions, unspecified trimester: Secondary | ICD-10-CM

## 2019-01-08 DIAGNOSIS — N898 Other specified noninflammatory disorders of vagina: Secondary | ICD-10-CM | POA: Insufficient documentation

## 2019-01-08 DIAGNOSIS — Z3A21 21 weeks gestation of pregnancy: Secondary | ICD-10-CM

## 2019-01-08 NOTE — Progress Notes (Signed)
Patient complaining of pain and cramping. States she has a lot of clear discharge. Kathrene Alu RN

## 2019-01-08 NOTE — Progress Notes (Signed)
   PRENATAL VISIT NOTE  Subjective:  Sharon Garner is a 28 y.o. [phone] at [redacted]w[redacted]d being seen today for ongoing prenatal care.  She is currently monitored for the following issues for this low-risk pregnancy and has Family history of mental retardation; Supervision of other normal pregnancy, antepartum; Hx of cesarean section; and Migraine headache without aura on their problem list.  Patient reports lower abdominal cramping, on sides of uterus with pain in legs and hips. Has been having a lot of clear-white discharge..  Contractions: Not present. Vag. Bleeding: None.  Movement: Present. Denies leaking of fluid.   The following portions of the patient's history were reviewed and updated as appropriate: allergies, current medications, past family history, past medical history, past social history, past surgical history and problem list.   Objective:   Vitals:   01/08/19 1316  BP: 108/69  Pulse: 88  Weight: 165 lb (74.8 kg)    Fetal Status: Fetal Heart Rate (bpm): 140 Fundal Height: 20 cm Movement: Present     General:  Alert, oriented and cooperative. Patient is in no acute distress.  Skin: Skin is warm and dry. No rash noted.   Cardiovascular: Normal heart rate noted  Respiratory: Normal respiratory effort, no problems with respiration noted  Abdomen: Soft, gravid, appropriate for gestational age.  Pain/Pressure: Present . Round ligament tenderness.  Pelvic: Cervical exam performed Dilation: Closed Effacement (%): Thick Station: Ballotable. No pooling. Nitrizine neg. Neg fern.   Extremities: Normal range of motion.  Edema: None  Mental Status: Normal mood and affect. Normal behavior. Normal judgment and thought content.   Assessment and Plan:  Pregnancy: [id number] at [redacted]w[redacted]d 1. Supervision of other normal pregnancy, antepartum FHT and FH normal  2. Vaginal discharge during pregnancy, antepartum Check for infection. No evidence of PPROM or preterm labor.  - Cervicovaginal  ancillary only( [place])  Preterm labor symptoms and general obstetric precautions including but not limited to vaginal bleeding, contractions, leaking of fluid and fetal movement were reviewed in detail with the patient. Please refer to After Visit Summary for other counseling recommendations.   Return in about 2 weeks (around 01/22/2019) for OB f/u, In Office.  Future Appointments  Date Time Provider [place]  01/22/2019  8:40 AM [place] [facility]  01/22/2019  8:45 AM [place] 5 [facility]  01/27/2019  8:45 AM [place] Spates, CNM [facility] None    Truett Mainland, DO

## 2019-01-08 NOTE — Telephone Encounter (Signed)
Patient called and is [redacted] weeks pregnant. Patient states she is having some vaginal pain and pain in her lower back and legs. Patient states baby is moving good. Patient denies any bleeding or leaking of fluid. Patient instructed to come in today at 115pm for and OB appt. Kathrene Alu RN

## 2019-01-09 LAB — CERVICOVAGINAL ANCILLARY ONLY
Bacterial Vaginitis (gardnerella): NEGATIVE
Candida Glabrata: NEGATIVE
Candida Vaginitis: NEGATIVE
Molecular Disclaimer: NEGATIVE
Molecular Disclaimer: NEGATIVE
Molecular Disclaimer: NEGATIVE
Molecular Disclaimer: NORMAL
Trichomonas: NEGATIVE

## 2019-01-10 LAB — CERVICOVAGINAL ANCILLARY ONLY
Chlamydia: NEGATIVE
Neisseria Gonorrhea: NEGATIVE

## 2019-01-22 ENCOUNTER — Encounter (HOSPITAL_COMMUNITY): Payer: Self-pay

## 2019-01-22 ENCOUNTER — Encounter: Payer: Medicaid Other | Admitting: Family Medicine

## 2019-01-22 ENCOUNTER — Ambulatory Visit (HOSPITAL_COMMUNITY): Payer: Medicaid Other | Admitting: *Deleted

## 2019-01-22 ENCOUNTER — Other Ambulatory Visit: Payer: Self-pay

## 2019-01-22 ENCOUNTER — Ambulatory Visit (HOSPITAL_COMMUNITY)
Admission: RE | Admit: 2019-01-22 | Discharge: 2019-01-22 | Disposition: A | Discharge status: Home / Self Care | Payer: Medicaid Other | Source: Ambulatory Visit | Attending: Obstetrics and Gynecology | Admitting: Obstetrics and Gynecology

## 2019-01-22 DIAGNOSIS — Z3480 Encounter for supervision of other normal pregnancy, unspecified trimester: Secondary | ICD-10-CM

## 2019-01-22 DIAGNOSIS — Z3A23 23 weeks gestation of pregnancy: Secondary | ICD-10-CM | POA: Diagnosis not present

## 2019-01-22 DIAGNOSIS — Z98891 History of uterine scar from previous surgery: Secondary | ICD-10-CM | POA: Diagnosis not present

## 2019-01-22 DIAGNOSIS — Z362 Encounter for other antenatal screening follow-up: Secondary | ICD-10-CM

## 2019-01-22 DIAGNOSIS — O34219 Maternal care for unspecified type scar from previous cesarean delivery: Secondary | ICD-10-CM | POA: Diagnosis not present

## 2019-01-27 ENCOUNTER — Other Ambulatory Visit: Payer: Self-pay

## 2019-01-27 ENCOUNTER — Encounter: Payer: Self-pay | Admitting: Advanced Practice Midwife

## 2019-01-27 ENCOUNTER — Ambulatory Visit (INDEPENDENT_AMBULATORY_CARE_PROVIDER_SITE_OTHER): Payer: Medicaid Other | Admitting: Advanced Practice Midwife

## 2019-01-27 VITALS — BP 109/71 | HR 88 | Wt 166.0 lb

## 2019-01-27 DIAGNOSIS — Z23 Encounter for immunization: Secondary | ICD-10-CM | POA: Diagnosis not present

## 2019-01-27 DIAGNOSIS — Z3480 Encounter for supervision of other normal pregnancy, unspecified trimester: Secondary | ICD-10-CM

## 2019-01-27 DIAGNOSIS — O26899 Other specified pregnancy related conditions, unspecified trimester: Secondary | ICD-10-CM | POA: Insufficient documentation

## 2019-01-27 DIAGNOSIS — R109 Unspecified abdominal pain: Secondary | ICD-10-CM

## 2019-01-27 DIAGNOSIS — Z98891 History of uterine scar from previous surgery: Secondary | ICD-10-CM

## 2019-01-27 DIAGNOSIS — Z3A24 24 weeks gestation of pregnancy: Secondary | ICD-10-CM

## 2019-01-27 DIAGNOSIS — O34219 Maternal care for unspecified type scar from previous cesarean delivery: Secondary | ICD-10-CM

## 2019-01-27 DIAGNOSIS — O26892 Other specified pregnancy related conditions, second trimester: Secondary | ICD-10-CM

## 2019-01-27 NOTE — Progress Notes (Signed)
Patient is doing her glucose tolerance test today. Kathrene Alu RN

## 2019-01-27 NOTE — Progress Notes (Signed)
   PRENATAL VISIT NOTE  Subjective:  Sharon Garner is a 28 y.o. [phone] at [redacted]w[redacted]d being seen today for ongoing prenatal care.  She is currently monitored for the following issues for this high-risk pregnancy and has Family history of mental retardation; Supervision of other normal pregnancy, antepartum; Hx of cesarean section; Migraine headache without aura; and Cramping affecting pregnancy, antepartum on their problem list.  Patient reports cramping over past 2-3 days.  Contractions: Not present. Vag. Bleeding: None.  Movement: Present. Denies leaking of fluid.   The following portions of the patient's history were reviewed and updated as appropriate: allergies, current medications, past family history, past medical history, past social history, past surgical history and problem list.   Objective:   Vitals:   01/27/19 0838  BP: 109/71  Pulse: 88  Weight: 75.3 kg    Fetal Status: Fetal Heart Rate (bpm): 140 Fundal Height: 25 cm Movement: Present  Presentation: Undeterminable  General:  Alert, oriented and cooperative. Patient is in no acute distress.  Skin: Skin is warm and dry. No rash noted.   Cardiovascular: Normal heart rate noted  Respiratory: Normal respiratory effort, no problems with respiration noted  Abdomen: Soft, gravid, appropriate for gestational age.  Pain/Pressure: Absent     Pelvic: Cervical exam performed Dilation: Closed Effacement (%): Thick Station: Ballotable  Extremities: Normal range of motion.  Edema: None  Mental Status: Normal mood and affect. Normal behavior. Normal judgment and thought content.   Assessment and Plan:  Pregnancy: [id number] at [redacted]w[redacted]d 1. Supervision of other normal pregnancy, antepartum  - Glucose Tolerance, 2 Hours w/1 Hour - CBC - RPR - HIV antibody (with reflex) - Fetal fibronectin  2. Cramping affecting pregnancy, antepartum      Cervix long and closed but soft - Fetal fibronectin sent  3. Hx of cesarean section     VBAC  consent signed today  Preterm labor symptoms and general obstetric precautions including but not limited to vaginal bleeding, contractions, leaking of fluid and fetal movement were reviewed in detail with the patient. Please refer to After Visit Summary for other counseling recommendations.   RTO 4 weeks  Hansel Feinstein, CNM

## 2019-01-27 NOTE — Patient Instructions (Signed)
Segundo trimestre da gestao Second Trimester of Pregnancy O segundo trimestre da gestao vai da semana 14 at a semana 27 (do 4 ao 6 ms). O segundo trimestre com frequncia  o perodo no qual voc se sente melhor. Seu corpo j se acostumou  Carrie Mew, e voc comea a se sentir melhor fisicamente. Geralmente, o enjoo matinal diminui ou cessa completamente e voc pode sentir um aumento da energia e do apetite. O segundo trimestre tambm  um perodo no qual o feto se desenvolve rapidamente. No final do sexto ms, o feto tem por volta de 9 polegadas de comprimento e pesa por volta de 1 libra. Voc provavelmente comear a sentir o beb se mexer (percepo dos primeiros movimentos) entre a semana 16 e a semana 21 da gestao. Alteraes corporais durante o segundo trimestre Seu corpo continua a passar por muitas mudanas durante o segundo trimestre. As mudanas variam de mulher para [employer].  Seu peso continuar a Garment/textile technologist. Voc notar que a parte inferior de seu abdome ficar saliente.  Voc pode comear a ter estrias nos quadris, abdome e seios.  Voc poder sentir dores de cabea que podem ser [place] por medicamentos. Os medicamentos devero ser aprovados pelo seu mdico.  Voc pode urinar com mais frequncia, pois o feto est pressionando sua bexiga.  Voc pode manifestar ou continuar a sentir Engineering geologist de Ozzie Hoyle.  Voc pode ficar com priso de ventre, pois certos hormnios esto fazendo com que os msculos que empurram as fezes em seu intestino fiquem mais lentos.  Voc poder apresentar hemorroidas ou veias inchadas e salientes (veias varicosas).  Voc poder sentir dor nas costas. Isso  causado por: ? [employer]. ? Hormnios da gravidez, que esto relaxando suas articulaes e sua pelve. ? Uma alterao no peso e nos msculos que suportam seu equilbrio.  Seus seios continuaro a crescer e ficaro ainda mais sensveis.  Suas gengivas podero sangrar e ficar  sensveis  escovao e ao uso do fio dental.  Marcas escuras (cloasmas, mscaras de gravidez) podero surgir no seu rosto. Ela tende a desaparecer depois do nascimento do beb.  Pode surgir uma linha escura do seu umbigo at a regio pubiana (linea nigra). Ela tende a desaparecer depois do nascimento do beb.  Voc poder notar alteraes nos seus cabelos. Essas alteraes incluem espessamento, crescimento rpido e alteraes na textura do seu cabelo. Algumas mulheres tambm sofrem queda de cabelos durante ou aps a gravidez, ou cabelos com aspecto seco e fino. Seus cabelos provavelmente voltaro ao normal depois do nascimento do beb. O que esperar das suas consultas pr-natais Durante uma consulta pr-natal de rotina:  Voc ser pesada para garantir que voc e o feto estejam crescendo normalmente.  Sua presso arterial ser aferida.  Seu abdome ser medido para acompanhar o crescimento de seu beb.  Os batimentos cardacos do feto sero auscultados.  Todos os [employer] da consulta anterior sero discutidos. Seu mdico poder lhe perguntar:  Como voc est se sentindo.  Se voc est sentindo o beb se mexer.  Se voc teve algum sintoma anormal, como vazamento de lquido, sangramento, dores de cabea intensas ou clicas abdominais.  Se voc est usando derivados do tabaco, incluindo cigarros, tabaco de Higher education careers adviser ou cigarros eletrnicos.  Se voc tem alguma dvida. Outros exames que podero ser realizados durante seu segundo trimestre incluem:  Exames de sangue que verificam: ? Baixos nveis de ferro (anemia). ? Elevado nvel de acar no sangue, que afeta gestantes (diabetes gestacional) entre a  semana 24 e a semana 28. ? Anticorpos anti-Rh. Esse exame  para verificar a presena de uma protena nas hemcias (fator Rh).  Exames de urina para verificar se h infeces, diabetes ou protena na urina.  Um ultrassom para confirmar o crescimento e desenvolvimento correto  do beb.  Uma amniocentese para verificar possveis problemas genticos.  Avaliaes fetais para espinha bfida e sndrome de [place].  Exame de HIV (vrus da imunodeficincia humana). Os exames pr-natais de rotina incluem testes de HIV, a menos que voc opte por no realizar esse teste. Siga essas instrues em casa: Medicamentos  Siga as instrues do seu mdico com relao ao uso de medicamentos. Medicamentos especficos podem ser seguros ou perigosos de serem tomados durante a Cytogeneticist.  Tome vitaminas pr-natais que contenham pelo menos 600 microgramas (mcg) de cido flico.  Se sentir priso de ventre, experimente tomar um emoliente fecal, caso seu mdico aprove isso. Alimentos e [id number]   Guy Sandifer dieta equilibrada que inclua frutas e verduras frescas, gros integrais e boas fontes de protena, como carne, ovos ou tofu e produtos lcteos com baixo teor de [place]. Seu mdico ajudar a determinar a quantidade de ganho de peso correta para voc.  Evite carnes cruas e queijo no cozido. Eles carregam germes que podem causar defeitos de nascena no beb.  Caso voc ingira pouco clcio com os alimentos, converse com seu mdico para saber se voc deve tomar um suplemento dirio de clcio.  Limite os alimentos ricos em gordura e acares processados, tais como frituras e doces.  Para prevenir a priso de ventre: ? Beba lquidos em quantidade suficiente para manter sua urina clara ou na cor amarela plida. ? Coma alimentos ricos em [place], como frutas e verduras frescas, gros integrais e feijo. Atividades  Faa exerccios apenas conforme orientado pelo seu mdico. A maioria das mulheres pode continuar seus exerccios usuais durante a Occupational hygienist. Tente fazer exerccios por 30 minutos pelo menos 5 dias por semana. Pare de se exercitar caso sinta contraes uterinas.  Evite levantar peso, use sapatos sem salto e mantenha uma boa postura corporal.  As relaes sexuais podem continuar a  menos que seu mdico a oriente de [employer]. [place] dor e do desconforto  Use um bom suti de sustentao para evitar desconforto oriundo da sensibilidade nos seios.  Tome banhos mornos de assento para Runner, broadcasting/film/video dor ou desconforto causado pelas hemorroidas. Use um creme para hemorroidas caso seu mdico aprove.  Repouse com as pernas elevadas se tiver cibras nas pernas ou dor lombar.  Se apresentar veias varicosas, use meias elsticas. Eleve os ps por 15 minutos 3-4 vezes por dia. Modere a quantidade de sal em sua dieta. Cuidados pr-natais  Anote suas perguntas. Leve-as s consultas pr-natais.  Comparea a todas as suas consultas pr-natais como orientado pelo seu mdico. Isso  importante. Segurana  Sempre use o cinto de Solicitor.  Faa uma lista de nmeros de telefone de Careers information officer, incluindo nmeros de parentes, amigos, do hospital, da [employer]. Instrues gerais  Pea uma indicao de aulas locais de educao pr-natal ao seu mdico. Comece as aulas antes do incio do 6 ms de [employer].  Pea ajuda caso precise de aconselhamento ou de informaes nutricionais durante a gestao. Seu mdico poder oferecer orientao ou encaminh-la a um especialista para que voc obtenha ajuda sobre necessidades diversas.  No use banheiras de agu quente, saunas secas ou midas.  No tome duchas e no use absorventes internos ou absorventes perfumados.  No cruze suas pernas por longos perodos de tempo.  Mantenha distncia de caixas de areia de gatos e de terrenos onde houver gatos. Eles carregam germes que podem causar defeitos de nascena no beb e possivelmente a perda do feto por aborto espontneo ou parto de natimorto.  Evite fumar, usar plantas medicinais, lcool e medicamentos no prescritos. As substncias qumicas desses produtos podem afetar a formao e o crescimento do beb.  No use nenhum produto que contenha nicotina ou  tabaco, como cigarros e cigarros eletrnicos. Caso precise de ajuda para parar de fumar, fale com seu mdico.  Consulte seu dentista se ainda no o tiver [employer]. Use uma escova de dentes macia para escovar os dentes e use o fio dental com cuidado. Entre em contato com um mdico se:  Tiver vertigem.  Tiver cibra plvica suave, presso plvica ou dor persistente na regio abdominal.  Ocorrerem enjoo, vmito ou diarreia persistentes.  Ocorrer corrimento vaginal com cheiro ruim.  Sentir dor Engineer, maintenance (IT). Tomasa Hose ajuda imediatamente se:  Tiver febre.  Notar secreo em sua vagina.  Perceber sangramento de escape ou hemorragia vaginal.  Sentir dor ou cibra intensas no abdome.  Sofrer perda ou ganho sbito de [place].  Tiver falta de ar com dor no peito.  Perceber inchao repentino ou extremo em seu rosto, mos, tornozelos, ps ou pernas.  No sentir seu beb se mexer por mais de uma hora.  Tiver dores de cabea intensas que no passam com medicao.  Sua viso ficar alterada. Resumo  O segundo trimestre da gestao vai da semana 14 at a semana 27 (do 4 ao 6 ms). Ele tambm  um perodo no qual o feto se desenvolve rapidamente.  Seu corpo passa por muitas mudanas durante a gestao. As mudanas variam de mulher para [employer].  Evite fumar, usar plantas medicinais, lcool e medicamentos no prescritos. Essas substncias qumicas afetam a formao e crescimento do beb.  No use nenhum produto de tabaco, como cigarros, tabaco de Theatre manager e cigarros eletrnicos. Caso precise de ajuda para parar de fumar, fale com seu mdico.  Caso tenha perguntas, entre em contato com o seu mdico. Comparea a todas as suas consultas como orientado pelo seu mdico. Isso  importante. Estas informaes no se destinam a substituir as recomendaes de seu mdico. No deixe de discutir quaisquer dvidas com seu mdico. Document Released: 05/06/2015 Document Revised: 08/14/2016  Document Reviewed: 08/14/2016 Elsevier Patient Education  2020 Elsevier Inc. Vaginal Birth After Cesarean Delivery  Vaginal birth after cesarean delivery (VBAC) is giving birth vaginally after previously delivering a baby through a cesarean section (C-section). A VBAC may be a safe option for you, depending on your health and other factors. It is important to discuss VBAC with your health care provider early in your pregnancy so you can understand the risks, benefits, and options. Having these discussions early will give you time to make your birth plan. Who are the best candidates for VBAC? The best candidates for VBAC are women who:  Have had one or two prior cesarean deliveries, and the incision made during the delivery was horizontal (low transverse).  Do not have a vertical (classical) scar on their uterus.  Have not had a tear in the wall of their uterus (uterine rupture).  Plan to have more pregnancies. A VBAC is also more likely to be successful:  In women who have previously given birth vaginally.  When labor starts by itself (spontaneously) before the due date. What are the benefits of  VBAC? The benefits of delivering your baby vaginally instead of by a cesarean delivery include:  A shorter hospital stay.  A faster recovery time.  Less pain.  Avoiding risks associated with major surgery, such as infection and blood clots.  Less blood loss and less need for donated blood (transfusions). What are the risks of VBAC? The main risk of attempting a VBAC is that it may fail, forcing your health care provider to deliver your baby by a C-section. Other risks are rare and include:  Tearing (rupture) of the scar from a past cesarean delivery.  Other risks associated with vaginal deliveries. If a repeat cesarean delivery is needed, the risks include:  Blood loss.  Infection.  Blood clot.  Damage to surrounding organs.  Removal of the uterus (hysterectomy), if it is  damaged.  Placenta problems in future pregnancies. What else should I know about my options? Delivering a baby through a VBAC is similar to having a normal spontaneous vaginal delivery. Therefore, it is safe:  To try with twins.  For your health care provider to try to turn the baby from a breech position (external cephalic version) during labor.  With epidural analgesia for pain relief. Consider where you would like to deliver your baby. VBAC should be attempted in facilities where an emergency cesarean delivery can be performed. VBAC is not recommended for home births. Any changes in your health or your baby's health during your pregnancy may make it necessary to change your initial decision about VBAC. Your health care provider may recommend that you do not attempt a VBAC if:  Your baby's suspected weight is 8.8 lb (4 kg) or more.  You have preeclampsia. This is a condition that causes high blood pressure along with other symptoms, such as swelling and headaches.  You will have VBAC less than 19 months after your cesarean delivery.  You are past your due date.  You need to have labor started (induced) because your cervix is not ready for labor (unfavorable). Where to find more information  American Pregnancy Association: americanpregnancy.org  [employer] Kiewit Sonsmerican Congress of Obstetricians and Gynecologists: acog.org Summary  Vaginal birth after cesarean delivery (VBAC) is giving birth vaginally after previously delivering a baby through a cesarean section (C-section). A VBAC may be a safe option for you, depending on your health and other factors.  Discuss VBAC with your health care provider early in your pregnancy so you can understand the risks, benefits, options, and have plenty of time to make your birth plan.  The main risk of attempting a VBAC is that it may fail, forcing your health care provider to deliver your baby by a C-section. Other risks are rare. This information is not  intended to replace advice given to you by your health care provider. Make sure you discuss any questions you have with your health care provider. Document Released: 09/30/2006 Document Revised: 08/05/2018 Document Reviewed: 07/17/2016 Elsevier Patient Education  2020 [employer]

## 2019-01-28 LAB — CBC
Hematocrit: 37.1 % (ref 34.0–46.6)
Hemoglobin: 12.5 g/dL (ref 11.1–15.9)
MCH: 30.2 pg (ref 26.6–33.0)
MCHC: 33.7 g/dL (ref 31.5–35.7)
MCV: 90 fL (ref 79–97)
Platelets: 358 10*3/uL (ref 150–450)
RBC: 4.14 x10E6/uL (ref 3.77–5.28)
RDW: 12.4 % (ref 11.7–15.4)
WBC: 11.9 10*3/uL — ABNORMAL HIGH (ref 3.4–10.8)

## 2019-01-28 LAB — RPR: RPR Ser Ql: NONREACTIVE

## 2019-01-28 LAB — GLUCOSE TOLERANCE, 2 HOURS W/ 1HR
Glucose, 1 hour: 66 mg/dL (ref 65–179)
Glucose, 2 hour: 100 mg/dL (ref 65–152)
Glucose, Fasting: 75 mg/dL (ref 65–91)

## 2019-01-28 LAB — HIV ANTIBODY (ROUTINE TESTING W REFLEX): HIV Screen 4th Generation wRfx: NONREACTIVE

## 2019-01-28 LAB — FETAL FIBRONECTIN

## 2019-02-10 ENCOUNTER — Encounter (HOSPITAL_COMMUNITY): Payer: Self-pay | Admitting: *Deleted

## 2019-02-10 ENCOUNTER — Other Ambulatory Visit: Payer: Self-pay

## 2019-02-10 ENCOUNTER — Inpatient Hospital Stay (HOSPITAL_COMMUNITY)
Admission: AD | Admit: 2019-02-10 | Discharge: 2019-02-10 | Disposition: A | Discharge status: Home / Self Care | Payer: Medicaid Other | Attending: Obstetrics & Gynecology | Admitting: Obstetrics & Gynecology

## 2019-02-10 DIAGNOSIS — O26892 Other specified pregnancy related conditions, second trimester: Secondary | ICD-10-CM | POA: Diagnosis present

## 2019-02-10 DIAGNOSIS — Z0371 Encounter for suspected problem with amniotic cavity and membrane ruled out: Secondary | ICD-10-CM

## 2019-02-10 DIAGNOSIS — O2342 Unspecified infection of urinary tract in pregnancy, second trimester: Secondary | ICD-10-CM | POA: Diagnosis not present

## 2019-02-10 DIAGNOSIS — Z3A26 26 weeks gestation of pregnancy: Secondary | ICD-10-CM | POA: Diagnosis not present

## 2019-02-10 DIAGNOSIS — R102 Pelvic and perineal pain: Secondary | ICD-10-CM | POA: Diagnosis present

## 2019-02-10 HISTORY — DX: Unspecified ovarian cyst, unspecified side: N83.209

## 2019-02-10 LAB — URINALYSIS, ROUTINE W REFLEX MICROSCOPIC
Bilirubin Urine: NEGATIVE
Glucose, UA: NEGATIVE mg/dL
Hgb urine dipstick: NEGATIVE
Ketones, ur: NEGATIVE mg/dL
Nitrite: NEGATIVE
Protein, ur: NEGATIVE mg/dL
Specific Gravity, Urine: 1.018 (ref 1.005–1.030)
pH: 6 (ref 5.0–8.0)

## 2019-02-10 LAB — AMNISURE RUPTURE OF MEMBRANE (ROM) NOT AT ARMC: Amnisure ROM: NEGATIVE

## 2019-02-10 MED ORDER — NITROFURANTOIN MONOHYD MACRO 100 MG PO CAPS: 100.0000 mg | ORAL_CAPSULE | Freq: Two times a day (BID) | ORAL | 0 refills | Status: DC

## 2019-02-10 NOTE — MAU Note (Signed)
Feels like she has a fever, feels really hot in her body, has a HA, took Tylenol, no relief.  Past couple weeks has been feeling pressure.  Was going to the bathroom and felt a gush come out, ? Still coming.  Has been having some leakage when she sneezes.  (has not checked temp, denies cough, sore throat, no n/v/d, no change in taste or sense of smell)

## 2019-02-10 NOTE — Discharge Instructions (Signed)
Pregnancy and Urinary Tract Infection ° °A urinary tract infection (UTI) is an infection of any part of the urinary tract. This includes the kidneys, the tubes that connect your kidneys to your bladder (ureters), the bladder, and the tube that carries urine out of your body (urethra). These organs make, store, and get rid of urine in the body. Your health care provider may use other names to describe the infection. An upper UTI affects the ureters and kidneys (pyelonephritis). A lower UTI affects the bladder (cystitis) and urethra (urethritis). °Most urinary tract infections are caused by bacteria in your genital area, around the entrance to your urinary tract (urethra). These bacteria grow and cause irritation and inflammation of your urinary tract. You are more likely to develop a UTI during pregnancy because the physical and hormonal changes your body goes through can make it easier for bacteria to get into your urinary tract. Your growing baby also puts pressure on your bladder and can affect urine flow. It is important to recognize and treat UTIs in pregnancy because of the risk of serious complications for both you and your baby. °How does this affect me? °Symptoms of a UTI include: °· Needing to urinate right away (urgently). °· Frequent urination or passing small amounts of urine frequently. °· Pain or burning with urination. °· Blood in the urine. °· Urine that smells bad or unusual. °· Trouble urinating. °· Cloudy urine. °· Pain in the abdomen or lower back. °· Vaginal discharge. °You may also have: °· Vomiting or a decreased appetite. °· Confusion. °· Irritability or tiredness. °· A fever. °· Diarrhea. °How does this affect my baby? °An untreated UTI during pregnancy could lead to a kidney infection or a systemic infection, which can cause health problems that could affect your baby. Possible complications of an untreated UTI include: °· Giving birth to your baby before 37 weeks of pregnancy  (premature). °· Having a baby with a low birth weight. °· Developing high blood pressure during pregnancy (preeclampsia). °· Having a low hemoglobin level (anemia). °What can I do to lower my risk? °To prevent a UTI: °· Go to the bathroom as soon as you feel the need. Do not hold urine for long periods of time. °· Always wipe from front to back, especially after a bowel movement. Use each tissue one time when you wipe. °· Empty your bladder after sex. °· Keep your genital area dry. °· Drink 6-10 glasses of water each day. °· Do not douche or use deodorant sprays. °How is this treated? °Treatment for this condition may include: °· Antibiotic medicines that are safe to take during pregnancy. °· Other medicines to treat less common causes of UTI. °Follow these instructions at home: °· If you were prescribed an antibiotic medicine, take it as told by your health care provider. Do not stop using the antibiotic even if you start to feel better. °· Keep all follow-up visits as told by your health care provider. This is important. °Contact a health care provider if: °· Your symptoms do not improve or they get worse. °· You have abnormal vaginal discharge. °Get help right away if you: °· Have a fever. °· Have nausea and vomiting. °· Have back or side pain. °· Feel contractions in your uterus. °· Have lower belly pain. °· Have a gush of fluid from your vagina. °· Have blood in your urine. °Summary °· A urinary tract infection (UTI) is an infection of any part of the urinary tract, which includes the   kidneys, ureters, bladder, and urethra. °· Most urinary tract infections are caused by bacteria in your genital area, around the entrance to your urinary tract (urethra). °· You are more likely to develop a UTI during pregnancy. °· If you were prescribed an antibiotic medicine, take it as told by your health care provider. Do not stop using the antibiotic even if you start to feel better. °This information is not intended to  replace advice given to you by your health care provider. Make sure you discuss any questions you have with your health care provider. °Document Released: 08/04/2010 Document Revised: 08/01/2018 Document Reviewed: 03/13/2018 °Elsevier Patient Education © 2020 Elsevier Inc. ° °

## 2019-02-10 NOTE — MAU Provider Note (Signed)
Chief Complaint:  Rupture of Membranes and pelvic pressure   First Provider Initiated Contact with Patient 02/10/19 1733     HPI: Sharon Garner is a 28 y.o. [id number] at 98w1dwho presents to maternity admissions reporting leaking fluid & pelvic pressure. Symptoms started this morning. Felt like she was leaking clear fluid that continued. Also reports mid lower abdominal cramping & lots of pelvic pressure. Felt hot but didn't check her temperature & no chills. Denies n/v/d, dysuria, hematuria, flank pain, vaginal bleeding, or recent intercourse. Good fetal movement.  Location: abdomen Quality: cramping, pressure Severity: 7/10 in pain scale Duration: 1 1day Timing: intermittent Modifying factors: none Associated signs and symptoms: LOF  Pregnancy Course: care at [facility]. Hx c/section.   Past Medical History:  Diagnosis Date  . Migraine   . Ovarian cyst    OB History  Gravida Para Term Preterm AB Living  _0 SAB TAB Ectopic Multiple Live Births  2       2    # Outcome Date GA Lbr Len/2nd Weight Sex Delivery Anes PTL Lv  5 Current           4 SAB 2020          3 Term 10/17/12 [id number] g F CS-LTranv Spinal  LIV  2 Term 2010    F Vag-Spont   LIV  1 SAB            Past Surgical History:  Procedure Laterality Date  . CESAREAN SECTION N/A 10/17/2012   Procedure: CESAREAN SECTION;  Surgeon: UgOsborne OmanMD;  [facility];  Service: Obstetrics;  Laterality: N/A;   Family History  Problem Relation Age of Onset  . Cancer Mother        reproductive  . Other Neg Hx   . Alcohol abuse Neg Hx   . Arthritis Neg Hx   . Asthma Neg Hx   . Birth defects Neg Hx   . COPD Neg Hx   . Depression Neg Hx   . Diabetes Neg Hx   . Drug abuse Neg Hx   . Early death Neg Hx   . Hearing loss Neg Hx   . Heart disease Neg Hx   . Hyperlipidemia Neg Hx   . Hypertension Neg Hx   . Kidney disease Neg Hx   . Learning disabilities Neg Hx   . Mental illness Neg Hx   . Mental  retardation Neg Hx   . Miscarriages / Stillbirths Neg Hx   . Stroke Neg Hx   . Vision loss Neg Hx    Social History   Tobacco Use  . Smoking status: Never Smoker  . Smokeless tobacco: Never Used  Substance Use Topics  . Alcohol use: No  . Drug use: No   No Known Allergies Medications Prior to Admission  Medication Sig Dispense Refill Last Dose  . acetaminophen (TYLENOL) 500 MG tablet Take 500 mg by mouth every 6 (six) hours as needed for mild pain, moderate pain, fever or headache.   02/10/2019 at 10000  . ondansetron (ZOFRAN) 8 MG tablet Take 1 tablet (8 mg total) by mouth every 8 (eight) hours as needed for nausea or vomiting. 1/2-1 tab 20 tablet 1 Past Month at Unknown time  . pantoprazole (PROTONIX) 20 MG tablet Take 1 tablet (20 mg total) by mouth daily. 30 tablet 0 Past Month at Unknown time  . Prenatal Vit-Fe Fumarate-FA (MULTIVITAMIN-PRENATAL) 27-0.8 MG TABS  tablet Take 1 tablet by mouth daily at 12 noon.   02/09/2019 at Unknown time  . AMBULATORY NON FORMULARY MEDICATION 1 Device by Other route once a week. Blood pressure cuff/medium DX: LROB Z34.90 1 kit 0   . Doxylamine-Pyridoxine 10-10 MG TBEC Take by mouth.   More than a month  . famotidine (PEPCID) 20 MG tablet Take by mouth.   More than a month  . metoCLOPramide (REGLAN) 10 MG tablet Take 1 tab q ac bid   More than a month  . polyethylene glycol (MIRALAX) 17 g packet Take 17 g by mouth daily. 14 each 3 More than a month at Unknown time  . Prenatal Vit-Fe Fumarate-FA (PRENATAL VITAMIN) 27-0.8 MG TABS Take by mouth.     . promethazine (PHENERGAN) 25 MG tablet Take 1 tablet (25 mg total) by mouth every 6 (six) hours as needed for nausea or vomiting. (Patient not taking: Reported on 12/19/2018) 30 tablet 2 More than a month at Unknown time    I have reviewed patient's Past Medical Hx, Surgical Hx, Family Hx, Social Hx, medications and allergies.   ROS:  Review of Systems  Constitutional: Negative.   Gastrointestinal:  Positive for abdominal pain. Negative for constipation, diarrhea, nausea and vomiting.  Genitourinary: Positive for pelvic pain and vaginal discharge. Negative for dysuria and vaginal bleeding.    Physical Exam   Patient Vitals for the past 24 hrs:  BP Temp Temp src Pulse Resp SpO2 Weight  02/10/19 1732 115/66 - - (!) 102 - - -  02/10/19 1705 116/73 98.3 F (36.8 C) Oral (!) 109 18 99 % 76.3 kg    Constitutional: Well-developed, well-nourished female in no acute distress.  Cardiovascular: normal rate & rhythm, no murmur Respiratory: normal effort, lung sounds clear throughout GI: Abd soft, non-tender, gravid appropriate for gestational age. Pos BS x 4. No CVAT MS: Extremities nontender, no edema, normal ROM Neurologic: Alert and oriented x 4.  GU:      Pelvic: NEFG, physiologic discharge, no blood, cervix clean. No pooling of fluid.   Dilation: Closed Effacement (%): Thick Cervical Position: Posterior Station: -3 Exam by:: Jorje Guild NP  NST:  Baseline: 140 bpm, Variability: Good {> 6 bpm), Accelerations: Reactive, Decelerations: Absent and no contractions   Labs: Results for orders placed or performed during the hospital encounter of 02/10/19 (from the past 24 hour(s))  Urinalysis, Routine w reflex microscopic     Status: Abnormal   Collection Time: 02/10/19  5:09 PM  Result Value Ref Range   Color, Urine YELLOW YELLOW   APPearance CLEAR CLEAR   Specific Gravity, Urine 1.018 1.005 - 1.030   pH 6.0 5.0 - 8.0   Glucose, UA NEGATIVE NEGATIVE mg/dL   Hgb urine dipstick NEGATIVE NEGATIVE   Bilirubin Urine NEGATIVE NEGATIVE   Ketones, ur NEGATIVE NEGATIVE mg/dL   Protein, ur NEGATIVE NEGATIVE mg/dL   Nitrite NEGATIVE NEGATIVE   Leukocytes,Ua TRACE (A) NEGATIVE   RBC / HPF 0-5 0 - 5 RBC/hpf   WBC, UA 6-10 0 - 5 WBC/hpf   Bacteria, UA RARE (A) NONE SEEN   Squamous Epithelial / LPF 0-5 0 - 5   Mucus PRESENT   Amnisure rupture of membrane (rom)not at [facility]     Status: None    Collection Time: 02/10/19  5:43 PM  Result Value Ref Range   Amnisure ROM NEGATIVE     Imaging:  No results found.  MAU Course: Orders Placed This Encounter  Procedures  .  Culture, OB Urine  . Urinalysis, Routine w reflex microscopic  . Amnisure rupture of membrane (rom)not at [facility]  . Discharge patient   Meds ordered this encounter  Medications  . nitrofurantoin, macrocrystal-monohydrate, (MACROBID) 100 MG capsule    Sig: Take 1 capsule (100 mg total) by mouth 2 (two) times daily.    Dispense:  14 capsule    Refill:  0    Order Specific Question:   Supervising Provider    Answer:   Verita Schneiders A [[id number]]    MDM: Category 1 tracing. No ctx per TOCO & abdomen soft/non tender.  Cervix closed/thick/posterior.  SSE performed, no pooling. Amnisure negative  U/a with some leuks, clean catch. No CVAT & pt afebrile. Will tx based on urine results & abd cramping. Will send urine for culture.   Assessment: 1. UTI (urinary tract infection) during pregnancy, second trimester   2. [redacted] weeks gestation of pregnancy   3. Encounter for suspected PROM, with rupture of membranes not found     Plan: Discharge home in stable condition.  Preterm Labor precautions and fetal kick counts Urine culture pending Rx macrobid  Follow-up Information    [facility] Follow up.   Specialty: Obstetrics and Gynecology Why: return for worsening symptoms Contact information: [address] [id number] [place] [phone]          Allergies as of 02/10/2019   No Known Allergies     Medication List    STOP taking these medications   promethazine 25 MG tablet Commonly known as: PHENERGAN     TAKE these medications   acetaminophen 500 MG tablet Commonly known as: TYLENOL Take 500 mg by mouth every 6 (six) hours as needed for mild pain, moderate pain, fever or headache.   AMBULATORY NON FORMULARY MEDICATION 1 Device by Other  route once a week. Blood pressure cuff/medium DX: LROB Z34.90   Doxylamine-Pyridoxine 10-10 MG Tbec Take by mouth.   famotidine 20 MG tablet Commonly known as: PEPCID Take by mouth.   metoCLOPramide 10 MG tablet Commonly known as: REGLAN Take 1 tab q ac bid   multivitamin-prenatal 27-0.8 MG Tabs tablet Take 1 tablet by mouth daily at 12 noon. What changed: Another medication with the same name was removed. Continue taking this medication, and follow the directions you see here.   nitrofurantoin (macrocrystal-monohydrate) 100 MG capsule Commonly known as: MACROBID Take 1 capsule (100 mg total) by mouth 2 (two) times daily.   ondansetron 8 MG tablet Commonly known as: Zofran Take 1 tablet (8 mg total) by mouth every 8 (eight) hours as needed for nausea or vomiting. 1/2-1 tab   pantoprazole 20 MG tablet Commonly known as: Protonix Take 1 tablet (20 mg total) by mouth daily.   polyethylene glycol 17 g packet Commonly known as: MiraLax Take 17 g by mouth daily.       Jorje Guild, NP 02/10/2019 6:52 PM

## 2019-02-11 LAB — CULTURE, OB URINE: Culture: 60000 — AB

## 2019-02-12 ENCOUNTER — Telehealth: Payer: Self-pay | Admitting: Certified Nurse Midwife

## 2019-02-12 DIAGNOSIS — O2342 Unspecified infection of urinary tract in pregnancy, second trimester: Secondary | ICD-10-CM

## 2019-02-12 MED ORDER — CEPHALEXIN 500 MG PO CAPS: 500.0000 mg | ORAL_CAPSULE | Freq: Four times a day (QID) | ORAL | 0 refills | Status: AC

## 2019-02-12 NOTE — Telephone Encounter (Signed)
Pt informed need to change abx d/t UC results. Pt instructed to stop Macrobid and finish all Keflex.

## 2019-02-13 ENCOUNTER — Encounter: Payer: Medicaid Other | Admitting: Family Medicine

## 2019-02-15 ENCOUNTER — Encounter: Payer: Self-pay | Admitting: Student

## 2019-02-15 DIAGNOSIS — R8271 Bacteriuria: Secondary | ICD-10-CM | POA: Insufficient documentation

## 2019-02-24 ENCOUNTER — Encounter: Payer: Medicaid Other | Admitting: Advanced Practice Midwife

## 2019-02-26 ENCOUNTER — Other Ambulatory Visit: Payer: Self-pay

## 2019-02-26 ENCOUNTER — Ambulatory Visit (INDEPENDENT_AMBULATORY_CARE_PROVIDER_SITE_OTHER): Payer: Medicaid Other | Admitting: Family Medicine

## 2019-02-26 VITALS — BP 114/63 | HR 95 | Wt 165.0 lb

## 2019-02-26 DIAGNOSIS — R8271 Bacteriuria: Secondary | ICD-10-CM

## 2019-02-26 DIAGNOSIS — Z3480 Encounter for supervision of other normal pregnancy, unspecified trimester: Secondary | ICD-10-CM

## 2019-02-26 DIAGNOSIS — O98812 Other maternal infectious and parasitic diseases complicating pregnancy, second trimester: Secondary | ICD-10-CM

## 2019-02-26 DIAGNOSIS — Z98891 History of uterine scar from previous surgery: Secondary | ICD-10-CM

## 2019-02-26 DIAGNOSIS — O321XX0 Maternal care for breech presentation, not applicable or unspecified: Secondary | ICD-10-CM

## 2019-02-26 DIAGNOSIS — Z3A28 28 weeks gestation of pregnancy: Secondary | ICD-10-CM

## 2019-02-26 NOTE — Progress Notes (Signed)
Patient states she has been having pains with fetal movement.  Patient recently had "kidney infection" and was seen at MAU and treated. Kathrene Alu RN

## 2019-02-26 NOTE — Progress Notes (Signed)
   PRENATAL VISIT NOTE  Subjective:  Sharon Garner is a 28 y.o. [phone] at [redacted]w[redacted]d being seen today for ongoing prenatal care.  She is currently monitored for the following issues for this low-risk pregnancy and has Family history of mental retardation; Supervision of other normal pregnancy, antepartum; Hx of cesarean section; Migraine headache without aura; Cramping affecting pregnancy, antepartum; and GBS bacteriuria on their problem list.  Patient reports pain with fetal movement.  Contractions: Not present. Vag. Bleeding: None.  Movement: Present. Denies leaking of fluid.   The following portions of the patient's history were reviewed and updated as appropriate: allergies, current medications, past family history, past medical history, past social history, past surgical history and problem list.   Objective:   Vitals:   02/26/19 0951  BP: 114/63  Pulse: 95  Weight: 165 lb (74.8 kg)    Fetal Status: Fetal Heart Rate (bpm): 145 Fundal Height: 28 cm Movement: Present  Presentation: Complete Breech  General:  Alert, oriented and cooperative. Patient is in no acute distress.  Skin: Skin is warm and dry. No rash noted.   Cardiovascular: Normal heart rate noted  Respiratory: Normal respiratory effort, no problems with respiration noted  Abdomen: Soft, gravid, appropriate for gestational age.  Pain/Pressure: Present     Pelvic: Cervical exam deferred        Extremities: Normal range of motion.  Edema: None  Mental Status: Normal mood and affect. Normal behavior. Normal judgment and thought content.   Assessment and Plan:  Pregnancy: [id number] at [redacted]w[redacted]d 1. Supervision of other normal pregnancy, antepartum FHT and FH normal  2. Hx of cesarean section Would like vaginal delivery  3. GBS bacteriuria Intrapartum treatment.  4. Breech position US done - baby breech  Preterm labor symptoms and general obstetric precautions including but not limited to vaginal bleeding,  contractions, leaking of fluid and fetal movement were reviewed in detail with the patient. Please refer to After Visit Summary for other counseling recommendations.   No follow-ups on file.  No future appointments.  Truett Mainland, DO

## 2019-03-11 ENCOUNTER — Other Ambulatory Visit: Payer: Self-pay

## 2019-03-11 ENCOUNTER — Encounter (HOSPITAL_COMMUNITY): Payer: Self-pay

## 2019-03-11 ENCOUNTER — Inpatient Hospital Stay (HOSPITAL_COMMUNITY)
Admission: AD | Admit: 2019-03-11 | Discharge: 2019-03-11 | Disposition: A | Discharge status: Home / Self Care | Payer: Medicaid Other | Attending: Obstetrics & Gynecology | Admitting: Obstetrics & Gynecology

## 2019-03-11 ENCOUNTER — Telehealth: Payer: Self-pay

## 2019-03-11 ENCOUNTER — Inpatient Hospital Stay (HOSPITAL_COMMUNITY): Payer: Medicaid Other

## 2019-03-11 DIAGNOSIS — M546 Pain in thoracic spine: Secondary | ICD-10-CM | POA: Diagnosis not present

## 2019-03-11 DIAGNOSIS — R109 Unspecified abdominal pain: Secondary | ICD-10-CM | POA: Diagnosis not present

## 2019-03-11 DIAGNOSIS — Z79899 Other long term (current) drug therapy: Secondary | ICD-10-CM | POA: Diagnosis not present

## 2019-03-11 DIAGNOSIS — O26893 Other specified pregnancy related conditions, third trimester: Secondary | ICD-10-CM | POA: Diagnosis present

## 2019-03-11 DIAGNOSIS — J029 Acute pharyngitis, unspecified: Secondary | ICD-10-CM | POA: Diagnosis not present

## 2019-03-11 DIAGNOSIS — J069 Acute upper respiratory infection, unspecified: Secondary | ICD-10-CM

## 2019-03-11 DIAGNOSIS — O98513 Other viral diseases complicating pregnancy, third trimester: Secondary | ICD-10-CM

## 2019-03-11 DIAGNOSIS — R05 Cough: Secondary | ICD-10-CM

## 2019-03-11 DIAGNOSIS — Z20828 Contact with and (suspected) exposure to other viral communicable diseases: Secondary | ICD-10-CM | POA: Diagnosis not present

## 2019-03-11 DIAGNOSIS — R059 Cough, unspecified: Secondary | ICD-10-CM

## 2019-03-11 DIAGNOSIS — Z20822 Contact with and (suspected) exposure to covid-19: Secondary | ICD-10-CM

## 2019-03-11 DIAGNOSIS — Z3A30 30 weeks gestation of pregnancy: Secondary | ICD-10-CM | POA: Diagnosis not present

## 2019-03-11 LAB — URINALYSIS, ROUTINE W REFLEX MICROSCOPIC
Bilirubin Urine: NEGATIVE
Glucose, UA: NEGATIVE mg/dL
Hgb urine dipstick: NEGATIVE
Ketones, ur: NEGATIVE mg/dL
Nitrite: NEGATIVE
Protein, ur: NEGATIVE mg/dL
Specific Gravity, Urine: 1.014 (ref 1.005–1.030)
pH: 6 (ref 5.0–8.0)

## 2019-03-11 LAB — SARS CORONAVIRUS 2 (TAT 6-24 HRS): SARS Coronavirus 2: NEGATIVE

## 2019-03-11 MED ORDER — CYCLOBENZAPRINE HCL 10 MG PO TABS: 10.0000 mg | ORAL_TABLET | Freq: Three times a day (TID) | ORAL | 0 refills | Status: DC | PRN

## 2019-03-11 MED ORDER — ACETAMINOPHEN 500 MG PO TABS
1000.0000 mg | ORAL_TABLET | Freq: Once | ORAL | Status: AC
  Administered 2019-03-11: 1000 mg via ORAL
  Filled 2019-03-11: qty 2

## 2019-03-11 MED ORDER — GUAIFENESIN 100 MG/5ML PO SOLN: 15.0000 mL | ORAL | 0 refills | Status: DC | PRN

## 2019-03-11 MED ORDER — BENZONATATE 100 MG PO CAPS: 200.0000 mg | ORAL_CAPSULE | Freq: Four times a day (QID) | ORAL | 0 refills | Status: DC | PRN

## 2019-03-11 MED ORDER — GUAIFENESIN 100 MG/5ML PO SOLN
5.0000 mL | ORAL | Status: DC | PRN
  Filled 2019-03-11: qty 15

## 2019-03-11 MED ORDER — GUAIFENESIN 100 MG/5ML PO SOLN
15.0000 mL | ORAL | Status: DC | PRN
  Administered 2019-03-11: 20:00:00 300 mg via ORAL

## 2019-03-11 NOTE — Discharge Instructions (Signed)
Cough, Adult Coughing is a reflex that clears your throat and your airways (respiratory system). Coughing helps to heal and protect your lungs. It is normal to cough occasionally, but a cough that happens with other symptoms or lasts a long time may be a sign of a condition that needs treatment. An acute cough may only last 2-3 weeks, while a chronic cough may last 8 or more weeks. Coughing is commonly caused by:  Infection of the respiratory systemby viruses or bacteria.  Breathing in substances that irritate your lungs.  Allergies.  Asthma.  Mucus that runs down the back of your throat (postnasal drip).  Smoking.  Acid backing up from the stomach into the esophagus (gastroesophageal reflux).  Certain medicines.  Chronic lung problems.  Other medical conditions such as heart failure or a blood clot in the lung (pulmonary embolism). Follow these instructions at home: Medicines  Take over-the-counter and prescription medicines only as told by your health care provider.  Talk with your health care provider before you take a cough suppressant medicine. Lifestyle   Avoid cigarette smoke. Do not use any products that contain nicotine or tobacco, such as cigarettes, e-cigarettes, and chewing tobacco. If you need help quitting, ask your health care provider.  Drink enough fluid to keep your urine pale yellow.  Avoid caffeine.  Do not drink alcohol if your health care provider tells you not to drink. General instructions   Pay close attention to changes in your cough. Tell your health care provider about them.  Always cover your mouth when you cough.  Avoid things that make you cough, such as perfume, candles, cleaning products, or campfire or tobacco smoke.  If the air is dry, use a cool mist vaporizer or humidifier in your bedroom or your home to help loosen secretions.  If your cough is worse at night, try to sleep in a semi-upright position.  Rest as needed.  Keep  all follow-up visits as told by your health care provider. This is important. Contact a health care provider if you:  Have new symptoms.  Cough up pus.  Have a cough that does not get better after 2-3 weeks or gets worse.  Cannot control your cough with cough suppressant medicines and you are losing sleep.  Have pain that gets worse or pain that is not helped with medicine.  Have a fever.  Have unexplained weight loss.  Have night sweats. Get help right away if:  You cough up blood.  You have difficulty breathing.  Your heartbeat is very fast. These symptoms may represent a serious problem that is an emergency. Do not wait to see if the symptoms will go away. Get medical help right away. Call your local emergency services (911 in the [place] Do not drive yourself to the hospital. Summary  Coughing is a reflex that clears your throat and your airways. It is normal to cough occasionally, but a cough that happens with other symptoms or lasts a long time may be a sign of a condition that needs treatment.  Take over-the-counter and prescription medicines only as told by your health care provider.  Always cover your mouth when you cough.  Contact a health care provider if you have new symptoms or a cough that does not get better after 2-3 weeks or gets worse. This information is not intended to replace advice given to you by your health care provider. Make sure you discuss any questions you have with your health care provider. Document Released: 10/06/2010  Document Revised: 04/28/2018 Document Reviewed: 04/28/2018 Elsevier Patient Education  [place] A cool mist vaporizer is a device that releases a cool mist into the air. If you have a cough or a cold, using a vaporizer may help relieve your symptoms. The mist adds moisture to the air, which may help thin your mucus and make it less sticky. When your mucus is thin and less sticky, it easier for you to  breathe and to cough up secretions. Do not use a vaporizer if you are allergic to mold. Follow these instructions at home:  Follow the instructions that come with the vaporizer.  Do not use anything other than distilled water in the vaporizer.  Do not run the vaporizer all of the time. Doing that can cause mold or bacteria to grow in the vaporizer.  Clean the vaporizer after each time that you use it.  Clean and dry the vaporizer well before storing it.  Stop using the vaporizer if your breathing symptoms get worse. This information is not intended to replace advice given to you by your health care provider. Make sure you discuss any questions you have with your health care provider. Document Released: 01/05/2004 Document Revised: 04/26/2016 Document Reviewed: 07/09/2015 Elsevier Patient Education  2020 [employer].

## 2019-03-11 NOTE — MAU Provider Note (Signed)
History     CSN: [id number]  Arrival date and time: 03/11/19 1710   First Provider Initiated Contact with Patient 03/11/19 1827      Chief Complaint  Patient presents with   Abdominal Pain   Sore Throat   Cough   HPI   [name] is a 28 y.o. female [phone] @ 44w3dhere in MAU with: sore throat, runny nose, and cough that started yesterday. No fever or chills. No one in her home is sick. She has had no contacts. She also complains of upper back pain on both sides. She has been coughing really hard and it makes her back hurt worse. The pain is near her upper abdomen however more so in her upper back. This pain started shortly after her cough.  She complains of SOB only when she coughs. She is taking tylenol for the symptoms  OB History    Gravida  5   Para  2   Term  2   Preterm      AB  2   Living  2     SAB  2   TAB      Ectopic      Multiple      Live Births  2           Past Medical History:  Diagnosis Date   Migraine    Ovarian cyst     Past Surgical History:  Procedure Laterality Date   CESAREAN SECTION N/A 10/17/2012   Procedure: CESAREAN SECTION;  Surgeon: UOsborne Oman MD;  [facility];  Service: Obstetrics;  Laterality: N/A;    Family History  Problem Relation Age of Onset   Cancer Mother        reproductive   Other Neg Hx    Alcohol abuse Neg Hx    Arthritis Neg Hx    Asthma Neg Hx    Birth defects Neg Hx    COPD Neg Hx    Depression Neg Hx    Diabetes Neg Hx    Drug abuse Neg Hx    Early death Neg Hx    Hearing loss Neg Hx    Heart disease Neg Hx    Hyperlipidemia Neg Hx    Hypertension Neg Hx    Kidney disease Neg Hx    Learning disabilities Neg Hx    Mental illness Neg Hx    Mental retardation Neg Hx    Miscarriages / Stillbirths Neg Hx    Stroke Neg Hx    Vision loss Neg Hx     Social History   Tobacco Use   Smoking status: Never Smoker   Smokeless tobacco:  Never Used  Substance Use Topics   Alcohol use: No   Drug use: No    Allergies: No Known Allergies  Medications Prior to Admission  Medication Sig Dispense Refill Last Dose   acetaminophen (TYLENOL) 500 MG tablet Take 500 mg by mouth every 6 (six) hours as needed for mild pain, moderate pain, fever or headache.      AMBULATORY NON FORMULARY MEDICATION 1 Device by Other route once a week. Blood pressure cuff/medium DX: LROB Z34.90 1 kit 0    Doxylamine-Pyridoxine 10-10 MG TBEC Take by mouth.      famotidine (PEPCID) 20 MG tablet Take by mouth.      metoCLOPramide (REGLAN) 10 MG tablet Take 1 tab q ac bid      ondansetron (ZOFRAN) 8 MG tablet Take 1 tablet (  8 mg total) by mouth every 8 (eight) hours as needed for nausea or vomiting. 1/2-1 tab (Patient not taking: Reported on 02/26/2019) 20 tablet 1    pantoprazole (PROTONIX) 20 MG tablet Take 1 tablet (20 mg total) by mouth daily. (Patient not taking: Reported on 02/26/2019) 30 tablet 0    polyethylene glycol (MIRALAX) 17 g packet Take 17 g by mouth daily. (Patient not taking: Reported on 02/26/2019) 14 each 3    Prenatal Vit-Fe Fumarate-FA (MULTIVITAMIN-PRENATAL) 27-0.8 MG TABS tablet Take 1 tablet by mouth daily at 12 noon.      Results for orders placed or performed during the hospital encounter of 03/11/19 (from the past 72 hour(s))  Urinalysis, Routine w reflex microscopic     Status: Abnormal   Collection Time: 03/11/19  6:00 PM  Result Value Ref Range   Color, Urine YELLOW YELLOW   APPearance HAZY (A) CLEAR   Specific Gravity, Urine 1.014 1.005 - 1.030   pH 6.0 5.0 - 8.0   Glucose, UA NEGATIVE NEGATIVE mg/dL   Hgb urine dipstick NEGATIVE NEGATIVE   Bilirubin Urine NEGATIVE NEGATIVE   Ketones, ur NEGATIVE NEGATIVE mg/dL   Protein, ur NEGATIVE NEGATIVE mg/dL   Nitrite NEGATIVE NEGATIVE   Leukocytes,Ua SMALL (A) NEGATIVE   RBC / HPF 0-5 0 - 5 RBC/hpf   WBC, UA 0-5 0 - 5 WBC/hpf   Bacteria, UA FEW (A) NONE SEEN    Squamous Epithelial / LPF 11-20 0 - 5   Mucus PRESENT     Comment: Performed at [facility], [address], [place], [place] [address]  SARS CORONAVIRUS 2 (TAT 6-24 HRS) Nasopharyngeal Nasopharyngeal Swab     Status: None   Collection Time: 03/11/19  6:44 PM   Specimen: Nasopharyngeal Swab  Result Value Ref Range   SARS Coronavirus 2 NEGATIVE NEGATIVE    Comment: (NOTE) SARS-CoV-2 target nucleic acids are NOT DETECTED. The SARS-CoV-2 RNA is generally detectable in upper and lower respiratory specimens during the acute phase of infection. Negative results do not preclude SARS-CoV-2 infection, do not rule out co-infections with other pathogens, and should not be used as the sole basis for treatment or other patient management decisions. Negative results must be combined with clinical observations, patient history, and epidemiological information. The expected result is Negative. Fact Sheet for Patients: SugarRoll.be Fact Sheet for Healthcare Providers: https://www.woods-mathews.com/ This test is not yet approved or cleared by the [place] FDA and  has been authorized for detection and/or diagnosis of SARS-CoV-2 by FDA under an Emergency Use Authorization (EUA). This EUA will remain  in effect (meaning this test can be used) for the duration of the COVID-19 declaration under Section 56 4(b)(1) of the Act, 21 U.S.C. section 360bbb-3(b)(1), unless the authorization is terminated or revoked sooner. Performed at [facility], [place] [address] [place], [place] [address]    Dg Chest Port 1 View  Result Date: 03/11/2019 CLINICAL DATA:  Cough, sore throat EXAM: PORTABLE CHEST 1 VIEW COMPARISON:  09/10/2014 chest radiograph. FINDINGS: Stable cardiomediastinal silhouette with normal heart size. No pneumothorax. No pleural effusion. Lungs appear clear, with no acute consolidative airspace disease and no pulmonary edema. IMPRESSION: No  active disease. Electronically Signed   By: Ilona Sorrel M.D.   On: 03/11/2019 19:20   Review of Systems  Constitutional: Negative for fever.  HENT: Positive for congestion and sore throat.   Respiratory: Positive for cough.   Genitourinary: Negative for dysuria.  Musculoskeletal: Positive for back pain.  Physical Exam   Blood pressure 111/66, pulse 98, temperature 98.3 F (36.8 C), resp. rate 16, SpO2 99 %, unknown if currently breastfeeding.  Physical Exam  Constitutional: She is oriented to person, place, and time. She appears well-developed and well-nourished. No distress.  HENT:  Head: Normocephalic.  Eyes: Pupils are equal, round, and reactive to light.  Cardiovascular: Normal rate.  Respiratory: Effort normal and breath sounds normal. No respiratory distress. She has no wheezes. She has no rales. She exhibits no tenderness.  Musculoskeletal: Normal range of motion.       Arms:     Comments: Bilateral tenderness near rib gage.   Neurological: She is alert and oriented to person, place, and time.  Skin: Skin is warm. She is not diaphoretic.  Psychiatric: Her behavior is normal.   Fetal Tracing: Baseline: 125 bpm Variability: Moderate  Accelerations: 15x15 Decelerations: None Toco: None  MAU Course  Procedures  MDM  Tylenol 1 gram given PO Robitussin given PO Covid swab collected Chest xray negative   Assessment and Plan   A:  1. Viral upper respiratory tract infection   2. Cough   3. COVID-19 virus test result unknown     P:  Discharge home If symptoms worsen go to [facility] ED for evaluation of URI Rx: Robitussin, Flexeril, benzocaine  Return to MAU for OB emergencies  REST, increase fluids Quarantine at this time.   Noni Saupe I, NP 03/12/2019 1:32 PM

## 2019-03-11 NOTE — MAU Note (Signed)
Covid swab collected.Pt has cough and sore throat

## 2019-03-11 NOTE — MAU Note (Signed)
.   Sharon Garner is a 28 y.o. at [redacted]w[redacted]d here in MAU reporting: sore throat and cough since yesterday. Pt also reports lower back and abdominal pain. She said she just moved and was out pressure washing her home and was wet and thinks it is coming from the weather change LMP:   onset of complaint:yesterday Pain score: 9 Vitals:   03/11/19 1730  BP: 111/66  Pulse: 98  Resp: 16  Temp: 98.3 F (36.8 C)  SpO2: 99%     FHT: 145 Lab orders placed from triage: UA

## 2019-03-11 NOTE — Telephone Encounter (Signed)
Patient called stating that she has had an increase in right sided back pain. Patient states had UTI in [place] and pain is similar but has gotten worse in the last 24 hours. Patient is [redacted] weeks pregnant and baby is moving well. Denies any bleeding.   Patient does mention she has a dry cough but denies any fever or other symptoms at this time. Patient states she recently moved and thinks its her allergies being aggravated by the move.   Patient instructed to go to [facility] for evaluation as we do not have available appointment for her today Patient states understanding and agrees with plan. Kathrene Alu RN

## 2019-03-12 LAB — CULTURE, OB URINE
Culture: <10000 — AB
Special Requests: NORMAL

## 2019-03-13 ENCOUNTER — Other Ambulatory Visit: Payer: Self-pay

## 2019-03-13 ENCOUNTER — Encounter: Payer: Self-pay | Admitting: Obstetrics & Gynecology

## 2019-03-13 ENCOUNTER — Ambulatory Visit (INDEPENDENT_AMBULATORY_CARE_PROVIDER_SITE_OTHER): Payer: Medicaid Other | Admitting: Obstetrics & Gynecology

## 2019-03-13 VITALS — BP 116/68 | HR 85 | Wt 169.0 lb

## 2019-03-13 DIAGNOSIS — Z3483 Encounter for supervision of other normal pregnancy, third trimester: Secondary | ICD-10-CM

## 2019-03-13 DIAGNOSIS — Z98891 History of uterine scar from previous surgery: Secondary | ICD-10-CM

## 2019-03-13 DIAGNOSIS — R8271 Bacteriuria: Secondary | ICD-10-CM

## 2019-03-13 DIAGNOSIS — G43009 Migraine without aura, not intractable, without status migrainosus: Secondary | ICD-10-CM

## 2019-03-13 DIAGNOSIS — Z3A30 30 weeks gestation of pregnancy: Secondary | ICD-10-CM

## 2019-03-13 DIAGNOSIS — Z3480 Encounter for supervision of other normal pregnancy, unspecified trimester: Secondary | ICD-10-CM

## 2019-03-13 NOTE — Patient Instructions (Signed)
Third Trimester of Pregnancy The third trimester is from week 28 through week 40 (months 7 through 9). The third trimester is a time when the unborn baby (fetus) is growing rapidly. At the end of the ninth month, the fetus is about 20 inches in length and weighs 6-10 pounds. Body changes during your third trimester Your body will continue to go through many changes during pregnancy. The changes vary from woman to woman. During the third trimester:  Your weight will continue to increase. You can expect to gain 25-35 pounds (11-16 kg) by the end of the pregnancy.  You may begin to get stretch marks on your hips, abdomen, and breasts.  You may urinate more often because the fetus is moving lower into your pelvis and pressing on your bladder.  You may develop or continue to have heartburn. This is caused by increased hormones that slow down muscles in the digestive tract.  You may develop or continue to have constipation because increased hormones slow digestion and cause the muscles that push waste through your intestines to relax.  You may develop hemorrhoids. These are swollen veins (varicose veins) in the rectum that can itch or be painful.  You may develop swollen, bulging veins (varicose veins) in your legs.  You may have increased body aches in the pelvis, back, or thighs. This is due to weight gain and increased hormones that are relaxing your joints.  You may have changes in your hair. These can include thickening of your hair, rapid growth, and changes in texture. Some women also have hair loss during or after pregnancy, or hair that feels dry or thin. Your hair will most likely return to normal after your baby is born.  Your breasts will continue to grow and they will continue to become tender. A yellow fluid (colostrum) may leak from your breasts. This is the first milk you are producing for your baby.  Your belly button may stick out.  You may notice more swelling in your hands,  face, or ankles.  You may have increased tingling or numbness in your hands, arms, and legs. The skin on your belly may also feel numb.  You may feel short of breath because of your expanding uterus.  You may have more problems sleeping. This can be caused by the size of your belly, increased need to urinate, and an increase in your body's metabolism.  You may notice the fetus "dropping," or moving lower in your abdomen (lightening).  You may have increased vaginal discharge.  You may notice your joints feel loose and you may have pain around your pelvic bone. What to expect at prenatal visits You will have prenatal exams every 2 weeks until week 36. Then you will have weekly prenatal exams. During a routine prenatal visit:  You will be weighed to make sure you and the baby are growing normally.  Your blood pressure will be taken.  Your abdomen will be measured to track your baby's growth.  The fetal heartbeat will be listened to.  Any test results from the previous visit will be discussed.  You may have a cervical check near your due date to see if your cervix has softened or thinned (effaced).  You will be tested for Group B streptococcus. This happens between 35 and 37 weeks. Your health care provider may ask you:  What your birth plan is.  How you are feeling.  If you are feeling the baby move.  If you have had any abnormal   symptoms, such as leaking fluid, bleeding, severe headaches, or abdominal cramping.  If you are using any tobacco products, including cigarettes, chewing tobacco, and electronic cigarettes.  If you have any questions. Other tests or screenings that may be performed during your third trimester include:  Blood tests that check for low iron levels (anemia).  Fetal testing to check the health, activity level, and growth of the fetus. Testing is done if you have certain medical conditions or if there are problems during the pregnancy.  Nonstress test  (NST). This test checks the health of your baby to make sure there are no signs of problems, such as the baby not getting enough oxygen. During this test, a belt is placed around your belly. The baby is made to move, and its heart rate is monitored during movement. What is false labor? False labor is a condition in which you feel small, irregular tightenings of the muscles in the womb (contractions) that usually go away with rest, changing position, or drinking water. These are called Braxton Hicks contractions. Contractions may last for hours, days, or even weeks before true labor sets in. If contractions come at regular intervals, become more frequent, increase in intensity, or become painful, you should see your health care provider. What are the signs of labor?  Abdominal cramps.  Regular contractions that start at 10 minutes apart and become stronger and more frequent with time.  Contractions that start on the top of the uterus and spread down to the lower abdomen and back.  Increased pelvic pressure and dull back pain.  A watery or bloody mucus discharge that comes from the vagina.  Leaking of amniotic fluid. This is also known as your "water breaking." It could be a slow trickle or a gush. Let your health care provider know if it has a color or strange odor. If you have any of these signs, call your health care provider right away, even if it is before your due date. Follow these instructions at home: Medicines  Follow your health care provider's instructions regarding medicine use. Specific medicines may be either safe or unsafe to take during pregnancy.  Take a prenatal vitamin that contains at least 600 micrograms (mcg) of folic acid.  If you develop constipation, try taking a stool softener if your health care provider approves. Eating and drinking   Eat a balanced diet that includes fresh fruits and vegetables, whole grains, good sources of protein such as meat, eggs, or tofu,  and low-fat dairy. Your health care provider will help you determine the amount of weight gain that is right for you.  Avoid raw meat and uncooked cheese. These carry germs that can cause birth defects in the baby.  If you have low calcium intake from food, talk to your health care provider about whether you should take a daily calcium supplement.  Eat four or five small meals rather than three large meals a day.  Limit foods that are high in fat and processed sugars, such as fried and sweet foods.  To prevent constipation: ? Drink enough fluid to keep your urine clear or pale yellow. ? Eat foods that are high in fiber, such as fresh fruits and vegetables, whole grains, and beans. Activity  Exercise only as directed by your health care provider. Most women can continue their usual exercise routine during pregnancy. Try to exercise for 30 minutes at least 5 days a week. Stop exercising if you experience uterine contractions.  Avoid heavy lifting.  Do   not exercise in extreme heat or humidity, or at high altitudes.  Wear low-heel, comfortable shoes.  Practice good posture.  You may continue to have sex unless your health care provider tells you otherwise. Relieving pain and discomfort  Take frequent breaks and rest with your legs elevated if you have leg cramps or low back pain.  Take warm sitz baths to soothe any pain or discomfort caused by hemorrhoids. Use hemorrhoid cream if your health care provider approves.  Wear a good support bra to prevent discomfort from breast tenderness.  If you develop varicose veins: ? Wear support pantyhose or compression stockings as told by your healthcare provider. ? Elevate your feet for 15 minutes, 3-4 times a day. Prenatal care  Write down your questions. Take them to your prenatal visits.  Keep all your prenatal visits as told by your health care provider. This is important. Safety  Wear your seat belt at all times when driving.  Make  a list of emergency phone numbers, including numbers for family, friends, the hospital, and police and fire departments. General instructions  Avoid cat litter boxes and soil used by cats. These carry germs that can cause birth defects in the baby. If you have a cat, ask someone to clean the litter box for you.  Do not travel far distances unless it is absolutely necessary and only with the approval of your health care provider.  Do not use hot tubs, steam rooms, or saunas.  Do not drink alcohol.  Do not use any products that contain nicotine or tobacco, such as cigarettes and e-cigarettes. If you need help quitting, ask your health care provider.  Do not use any medicinal herbs or unprescribed drugs. These chemicals affect the formation and growth of the baby.  Do not douche or use tampons or scented sanitary pads.  Do not cross your legs for long periods of time.  To prepare for the arrival of your baby: ? Take prenatal classes to understand, practice, and ask questions about labor and delivery. ? Make a trial run to the hospital. ? Visit the hospital and tour the maternity area. ? Arrange for maternity or paternity leave through employers. ? Arrange for family and friends to take care of pets while you are in the hospital. ? Purchase a rear-facing car seat and make sure you know how to install it in your car. ? Pack your hospital bag. ? Prepare the baby's nursery. Make sure to remove all pillows and stuffed animals from the baby's crib to prevent suffocation.  Visit your dentist if you have not gone during your pregnancy. Use a soft toothbrush to brush your teeth and be gentle when you floss. Contact a health care provider if:  You are unsure if you are in labor or if your water has broken.  You become dizzy.  You have mild pelvic cramps, pelvic pressure, or nagging pain in your abdominal area.  You have lower back pain.  You have persistent nausea, vomiting, or diarrhea.   You have an unusual or bad smelling vaginal discharge.  You have pain when you urinate. Get help right away if:  Your water breaks before 37 weeks.  You have regular contractions less than 5 minutes apart before 37 weeks.  You have a fever.  You are leaking fluid from your vagina.  You have spotting or bleeding from your vagina.  You have severe abdominal pain or cramping.  You have rapid weight loss or weight gain.  You have   shortness of breath with chest pain.  You notice sudden or extreme swelling of your face, hands, ankles, feet, or legs.  Your baby makes fewer than 10 movements in 2 hours.  You have severe headaches that do not go away when you take medicine.  You have vision changes. Summary  The third trimester is from week 28 through week 40, months 7 through 9. The third trimester is a time when the unborn baby (fetus) is growing rapidly.  During the third trimester, your discomfort may increase as you and your baby continue to gain weight. You may have abdominal, leg, and back pain, sleeping problems, and an increased need to urinate.  During the third trimester your breasts will keep growing and they will continue to become tender. A yellow fluid (colostrum) may leak from your breasts. This is the first milk you are producing for your baby.  False labor is a condition in which you feel small, irregular tightenings of the muscles in the womb (contractions) that eventually go away. These are called Braxton Hicks contractions. Contractions may last for hours, days, or even weeks before true labor sets in.  Signs of labor can include: abdominal cramps; regular contractions that start at 10 minutes apart and become stronger and more frequent with time; watery or bloody mucus discharge that comes from the vagina; increased pelvic pressure and dull back pain; and leaking of amniotic fluid. This information is not intended to replace advice given to you by your health  care provider. Make sure you discuss any questions you have with your health care provider. Document Released: 04/03/2001 Document Revised: 07/31/2018 Document Reviewed: 05/15/2016 Elsevier Patient Education  2020 [employer]  

## 2019-03-13 NOTE — Progress Notes (Signed)
   PRENATAL VISIT NOTE  Subjective:  Sharon Garner is a 28 y.o. [phone] at [redacted]w[redacted]d being seen today for ongoing prenatal care.  She is currently monitored for the following issues for this low-risk pregnancy and has Family history of mental retardation; Supervision of other normal pregnancy, antepartum; Hx of cesarean section; Migraine headache without aura; Cramping affecting pregnancy, antepartum; and GBS bacteriuria on their problem list.  Patient reports occasional contractions. Pt has URI sx. Has abd pain in her upper abd only with coughing. Contractions: Not present. Vag. Bleeding: None.  Movement: Present. Denies leaking of fluid.   The following portions of the patient's history were reviewed and updated as appropriate: allergies, current medications, past family history, past medical history, past social history, past surgical history and problem list.   Objective:   Vitals:   03/13/19 0815  BP: 116/68  Pulse: 85  Weight: 169 lb (76.7 kg)    Fetal Status: Fetal Heart Rate (bpm): 140   Movement: Present     General:  Alert, oriented and cooperative. Patient is in no acute distress.  Skin: Skin is warm and dry. No rash noted.   Cardiovascular: Normal heart rate noted  Respiratory: Normal respiratory effort, no problems with respiration noted  Abdomen: Soft, gravid, appropriate for gestational age.  Pain/Pressure: Present     Pelvic: Cervical exam deferred        Extremities: Normal range of motion.  Edema: None  Mental Status: Normal mood and affect. Normal behavior. Normal judgment and thought content.   Assessment and Plan:  Pregnancy: [id number] at [redacted]w[redacted]d 1. Supervision of other normal pregnancy, antepartum Good FM FH WNL  2. Hx of cesarean section Desires TOLAC Feels that baby is still breech. Confirmed breech at lat visit. Needs recheck at 36 weeks.   3. GBS bacteriuria needs atbx in labor.   4. Migraine without aura and without status migrainosus, not  intractable  Preterm labor symptoms and general obstetric precautions including but not limited to vaginal bleeding, contractions, leaking of fluid and fetal movement were reviewed in detail with the patient. Please refer to After Visit Summary for other counseling recommendations.   Return in about 2 weeks (around 03/27/2019) for Web based.  No future appointments.  Lavonia Drafts, MD

## 2019-03-31 ENCOUNTER — Ambulatory Visit (INDEPENDENT_AMBULATORY_CARE_PROVIDER_SITE_OTHER): Payer: Medicaid Other | Admitting: Advanced Practice Midwife

## 2019-03-31 ENCOUNTER — Other Ambulatory Visit: Payer: Self-pay

## 2019-03-31 ENCOUNTER — Encounter: Payer: Self-pay | Admitting: Advanced Practice Midwife

## 2019-03-31 VITALS — BP 119/70 | HR 94 | Wt 170.0 lb

## 2019-03-31 DIAGNOSIS — R8271 Bacteriuria: Secondary | ICD-10-CM

## 2019-03-31 DIAGNOSIS — Z3A31 31 weeks gestation of pregnancy: Secondary | ICD-10-CM

## 2019-03-31 DIAGNOSIS — Z98891 History of uterine scar from previous surgery: Secondary | ICD-10-CM

## 2019-03-31 DIAGNOSIS — Z3480 Encounter for supervision of other normal pregnancy, unspecified trimester: Secondary | ICD-10-CM

## 2019-03-31 NOTE — Patient Instructions (Signed)
Third Trimester of Pregnancy The third trimester is from week 28 through week 40 (months 7 through 9). The third trimester is a time when the unborn baby (fetus) is growing rapidly. At the end of the ninth month, the fetus is about 20 inches in length and weighs 6-10 pounds. Body changes during your third trimester Your body will continue to go through many changes during pregnancy. The changes vary from woman to woman. During the third trimester:  Your weight will continue to increase. You can expect to gain 25-35 pounds (11-16 kg) by the end of the pregnancy.  You may begin to get stretch marks on your hips, abdomen, and breasts.  You may urinate more often because the fetus is moving lower into your pelvis and pressing on your bladder.  You may develop or continue to have heartburn. This is caused by increased hormones that slow down muscles in the digestive tract.  You may develop or continue to have constipation because increased hormones slow digestion and cause the muscles that push waste through your intestines to relax.  You may develop hemorrhoids. These are swollen veins (varicose veins) in the rectum that can itch or be painful.  You may develop swollen, bulging veins (varicose veins) in your legs.  You may have increased body aches in the pelvis, back, or thighs. This is due to weight gain and increased hormones that are relaxing your joints.  You may have changes in your hair. These can include thickening of your hair, rapid growth, and changes in texture. Some women also have hair loss during or after pregnancy, or hair that feels dry or thin. Your hair will most likely return to normal after your baby is born.  Your breasts will continue to grow and they will continue to become tender. A yellow fluid (colostrum) may leak from your breasts. This is the first milk you are producing for your baby.  Your belly button may stick out.  You may notice more swelling in your hands,  face, or ankles.  You may have increased tingling or numbness in your hands, arms, and legs. The skin on your belly may also feel numb.  You may feel short of breath because of your expanding uterus.  You may have more problems sleeping. This can be caused by the size of your belly, increased need to urinate, and an increase in your body's metabolism.  You may notice the fetus "dropping," or moving lower in your abdomen (lightening).  You may have increased vaginal discharge.  You may notice your joints feel loose and you may have pain around your pelvic bone. What to expect at prenatal visits You will have prenatal exams every 2 weeks until week 36. Then you will have weekly prenatal exams. During a routine prenatal visit:  You will be weighed to make sure you and the baby are growing normally.  Your blood pressure will be taken.  Your abdomen will be measured to track your baby's growth.  The fetal heartbeat will be listened to.  Any test results from the previous visit will be discussed.  You may have a cervical check near your due date to see if your cervix has softened or thinned (effaced).  You will be tested for Group B streptococcus. This happens between 35 and 37 weeks. Your health care provider may ask you:  What your birth plan is.  How you are feeling.  If you are feeling the baby move.  If you have had any abnormal   symptoms, such as leaking fluid, bleeding, severe headaches, or abdominal cramping.  If you are using any tobacco products, including cigarettes, chewing tobacco, and electronic cigarettes.  If you have any questions. Other tests or screenings that may be performed during your third trimester include:  Blood tests that check for low iron levels (anemia).  Fetal testing to check the health, activity level, and growth of the fetus. Testing is done if you have certain medical conditions or if there are problems during the pregnancy.  Nonstress test  (NST). This test checks the health of your baby to make sure there are no signs of problems, such as the baby not getting enough oxygen. During this test, a belt is placed around your belly. The baby is made to move, and its heart rate is monitored during movement. What is false labor? False labor is a condition in which you feel small, irregular tightenings of the muscles in the womb (contractions) that usually go away with rest, changing position, or drinking water. These are called Braxton Hicks contractions. Contractions may last for hours, days, or even weeks before true labor sets in. If contractions come at regular intervals, become more frequent, increase in intensity, or become painful, you should see your health care provider. What are the signs of labor?  Abdominal cramps.  Regular contractions that start at 10 minutes apart and become stronger and more frequent with time.  Contractions that start on the top of the uterus and spread down to the lower abdomen and back.  Increased pelvic pressure and dull back pain.  A watery or bloody mucus discharge that comes from the vagina.  Leaking of amniotic fluid. This is also known as your "water breaking." It could be a slow trickle or a gush. Let your health care provider know if it has a color or strange odor. If you have any of these signs, call your health care provider right away, even if it is before your due date. Follow these instructions at home: Medicines  Follow your health care provider's instructions regarding medicine use. Specific medicines may be either safe or unsafe to take during pregnancy.  Take a prenatal vitamin that contains at least 600 micrograms (mcg) of folic acid.  If you develop constipation, try taking a stool softener if your health care provider approves. Eating and drinking   Eat a balanced diet that includes fresh fruits and vegetables, whole grains, good sources of protein such as meat, eggs, or tofu,  and low-fat dairy. Your health care provider will help you determine the amount of weight gain that is right for you.  Avoid raw meat and uncooked cheese. These carry germs that can cause birth defects in the baby.  If you have low calcium intake from food, talk to your health care provider about whether you should take a daily calcium supplement.  Eat four or five small meals rather than three large meals a day.  Limit foods that are high in fat and processed sugars, such as fried and sweet foods.  To prevent constipation: ? Drink enough fluid to keep your urine clear or pale yellow. ? Eat foods that are high in fiber, such as fresh fruits and vegetables, whole grains, and beans. Activity  Exercise only as directed by your health care provider. Most women can continue their usual exercise routine during pregnancy. Try to exercise for 30 minutes at least 5 days a week. Stop exercising if you experience uterine contractions.  Avoid heavy lifting.  Do   not exercise in extreme heat or humidity, or at high altitudes.  Wear low-heel, comfortable shoes.  Practice good posture.  You may continue to have sex unless your health care provider tells you otherwise. Relieving pain and discomfort  Take frequent breaks and rest with your legs elevated if you have leg cramps or low back pain.  Take warm sitz baths to soothe any pain or discomfort caused by hemorrhoids. Use hemorrhoid cream if your health care provider approves.  Wear a good support bra to prevent discomfort from breast tenderness.  If you develop varicose veins: ? Wear support pantyhose or compression stockings as told by your healthcare provider. ? Elevate your feet for 15 minutes, 3-4 times a day. Prenatal care  Write down your questions. Take them to your prenatal visits.  Keep all your prenatal visits as told by your health care provider. This is important. Safety  Wear your seat belt at all times when driving.  Make  a list of emergency phone numbers, including numbers for family, friends, the hospital, and police and fire departments. General instructions  Avoid cat litter boxes and soil used by cats. These carry germs that can cause birth defects in the baby. If you have a cat, ask someone to clean the litter box for you.  Do not travel far distances unless it is absolutely necessary and only with the approval of your health care provider.  Do not use hot tubs, steam rooms, or saunas.  Do not drink alcohol.  Do not use any products that contain nicotine or tobacco, such as cigarettes and e-cigarettes. If you need help quitting, ask your health care provider.  Do not use any medicinal herbs or unprescribed drugs. These chemicals affect the formation and growth of the baby.  Do not douche or use tampons or scented sanitary pads.  Do not cross your legs for long periods of time.  To prepare for the arrival of your baby: ? Take prenatal classes to understand, practice, and ask questions about labor and delivery. ? Make a trial run to the hospital. ? Visit the hospital and tour the maternity area. ? Arrange for maternity or paternity leave through employers. ? Arrange for family and friends to take care of pets while you are in the hospital. ? Purchase a rear-facing car seat and make sure you know how to install it in your car. ? Pack your hospital bag. ? Prepare the baby's nursery. Make sure to remove all pillows and stuffed animals from the baby's crib to prevent suffocation.  Visit your dentist if you have not gone during your pregnancy. Use a soft toothbrush to brush your teeth and be gentle when you floss. Contact a health care provider if:  You are unsure if you are in labor or if your water has broken.  You become dizzy.  You have mild pelvic cramps, pelvic pressure, or nagging pain in your abdominal area.  You have lower back pain.  You have persistent nausea, vomiting, or diarrhea.   You have an unusual or bad smelling vaginal discharge.  You have pain when you urinate. Get help right away if:  Your water breaks before 37 weeks.  You have regular contractions less than 5 minutes apart before 37 weeks.  You have a fever.  You are leaking fluid from your vagina.  You have spotting or bleeding from your vagina.  You have severe abdominal pain or cramping.  You have rapid weight loss or weight gain.  You have   shortness of breath with chest pain.  You notice sudden or extreme swelling of your face, hands, ankles, feet, or legs.  Your baby makes fewer than 10 movements in 2 hours.  You have severe headaches that do not go away when you take medicine.  You have vision changes. Summary  The third trimester is from week 28 through week 40, months 7 through 9. The third trimester is a time when the unborn baby (fetus) is growing rapidly.  During the third trimester, your discomfort may increase as you and your baby continue to gain weight. You may have abdominal, leg, and back pain, sleeping problems, and an increased need to urinate.  During the third trimester your breasts will keep growing and they will continue to become tender. A yellow fluid (colostrum) may leak from your breasts. This is the first milk you are producing for your baby.  False labor is a condition in which you feel small, irregular tightenings of the muscles in the womb (contractions) that eventually go away. These are called Braxton Hicks contractions. Contractions may last for hours, days, or even weeks before true labor sets in.  Signs of labor can include: abdominal cramps; regular contractions that start at 10 minutes apart and become stronger and more frequent with time; watery or bloody mucus discharge that comes from the vagina; increased pelvic pressure and dull back pain; and leaking of amniotic fluid. This information is not intended to replace advice given to you by your health  care provider. Make sure you discuss any questions you have with your health care provider. Document Released: 04/03/2001 Document Revised: 07/31/2018 Document Reviewed: 05/15/2016 Elsevier Patient Education  2020 [employer]  

## 2019-03-31 NOTE — Progress Notes (Signed)
   PRENATAL VISIT NOTE  Subjective:  Sharon Garner is a 28 y.o. [phone] at [redacted]w[redacted]d being seen today for ongoing prenatal care.  She is currently monitored for the following issues for this low-risk pregnancy and has Family history of mental retardation; Supervision of other normal pregnancy, antepartum; Hx of cesarean section; Migraine headache without aura; Cramping affecting pregnancy, antepartum; and GBS bacteriuria on their problem list.  Patient reports occasional contractions.  Contractions: Not present. Vag. Bleeding: None.  Movement: Present. Denies leaking of fluid.   The following portions of the patient's history were reviewed and updated as appropriate: allergies, current medications, past family history, past medical history, past social history, past surgical history and problem list.   Objective:   Vitals:   03/31/19 0846  BP: 119/70  Pulse: 94  Weight: 77.1 kg    Fetal Status: Fetal Heart Rate (bpm): 130   Movement: Present     General:  Alert, oriented and cooperative. Patient is in no acute distress.  Skin: Skin is warm and dry. No rash noted.   Cardiovascular: Normal heart rate noted  Respiratory: Normal respiratory effort, no problems with respiration noted  Abdomen: Soft, gravid, appropriate for gestational age.  Pain/Pressure: Present     Pelvic: Cervical exam deferred        Extremities: Normal range of motion.  Edema: None  Mental Status: Normal mood and affect. Normal behavior. Normal judgment and thought content.   Assessment and Plan:  Pregnancy: [id number] at [redacted]w[redacted]d 1. Supervision of other normal pregnancy, antepartum      Baby moving a lot, reassured this is a good thing      Intermittent contractions,  PTL precautions  2.  Hx Cesarean Section      Worried about breech presentation, wants TOLAC      Last visit pt states was told if breech, they may not recommend version       Plan confirm with [place] next visit: presentation        3.  GBS  bacteruria  4.   Migraine  Preterm labor symptoms and general obstetric precautions including but not limited to vaginal bleeding, contractions, leaking of fluid and fetal movement were reviewed in detail with the patient. Please refer to After Visit Summary for other counseling recommendations.   Return in about 3 weeks (around 04/21/2019) for [facility].  No future appointments.  Hansel Feinstein, CNM

## 2019-04-01 ENCOUNTER — Encounter: Payer: Self-pay | Admitting: Advanced Practice Midwife

## 2019-04-01 ENCOUNTER — Telehealth: Payer: Self-pay

## 2019-04-01 NOTE — Telephone Encounter (Signed)
Pt sent Mychart message stating she woke up with cramps that goes from her stomach to her back. Pt states that's her stomach gets tight and she feels the pain in her private area. Pt made aware that she is having braxton hicks contractions. Pt states baby is moving and baby is in breech position.

## 2019-04-01 NOTE — Telephone Encounter (Signed)
Error. Yahaira Bruski RN 

## 2019-04-15 ENCOUNTER — Encounter: Payer: Self-pay | Admitting: Emergency Medicine

## 2019-04-15 ENCOUNTER — Ambulatory Visit
Admission: EM | Admit: 2019-04-15 | Discharge: 2019-04-15 | Disposition: A | Discharge status: Home / Self Care | Payer: Medicaid Other

## 2019-04-15 ENCOUNTER — Inpatient Hospital Stay (HOSPITAL_COMMUNITY)
Admission: AD | Admit: 2019-04-15 | Discharge: 2019-04-15 | Disposition: A | Discharge status: Home / Self Care | Payer: Medicaid Other | Attending: Obstetrics and Gynecology | Admitting: Obstetrics and Gynecology

## 2019-04-15 ENCOUNTER — Other Ambulatory Visit: Payer: Self-pay

## 2019-04-15 ENCOUNTER — Encounter (HOSPITAL_COMMUNITY): Payer: Self-pay | Admitting: Family Medicine

## 2019-04-15 DIAGNOSIS — R109 Unspecified abdominal pain: Secondary | ICD-10-CM | POA: Insufficient documentation

## 2019-04-15 DIAGNOSIS — Z3A34 34 weeks gestation of pregnancy: Secondary | ICD-10-CM

## 2019-04-15 DIAGNOSIS — O26893 Other specified pregnancy related conditions, third trimester: Secondary | ICD-10-CM

## 2019-04-15 DIAGNOSIS — R42 Dizziness and giddiness: Secondary | ICD-10-CM | POA: Insufficient documentation

## 2019-04-15 DIAGNOSIS — O99891 Other specified diseases and conditions complicating pregnancy: Secondary | ICD-10-CM | POA: Insufficient documentation

## 2019-04-15 DIAGNOSIS — Z3A35 35 weeks gestation of pregnancy: Secondary | ICD-10-CM | POA: Insufficient documentation

## 2019-04-15 DIAGNOSIS — Z8049 Family history of malignant neoplasm of other genital organs: Secondary | ICD-10-CM | POA: Insufficient documentation

## 2019-04-15 DIAGNOSIS — O26899 Other specified pregnancy related conditions, unspecified trimester: Secondary | ICD-10-CM

## 2019-04-15 DIAGNOSIS — R5383 Other fatigue: Secondary | ICD-10-CM | POA: Insufficient documentation

## 2019-04-15 DIAGNOSIS — O34219 Maternal care for unspecified type scar from previous cesarean delivery: Secondary | ICD-10-CM | POA: Insufficient documentation

## 2019-04-15 DIAGNOSIS — R1013 Epigastric pain: Secondary | ICD-10-CM

## 2019-04-15 DIAGNOSIS — Z3689 Encounter for other specified antenatal screening: Secondary | ICD-10-CM | POA: Insufficient documentation

## 2019-04-15 LAB — CBC
HCT: 35.8 % — ABNORMAL LOW (ref 36.0–46.0)
Hemoglobin: 11.8 g/dL — ABNORMAL LOW (ref 12.0–15.0)
MCH: 27.8 pg (ref 26.0–34.0)
MCHC: 33 g/dL (ref 30.0–36.0)
MCV: 84.4 fL (ref 80.0–100.0)
Platelets: 317 10*3/uL (ref 150–400)
RBC: 4.24 MIL/uL (ref 3.87–5.11)
RDW: 12.9 % (ref 11.5–15.5)
WBC: 9.7 10*3/uL (ref 4.0–10.5)
nRBC: 0 % (ref 0.0–0.2)

## 2019-04-15 LAB — URINALYSIS, ROUTINE W REFLEX MICROSCOPIC
Bilirubin Urine: NEGATIVE
Glucose, UA: NEGATIVE mg/dL
Hgb urine dipstick: NEGATIVE
Ketones, ur: NEGATIVE mg/dL
Nitrite: NEGATIVE
Protein, ur: NEGATIVE mg/dL
Specific Gravity, Urine: 1.012 (ref 1.005–1.030)
pH: 6 (ref 5.0–8.0)

## 2019-04-15 LAB — BASIC METABOLIC PANEL
Anion gap: 11 (ref 5–15)
BUN: 5 mg/dL — ABNORMAL LOW (ref 6–20)
CO2: 18 mmol/L — ABNORMAL LOW (ref 22–32)
Calcium: 8.9 mg/dL (ref 8.9–10.3)
Chloride: 106 mmol/L (ref 98–111)
Creatinine, Ser: 0.54 mg/dL (ref 0.44–1.00)
GFR calc Af Amer: >60 mL/min (ref >60)
GFR calc non Af Amer: >60 mL/min (ref >60)
Glucose, Bld: 98 mg/dL (ref 70–99)
Potassium: 3.7 mmol/L (ref 3.5–5.1)
Sodium: 135 mmol/L (ref 135–145)

## 2019-04-15 NOTE — ED Triage Notes (Signed)
Pt presents to [facility] for assessment of upper abdominal pain, currently 8 months pregnant.  Pt states she has been having episodes for 2 weeks of feeling like she was going to pass out, and "like my blood pressure was low".  {Patient also c/o upper abdominal pain (top of fundus) and mid abdominal pain.

## 2019-04-15 NOTE — Discharge Instructions (Signed)
28 year old female [redacted] weeks gestation presenting for nearly 2-week course of epigastric pain.  Has tried Tylenol, over-the-counter antacids/GERD medications as recommended by her OB/GYN without relief.  Patient also feels like she has had episodes where she felt really dizzy, may pass out.  Denies chest pain, lightheadedness, shortness of breath today in office.  Patient is hemodynamically stable, afebrile and nontoxic today, that this provider stated that UC facility does not have necessary equipment to properly evaluate both her and fetus.  Patient agreeable to going to [facility] for further evaluation/management.  Patient electing self transport with family friend in their vehicle.

## 2019-04-15 NOTE — MAU Provider Note (Signed)
History     CSN: [id number]  Arrival date and time: 04/15/19 5364   First Provider Initiated Contact with Patient 04/15/19 1937      Chief Complaint  Patient presents with  . Fatigue  . Dizziness  . Abdominal Pain    epigastric    28 y.o. [id number] '@35'$ .2 wks presenting with 2 week hx of dizziness and weakness. Sx occurs almost every day. Feels SOB when she gets dizzy. No syncope or falls. She is eating and drinking well. Reports 3 bottles of water today. No HA or visual disturbances. Also c/o upper abdominal pain when the baby moves. Feels like baby is pulling something. Denies VB or LOF. Feels occasional tightness and pelvic pressure. +FM. Concerned that her baby is still breech.   OB History    Gravida  5   Para  2   Term  2   Preterm      AB  2   Living  2     SAB  2   TAB      Ectopic      Multiple      Live Births  2           Past Medical History:  Diagnosis Date  . Migraine   . Ovarian cyst     Past Surgical History:  Procedure Laterality Date  . CESAREAN SECTION N/A 10/17/2012   Procedure: CESAREAN SECTION;  Surgeon: Osborne Oman, MD;  Location: [facility];  Service: Obstetrics;  Laterality: N/A;    Family History  Problem Relation Age of Onset  . Cancer Mother        reproductive  . Other Neg Hx   . Alcohol abuse Neg Hx   . Arthritis Neg Hx   . Asthma Neg Hx   . Birth defects Neg Hx   . COPD Neg Hx   . Depression Neg Hx   . Diabetes Neg Hx   . Drug abuse Neg Hx   . Early death Neg Hx   . Hearing loss Neg Hx   . Heart disease Neg Hx   . Hyperlipidemia Neg Hx   . Hypertension Neg Hx   . Kidney disease Neg Hx   . Learning disabilities Neg Hx   . Mental illness Neg Hx   . Mental retardation Neg Hx   . Miscarriages / Stillbirths Neg Hx   . Stroke Neg Hx   . Vision loss Neg Hx     Social History   Tobacco Use  . Smoking status: Never Smoker  . Smokeless tobacco: Never Used  Substance Use Topics  . Alcohol use: No  . Drug  use: No    Allergies: No Known Allergies  Medications Prior to Admission  Medication Sig Dispense Refill Last Dose  . acetaminophen (TYLENOL) 500 MG tablet Take 500 mg by mouth every 6 (six) hours as needed for mild pain, moderate pain, fever or headache.   04/15/2019 at Unknown time  . Prenatal Vit-Fe Fumarate-FA (MULTIVITAMIN-PRENATAL) 27-0.8 MG TABS tablet Take 1 tablet by mouth daily at 12 noon.   Past Week at Unknown time  . AMBULATORY NON FORMULARY MEDICATION 1 Device by Other route once a week. Blood pressure cuff/medium DX: LROB Z34.90 1 kit 0   . cyclobenzaprine (FLEXERIL) 10 MG tablet Take 1 tablet (10 mg total) by mouth 3 (three) times daily as needed for muscle spasms. (Patient not taking: Reported on 03/31/2019) 30 tablet 0   . Doxylamine-Pyridoxine 10-10 MG TBEC Take  by mouth.    at not taking  . famotidine (PEPCID) 20 MG tablet Take by mouth.    at Not taking  . metoCLOPramide (REGLAN) 10 MG tablet Take 1 tab q ac bid    at not taking  . ondansetron (ZOFRAN-ODT) 4 MG disintegrating tablet Take 4 mg by mouth every 6 (six) hours as needed.    at not taking  . pantoprazole (PROTONIX) 20 MG tablet Take 1 tablet (20 mg total) by mouth daily. (Patient not taking: Reported on 03/13/2019) 30 tablet 0   . polyethylene glycol (MIRALAX) 17 g packet Take 17 g by mouth daily. (Patient not taking: Reported on 03/13/2019) 14 each 3     Review of Systems  Constitutional: Negative for chills and fever.  Eyes: Negative for visual disturbance.  Respiratory: Positive for shortness of breath.   Gastrointestinal: Negative for abdominal pain.  Genitourinary: Negative for vaginal bleeding and vaginal discharge.  Neurological: Positive for dizziness, weakness and light-headedness. Negative for syncope and headaches.   Physical Exam   Blood pressure 126/70, pulse 87, temperature 98.2 F (36.8 C), temperature source Oral, resp. rate 19, weight 77.8 kg, SpO2 100 %, unknown if currently  breastfeeding.  Physical Exam  Nursing note and vitals reviewed. Constitutional: She is oriented to person, place, and time. She appears well-developed and well-nourished.  HENT:  Head: Normocephalic and atraumatic.  Cardiovascular: Normal rate.  Respiratory: Effort normal. No respiratory distress.  GI: Soft. She exhibits no distension. There is abdominal tenderness ('@fundus'$ ).  gravid  Genitourinary:    Genitourinary Comments: VE: closed/thick   Musculoskeletal:        General: Normal range of motion.     Cervical back: Normal range of motion.  Neurological: She is alert and oriented to person, place, and time.  Skin: Skin is warm and dry.  Psychiatric: She has a normal mood and affect.  EFM: 130 bpm, mod variability, + accels, no decels Toco: rare  Bedside US: breech, fetal head directly at fundus, sub nml AFV, good movement seen  Results for orders placed or performed during the hospital encounter of 04/15/19 (from the past 24 hour(s))  Urinalysis, Routine w reflex microscopic     Status: Abnormal   Collection Time: 04/15/19  7:14 PM  Result Value Ref Range   Color, Urine YELLOW YELLOW   APPearance HAZY (A) CLEAR   Specific Gravity, Urine 1.012 1.005 - 1.030   pH 6.0 5.0 - 8.0   Glucose, UA NEGATIVE NEGATIVE mg/dL   Hgb urine dipstick NEGATIVE NEGATIVE   Bilirubin Urine NEGATIVE NEGATIVE   Ketones, ur NEGATIVE NEGATIVE mg/dL   Protein, ur NEGATIVE NEGATIVE mg/dL   Nitrite NEGATIVE NEGATIVE   Leukocytes,Ua SMALL (A) NEGATIVE   RBC / HPF 0-5 0 - 5 RBC/hpf   WBC, UA 0-5 0 - 5 WBC/hpf   Bacteria, UA RARE (A) NONE SEEN   Squamous Epithelial / LPF 11-20 0 - 5   Mucus PRESENT   CBC     Status: Abnormal   Collection Time: 04/15/19  7:44 PM  Result Value Ref Range   WBC 9.7 4.0 - 10.5 K/uL   RBC 4.24 3.87 - 5.11 MIL/uL   Hemoglobin 11.8 (L) 12.0 - 15.0 g/dL   HCT 35.8 (L) 36.0 - 46.0 %   MCV 84.4 80.0 - 100.0 fL   MCH 27.8 26.0 - 34.0 pg   MCHC 33.0 30.0 - 36.0 g/dL    RDW 12.9 11.5 - 15.5 %   Platelets 317  150 - 400 K/uL   nRBC 0.0 0.0 - 0.2 %  Basic metabolic panel     Status: Abnormal   Collection Time: 04/15/19  7:44 PM  Result Value Ref Range   Sodium 135 135 - 145 mmol/L   Potassium 3.7 3.5 - 5.1 mmol/L   Chloride 106 98 - 111 mmol/L   CO2 18 (L) 22 - 32 mmol/L   Glucose, Bld 98 70 - 99 mg/dL   BUN 5 (L) 6 - 20 mg/dL   Creatinine, Ser 0.54 0.44 - 1.00 mg/dL   Calcium 8.9 8.9 - 10.3 mg/dL   GFR calc non Af Amer >60 >60 mL/min   GFR calc Af Amer >60 >60 mL/min   Anion gap 11 5 - 15   MAU Course  Procedures  MDM Labs ordered and reviewed. No signs of PTL. Upper abdominal pain likely d/t fetal position. Discussed comfort measures. Suspect other sx are do to BP and/or BS changes from advanced pregnancy. Recommend more water intake, 5-6 bottles per day and eat more often with snacks in-between. Stable for discharge home.   Assessment and Plan   1. [redacted] weeks gestation of pregnancy   2. NST (non-stress test) reactive   3. Abdominal pain affecting pregnancy    Discharge home Follow up at [facility] as scheduled Return precautions  Allergies as of 04/15/2019   No Known Allergies     Medication List    STOP taking these medications   cyclobenzaprine 10 MG tablet Commonly known as: FLEXERIL   pantoprazole 20 MG tablet Commonly known as: Protonix   polyethylene glycol 17 g packet Commonly known as: MiraLax     TAKE these medications   acetaminophen 500 MG tablet Commonly known as: TYLENOL Take 500 mg by mouth every 6 (six) hours as needed for mild pain, moderate pain, fever or headache.   AMBULATORY NON FORMULARY MEDICATION 1 Device by Other route once a week. Blood pressure cuff/medium DX: LROB Z34.90   Doxylamine-Pyridoxine 10-10 MG Tbec Take by mouth.   famotidine 20 MG tablet Commonly known as: PEPCID Take by mouth.   metoCLOPramide 10 MG tablet Commonly known as: REGLAN Take 1 tab q ac bid   multivitamin-prenatal  27-0.8 MG Tabs tablet Take 1 tablet by mouth daily at 12 noon.   ondansetron 4 MG disintegrating tablet Commonly known as: ZOFRAN-ODT Take 4 mg by mouth every 6 (six) hours as needed.      Julianne Handler, CNM 04/15/2019, 8:00 PM

## 2019-04-15 NOTE — Discharge Instructions (Signed)
Abdominal Pain During Pregnancy ° °Belly (abdominal) pain is common during pregnancy. There are many possible causes. Most of the time, it is not a serious problem. Other times, it can be a sign that something is wrong with the pregnancy. Always tell your doctor if you have belly pain. °Follow these instructions at home: °· Do not have sex or put anything in your vagina until your pain goes away completely. °· Get plenty of rest until your pain gets better. °· Drink enough fluid to keep your pee (urine) pale yellow. °· Take over-the-counter and prescription medicines only as told by your doctor. °· Keep all follow-up visits as told by your doctor. This is important. °Contact a doctor if: °· Your pain continues or gets worse after resting. °· You have lower belly pain that: °? Comes and goes at regular times. °? Spreads to your back. °? Feels like menstrual cramps. °· You have pain or burning when you pee (urinate). °Get help right away if: °· You have a fever or chills. °· You have vaginal bleeding. °· You are leaking fluid from your vagina. °· You are passing tissue from your vagina. °· You throw up (vomit) for more than 24 hours. °· You have watery poop (diarrhea) for more than 24 hours. °· Your baby is moving less than usual. °· You feel very weak or faint. °· You have shortness of breath. °· You have very bad pain in your upper belly. °Summary °· Belly (abdominal) pain is common during pregnancy. There are many possible causes. °· If you have belly pain during pregnancy, tell your doctor right away. °· Keep all follow-up visits as told by your doctor. This is important. °This information is not intended to replace advice given to you by your health care provider. Make sure you discuss any questions you have with your health care provider. °Document Released: 03/28/2009 Document Revised: 07/28/2018 Document Reviewed: 07/12/2016 °Elsevier Patient Education © 2020 [employer] ° °

## 2019-04-15 NOTE — MAU Note (Addendum)
Patient reports to MAU c/o feeling dizzy and lightheaded. Pt reports this started a few weeks ago and progressively gotten worse. Pt denies any recent falls but occasionally feels like she is going to fall due to the dizziness. Pt report weakness. Pt reports +FM that are painful. Patient reports every time the baby moves it is painful in her epigastric region it feels as if it is pulling or burning. No vaginal bleeding or discharge. Pt reports she is breech presentation.    Pt reports when she went to the BR in triage she noticed some clear fluid.Marland KitchenMarland Kitchen

## 2019-04-15 NOTE — ED Notes (Signed)
Patient able to ambulate independently  

## 2019-04-15 NOTE — ED Provider Notes (Signed)
[facility]    CSN: [id number] Arrival date & time: 04/15/19  1621      History   Chief Complaint Chief Complaint  Patient presents with  . Near Syncope    HPI Sharon Garner is a 28 y.o. female [redacted] weeks gestation presenting for epigastric pain that has been occurring for the last 2 weeks.  States it was initially intermittent, though has become near constant for the last 10 days.  Patient has been in correspondence with her OB who recommended Tylenol.  Patient states this is not helped, she is also tried OTC antacids as she thought maybe it could be heartburn.  Patient states that baby is currently breech.  Patient has also felt like she is going to pass out like her blood pressure sugar was low.  Patient has not had a way to monitor this at home.  Denies syncopal event, chest pain, difficulty breathing.  Denies vaginal discharge, bleeding, pelvic or vaginal pain.  Past Medical History:  Diagnosis Date  . Migraine   . Ovarian cyst     Patient Active Problem List   Diagnosis Date Noted  . GBS bacteriuria 02/15/2019  . Cramping affecting pregnancy, antepartum 01/27/2019  . Supervision of other normal pregnancy, antepartum 11/18/2018  . Hx of cesarean section 11/18/2018  . Family history of mental retardation 05/16/2012  . Migraine headache without aura 11/09/2011    Past Surgical History:  Procedure Laterality Date  . CESAREAN SECTION N/A 10/17/2012   Procedure: CESAREAN SECTION;  Surgeon: Tereso NewcomerUgonna A Anyanwu, MD;  Location: [facility];  Service: Obstetrics;  Laterality: N/A;    OB History    Gravida  5   Para  2   Term  2   Preterm      AB  2   Living  2     SAB  2   TAB      Ectopic      Multiple      Live Births  2            Home Medications    Prior to Admission medications   Medication Sig Start Date End Date Taking? Authorizing Provider  acetaminophen (TYLENOL) 500 MG tablet Take 500 mg by mouth every 6 (six) hours as  needed for mild pain, moderate pain, fever or headache.    [provider]  AMBULATORY NON FORMULARY MEDICATION 1 Device by Other route once a week. Blood pressure cuff/medium DX: LROB Z34.90 11/18/18   Aviva SignsWilliams, Marie L, CNM  Doxylamine-Pyridoxine 10-10 MG TBEC Take by mouth. 09/19/18   [provider]  famotidine (PEPCID) 20 MG tablet Take by mouth. 10/23/18   [provider]  metoCLOPramide (REGLAN) 10 MG tablet Take 1 tab q ac bid 10/20/18   [provider]  ondansetron (ZOFRAN-ODT) 4 MG disintegrating tablet Take 4 mg by mouth every 6 (six) hours as needed. 11/06/18   [provider]  Prenatal Vit-Fe Fumarate-FA (MULTIVITAMIN-PRENATAL) 27-0.8 MG TABS tablet Take 1 tablet by mouth daily at 12 noon.    [provider]    Family History Family History  Problem Relation Age of Onset  . Cancer Mother        reproductive  . Other Neg Hx   . Alcohol abuse Neg Hx   . Arthritis Neg Hx   . Asthma Neg Hx   . Birth defects Neg Hx   . COPD Neg Hx   . Depression Neg Hx   . Diabetes  Neg Hx   . Drug abuse Neg Hx   . Early death Neg Hx   . Hearing loss Neg Hx   . Heart disease Neg Hx   . Hyperlipidemia Neg Hx   . Hypertension Neg Hx   . Kidney disease Neg Hx   . Learning disabilities Neg Hx   . Mental illness Neg Hx   . Mental retardation Neg Hx   . Miscarriages / Stillbirths Neg Hx   . Stroke Neg Hx   . Vision loss Neg Hx     Social History Social History   Tobacco Use  . Smoking status: Never Smoker  . Smokeless tobacco: Never Used  Substance Use Topics  . Alcohol use: No  . Drug use: No     Allergies   Patient has no known allergies.   Review of Systems Review of Systems  Constitutional: Negative for fatigue and fever.  HENT: Negative for ear pain, sinus pain, sore throat and voice change.   Eyes: Negative for pain, redness and visual disturbance.  Respiratory: Positive for shortness of breath. Negative for cough.     Cardiovascular: Negative for chest pain and palpitations.  Gastrointestinal: Positive for abdominal pain. Negative for diarrhea and vomiting.  Musculoskeletal: Negative for arthralgias and myalgias.  Skin: Negative for rash and wound.  Neurological: Positive for dizziness and weakness. Negative for syncope and headaches.     Physical Exam Triage Vital Signs ED Triage Vitals  Enc Vitals Group     BP 04/15/19 1630 116/72     Pulse Rate 04/15/19 1630 88     Resp 04/15/19 1630 16     Temp 04/15/19 1630 98.7 F (37.1 C)     Temp Source 04/15/19 1630 Oral     SpO2 04/15/19 1630 98 %     Weight --      Height --      Head Circumference --      Peak Flow --      Pain Score 04/15/19 1635 4     Pain Loc --      Pain Edu? --      Excl. in GC? --    No data found.  Updated Vital Signs BP 116/72 (BP Location: Left Arm)   Pulse 88   Temp 98.7 F (37.1 C) (Oral)   Resp 16   SpO2 98%   Visual Acuity Right Eye Distance:   Left Eye Distance:   Bilateral Distance:    Right Eye Near:   Left Eye Near:    Bilateral Near:     Physical Exam Constitutional:      General: She is not in acute distress. HENT:     Head: Normocephalic and atraumatic.  Eyes:     General: No scleral icterus.    Pupils: Pupils are equal, round, and reactive to light.  Cardiovascular:     Rate and Rhythm: Normal rate.  Pulmonary:     Effort: Pulmonary effort is normal.  Abdominal:     Comments: Top of fundus w/ TTP, no guarding  Skin:    Capillary Refill: Capillary refill takes less than 2 seconds.     Coloration: Skin is not jaundiced or pale.  Neurological:     General: No focal deficit present.     Mental Status: She is alert and oriented to person, place, and time.     Gait: Gait normal.  Psychiatric:        Mood and Affect: Mood normal.  Behavior: Behavior normal.      UC Treatments / Results  Labs (all labs ordered are listed, but only abnormal results are displayed) Labs  Reviewed - No data to display  EKG   Radiology No results found.  Procedures Procedures (including critical care time)  Medications Ordered in UC Medications - No data to display  Initial Impression / Assessment and Plan / UC Course  I have reviewed the triage vital signs and the nursing notes.  Pertinent labs & imaging results that were available during my care of the patient were reviewed by me and considered in my medical decision making (see chart for details).     Patient afebrile, nontoxic.  Refer to MAU for further work-up given [redacted] weeks gestation with fundal tenderness.  Patient electing to self transport with family member.  Return precautions discussed, patient verbalized understanding and is agreeable to plan. Final Clinical Impressions(s) / UC Diagnoses   Final diagnoses:  Abdominal pain, epigastric  [redacted] weeks gestation of pregnancy     Discharge Instructions     28 year old female [redacted] weeks gestation presenting for nearly 2-week course of epigastric pain.  Has tried Tylenol, over-the-counter antacids/GERD medications as recommended by her OB/GYN without relief.  Patient also feels like she has had episodes where she felt really dizzy, may pass out.  Denies chest pain, lightheadedness, shortness of breath today in office.  Patient is hemodynamically stable, afebrile and nontoxic today, that this provider stated that UC facility does not have necessary equipment to properly evaluate both her and fetus.  Patient agreeable to going to MAU for further evaluation/management.  Patient electing self transport with family friend in their vehicle.    ED Prescriptions    None     PDMP not reviewed this encounter.   Hall[place]Potvin, [place], [place] 04/16/19 1605

## 2019-04-22 ENCOUNTER — Other Ambulatory Visit (HOSPITAL_COMMUNITY)
Admission: RE | Admit: 2019-04-22 | Discharge: 2019-04-22 | Disposition: A | Discharge status: Home / Self Care | Payer: Medicaid Other | Source: Ambulatory Visit | Attending: Family Medicine | Admitting: Family Medicine

## 2019-04-22 ENCOUNTER — Ambulatory Visit (INDEPENDENT_AMBULATORY_CARE_PROVIDER_SITE_OTHER): Payer: Medicaid Other | Admitting: Family Medicine

## 2019-04-22 ENCOUNTER — Other Ambulatory Visit: Payer: Self-pay

## 2019-04-22 VITALS — BP 122/76 | HR 82 | Wt 172.0 lb

## 2019-04-22 DIAGNOSIS — Z3480 Encounter for supervision of other normal pregnancy, unspecified trimester: Secondary | ICD-10-CM | POA: Diagnosis not present

## 2019-04-22 DIAGNOSIS — Z3A36 36 weeks gestation of pregnancy: Secondary | ICD-10-CM

## 2019-04-22 DIAGNOSIS — Z98891 History of uterine scar from previous surgery: Secondary | ICD-10-CM

## 2019-04-22 DIAGNOSIS — O329XX0 Maternal care for malpresentation of fetus, unspecified, not applicable or unspecified: Secondary | ICD-10-CM

## 2019-04-22 DIAGNOSIS — R8271 Bacteriuria: Secondary | ICD-10-CM

## 2019-04-22 NOTE — Progress Notes (Signed)
   PRENATAL VISIT NOTE  Subjective:  Sharon Garner is a 28 y.o. [phone] at [redacted]w[redacted]d being seen today for ongoing prenatal care.  She is currently monitored for the following issues for this low-risk pregnancy and has Family history of mental retardation; Supervision of other normal pregnancy, antepartum; Hx of cesarean section; Migraine headache without aura; Cramping affecting pregnancy, antepartum; and GBS bacteriuria on their problem list.  Patient reports no complaints.  Contractions: Not present. Vag. Bleeding: None.  Movement: Present. Denies leaking of fluid.   The following portions of the patient's history were reviewed and updated as appropriate: allergies, current medications, past family history, past medical history, past social history, past surgical history and problem list.   Objective:   Vitals:   04/22/19 1316  BP: 122/76  Pulse: 82  Weight: 172 lb (78 kg)    Fetal Status: Fetal Heart Rate (bpm): 145 Fundal Height: 36 cm Movement: Present  Presentation: Complete Breech  General:  Alert, oriented and cooperative. Patient is in no acute distress.  Skin: Skin is warm and dry. No rash noted.   Cardiovascular: Normal heart rate noted  Respiratory: Normal respiratory effort, no problems with respiration noted  Abdomen: Soft, gravid, appropriate for gestational age.  Pain/Pressure: Present     Pelvic: Cervical exam deferred        Extremities: Normal range of motion.  Edema: None  Mental Status: Normal mood and affect. Normal behavior. Normal judgment and thought content.   Assessment and Plan:  Pregnancy: [id number] at [redacted]w[redacted]d 1. Supervision of other normal pregnancy, antepartum FHT and FH normal - GC/Chlamydia ([place])  2. GBS bacteriuria Intrapartum PPX  3. Hx of cesarean section Would like VBAC  4. Malposition of fetus, single or unspecified fetus Discussed ECV. Will schedule  Preterm labor symptoms and general obstetric precautions including but not  limited to vaginal bleeding, contractions, leaking of fluid and fetal movement were reviewed in detail with the patient. Please refer to After Visit Summary for other counseling recommendations.   No follow-ups on file.  Future Appointments  Date Time Provider [place]  04/22/2019  2:30 PM Truett Mainland, DO [facility] None  04/30/2019  9:45 AM Truett Mainland, DO [facility] None  05/07/2019  2:15 PM Truett Mainland, DO [facility] None    Truett Mainland, DO

## 2019-04-22 NOTE — Progress Notes (Signed)
Bedside ultrasound reveals breech presentation. Kathrene Alu RN

## 2019-04-23 LAB — GC/CHLAMYDIA PROBE AMP (~~LOC~~) NOT AT ARMC
Chlamydia: NEGATIVE
Comment: NEGATIVE
Comment: NORMAL
Neisseria Gonorrhea: NEGATIVE

## 2019-04-24 HISTORY — PX: KIDNEY STONE SURGERY: SHX686

## 2019-04-28 ENCOUNTER — Other Ambulatory Visit: Payer: Self-pay | Admitting: Advanced Practice Midwife

## 2019-04-28 ENCOUNTER — Telehealth (HOSPITAL_COMMUNITY): Payer: Self-pay | Admitting: *Deleted

## 2019-04-28 NOTE — Telephone Encounter (Signed)
Preadmission screen  

## 2019-04-29 ENCOUNTER — Other Ambulatory Visit (HOSPITAL_COMMUNITY)
Admission: RE | Admit: 2019-04-29 | Discharge: 2019-04-29 | Disposition: A | Discharge status: Home / Self Care | Payer: Medicaid Other | Source: Ambulatory Visit | Attending: Family Medicine | Admitting: Family Medicine

## 2019-04-29 DIAGNOSIS — Z20822 Contact with and (suspected) exposure to covid-19: Secondary | ICD-10-CM | POA: Insufficient documentation

## 2019-04-29 DIAGNOSIS — Z01812 Encounter for preprocedural laboratory examination: Secondary | ICD-10-CM | POA: Insufficient documentation

## 2019-04-29 LAB — SARS CORONAVIRUS 2 (TAT 6-24 HRS): SARS Coronavirus 2: NEGATIVE

## 2019-04-30 ENCOUNTER — Other Ambulatory Visit: Payer: Self-pay

## 2019-04-30 ENCOUNTER — Ambulatory Visit (INDEPENDENT_AMBULATORY_CARE_PROVIDER_SITE_OTHER): Payer: Medicaid Other | Admitting: Family Medicine

## 2019-04-30 VITALS — BP 116/72 | HR 74 | Wt 175.0 lb

## 2019-04-30 DIAGNOSIS — O99891 Other specified diseases and conditions complicating pregnancy: Secondary | ICD-10-CM | POA: Diagnosis not present

## 2019-04-30 DIAGNOSIS — M9908 Segmental and somatic dysfunction of rib cage: Secondary | ICD-10-CM

## 2019-04-30 DIAGNOSIS — M9902 Segmental and somatic dysfunction of thoracic region: Secondary | ICD-10-CM | POA: Diagnosis not present

## 2019-04-30 DIAGNOSIS — M549 Dorsalgia, unspecified: Secondary | ICD-10-CM | POA: Diagnosis not present

## 2019-04-30 DIAGNOSIS — Z3A37 37 weeks gestation of pregnancy: Secondary | ICD-10-CM

## 2019-04-30 DIAGNOSIS — R8271 Bacteriuria: Secondary | ICD-10-CM

## 2019-04-30 DIAGNOSIS — Z98891 History of uterine scar from previous surgery: Secondary | ICD-10-CM

## 2019-04-30 DIAGNOSIS — O99820 Streptococcus B carrier state complicating pregnancy: Secondary | ICD-10-CM

## 2019-04-30 DIAGNOSIS — Z3480 Encounter for supervision of other normal pregnancy, unspecified trimester: Secondary | ICD-10-CM

## 2019-04-30 NOTE — Progress Notes (Signed)
   PRENATAL VISIT NOTE  Subjective:  Sharon Garner is a 29 y.o. [phone] at [redacted]w[redacted]d being seen today for ongoing prenatal care.  She is currently monitored for the following issues for this low-risk pregnancy and has Family history of mental retardation; Supervision of other normal pregnancy, antepartum; Hx of cesarean section; Migraine headache without aura; Cramping affecting pregnancy, antepartum; and GBS bacteriuria on their problem list.  Patient reports backache.  Contractions: Irregular. Vag. Bleeding: None.  Movement: Present. Denies leaking of fluid.   The following portions of the patient's history were reviewed and updated as appropriate: allergies, current medications, past family history, past medical history, past social history, past surgical history and problem list. Problem list updated.  Objective:   Vitals:   04/30/19 0918  BP: 116/72  Pulse: 74  Weight: 175 lb (79.4 kg)    Fetal Status: Fetal Heart Rate (bpm): 144   Movement: Present     General:  Alert, oriented and cooperative. Patient is in no acute distress.  Skin: Skin is warm and dry. No rash noted.   Cardiovascular: Normal heart rate noted  Respiratory: Normal respiratory effort, no problems with respiration noted  Abdomen: Soft, gravid, appropriate for gestational age. Pain/Pressure: Present     Pelvic:  Cervical exam deferred        MSK: Restriction, tenderness, tissue texture changes, and paraspinal spasm in the right thoracic spine  Neuro: Moves all four extremities with no focal neurological deficit  Extremities: Normal range of motion.  Edema: None  Mental Status: Normal mood and affect. Normal behavior. Normal judgment and thought content.   OSE: Head   Cervical   Thoracic T8/9 FSRR  Rib 8/9 rib inhaled  Lumbar   Sacrum   Pelvis     Assessment and Plan:  Pregnancy: [id number] at [redacted]w[redacted]d  1. Supervision of other normal pregnancy, antepartum FHT and FH normal. Baby still breech - ECV  tomorrow  2. GBS bacteriuria Intrapartum ppx.  3. Hx of cesarean section Would like VBAC  4. Back pain affecting pregnancy in third trimester 5. Somatic dysfunction of rib 6. Somatic dysfunction of spine, thoracic OMT done after patient permission. HVLA technique utilized. 2 areas treated with improvement of tissue texture and joint mobility. Patient tolerated procedure well.     Preterm labor symptoms and general obstetric precautions including but not limited to vaginal bleeding, contractions, leaking of fluid and fetal movement were reviewed in detail with the patient. Please refer to After Visit Summary for other counseling recommendations.  Return in about 1 week (around 05/07/2019) for OB f/u, In Office.  Levie Heritage, DO

## 2019-05-01 ENCOUNTER — Encounter (HOSPITAL_COMMUNITY): Payer: Self-pay

## 2019-05-01 ENCOUNTER — Ambulatory Visit (HOSPITAL_COMMUNITY)
Admission: RE | Admit: 2019-05-01 | Discharge: 2019-05-01 | Disposition: A | Discharge status: Home / Self Care | Payer: Medicaid Other | Source: Ambulatory Visit | Attending: Obstetrics and Gynecology | Admitting: Obstetrics and Gynecology

## 2019-05-01 ENCOUNTER — Inpatient Hospital Stay (HOSPITAL_COMMUNITY)
Admission: AD | Admit: 2019-05-01 | Discharge: 2019-05-01 | DRG: 833 | Disposition: A | Discharge status: Home / Self Care | Payer: Medicaid Other | Attending: Obstetrics and Gynecology | Admitting: Obstetrics and Gynecology

## 2019-05-01 DIAGNOSIS — R8271 Bacteriuria: Secondary | ICD-10-CM | POA: Diagnosis present

## 2019-05-01 DIAGNOSIS — O321XX0 Maternal care for breech presentation, not applicable or unspecified: Principal | ICD-10-CM | POA: Diagnosis present

## 2019-05-01 DIAGNOSIS — Z98891 History of uterine scar from previous surgery: Secondary | ICD-10-CM

## 2019-05-01 DIAGNOSIS — O99820 Streptococcus B carrier state complicating pregnancy: Secondary | ICD-10-CM | POA: Diagnosis present

## 2019-05-01 DIAGNOSIS — Z3A37 37 weeks gestation of pregnancy: Secondary | ICD-10-CM

## 2019-05-01 DIAGNOSIS — Z3480 Encounter for supervision of other normal pregnancy, unspecified trimester: Secondary | ICD-10-CM

## 2019-05-01 LAB — ABO/RH: ABO/RH(D): A POS

## 2019-05-01 LAB — TYPE AND SCREEN
ABO/RH(D): A POS
Antibody Screen: NEGATIVE

## 2019-05-01 MED ORDER — TERBUTALINE SULFATE 1 MG/ML IJ SOLN
0.2500 mg | Freq: Once | INTRAMUSCULAR | Status: AC
  Administered 2019-05-01: 0.25 mg via SUBCUTANEOUS

## 2019-05-01 MED ORDER — TERBUTALINE SULFATE 1 MG/ML IJ SOLN
INTRAMUSCULAR | Status: AC
  Filled 2019-05-01: qty 1

## 2019-05-01 MED ORDER — LACTATED RINGERS IV SOLN: INTRAVENOUS | Status: DC

## 2019-05-01 NOTE — Progress Notes (Addendum)
External Cephalic Version Note  Patient is 29 y.o. [id number] at [redacted]w[redacted]d who presents for external cephalic version for breech presentation. I reviewed risks of external cephalic version including risk of pain, bleeding, abruption, fetal intolerance, injury to fetus, failure for turn fetus. Reviewed risk of need for urgent primary c-section for fetal intolerance, including risks of c-section of infection, bleeding, damage to surrounding tissue and organs. Reviewed that if she remains breech, would be scheduled for primary c-section. She verbalizes understanding and desires to proceed, consent signed.  An experienced assistant was required given the standard of surgical care given the complexity of the case. This assistant was needed for ultrasound, exposure, assisting with administration of pressure, and for overall help during the procedure.  Terbutaline given. Timeout performed.   The fetus was visualized on [place] to be complete breech. A forward roll toward maternal right was attempted and the fetus was attempted to be gently turned towards the cephalic position. The fetal heart rate was checked several times during the procedure via ultrasound and found to be within normal range. The fetus did not move position despite several attempts. Procedure was halted and patient was put back on continuous fetal monitoring.   The patient tolerated the procedure fairly well. She will be monitored for an hour and discharged to home with reassuring fetal tracing. Post version instructions were given.    Baldemar Lenis, M.D. [facility] [employer] [facility]

## 2019-05-01 NOTE — Discharge Instructions (Signed)
Breech Birth A breech birth is when a baby is born with the buttocks or feet first. Most babies are in a head down (vertex) position when they are born. There are three types of breech babies:  When the baby's buttocks are showing first in the vagina (birth canal) with the legs bent at the knees and the feet down near the buttocks (complete breech).  When the baby's buttocks are showing first in the birth canal with the legs straight up and the feet at the baby's head (frank breech).  When one or both of the baby's feet are showing first in the birth canal along with the buttocks (footling breech). What are the health risks of having a breech birth? Having a breech birth increases the health risks to your baby. A breech birth may cause the following:  Umbilical cord prolapse. This is when the umbilical cord enters the birth canal ahead of the baby, before or during labor. This can cause the cord to become pinched or compressed as labor continues. This can reduce the flow of blood and oxygen to the baby.  The baby getting stuck in the birth canal, which can cause injury or, rarely, death.  Injury to the baby's nerves in the shoulder, arm, and hand (brachial plexus injury) when delivered. What increases the risk of having a breech baby? It is not known what causes your baby to be breech. However, you are more likely to have a breech baby if:  You have had a previous pregnancy.  You are having more than one baby.  Your baby has certain birth (congenital) defects.  You have started your labor earlier than expected (premature labor).  You have problems with your uterus, such as a tumor or abnormally shaped uterus.  You have too much or not enough fluid surrounding the baby (amniotic fluid). How do I know if my baby is breech? There are no symptoms for you to know that your baby is breech. When you are close to your due date, your health care provider can tell if your baby is breech by  doing:  An abdominal or vaginal (pelvic) exam.  An ultrasound. What can be done if my baby is breech?  Your health care provider may try to turn the baby in your uterus. He or she will use a procedure called external cephalic version (ECV). He or she will place both hands on your abdomen and gently and slowly turn the baby around. It is important to know that ECV can increase your chances of suddenly going into labor. For this reason, an ECV is only done toward the end of a healthy pregnancy. The baby may remain in this position, but sometimes he or she may turn back to the breech position. You and your health care provider will discuss if an ECV is recommended for you and your baby. How will I delivery my baby if he or she is breech? You and your health care provider will discuss the best way to deliver your baby. If your baby is breech, it is less likely that a vaginal delivery will be recommended due to the risks to you and your baby. Your health care provider may recommend that you deliver your baby through a Cesarean section (C-section). A C-section is the surgical delivery of a baby through an incision in the abdomen and the uterus. Summary  A breech birth is when a baby is born with the buttocks or feet first.  Having a breech birth may   increase the risks to your baby.  Your health care provider may try to turn your baby in your uterus using a procedure called an external cephalic version (ECV).  If your baby cannot be turned to a head down position or if your baby remains in a breech position, your health care provider will make recommendations about the safest way to deliver your baby. This information is not intended to replace advice given to you by your health care provider. Make sure you discuss any questions you have with your health care provider. Document Revised: 03/22/2017 Document Reviewed: 01/03/2017 Elsevier Patient Education  2020 Elsevier Inc.  

## 2019-05-01 NOTE — Discharge Summary (Signed)
    Postpartum Discharge Summary     Patient Name: Sharon Garner DOB: 1990-08-29 MRN: [id number]  Date of admission: 05/01/2019 Delivering Provider: This patient has no babies on file.  Date of discharge: 05/01/2019  Admitting diagnosis: Breech presentation [O32.1XX0] Intrauterine pregnancy: [redacted]w[redacted]d     Secondary diagnosis:  Active Problems:   Supervision of other normal pregnancy, antepartum   Hx of cesarean section   GBS bacteriuria   Breech presentation  Additional problems: n/a     Discharge diagnosis: breech position                                       Hospital course: Pt admitted for external cephalic version. Version unsuccessful. She was monitored for 1 hr post version and maternal/fetal status stable. Discharged home in good condition. Will be scheduled for repeat c-section at 39 weeks. Discharge instructions given.  Physical exam  Vitals:   05/01/19 0700 05/01/19 1025  BP: 116/71 113/69  Pulse: 85 88  Resp: 18 18  Temp: 98.3 F (36.8 C) 98.2 F (36.8 C)  SpO2: 100% 100%  Weight: 79.4 kg   Height: 5\' 1"  (1.549 m)    General: alert, cooperative and no distress Lochia: appropriate Uterine Fundus: firm DVT Evaluation: No evidence of DVT seen on physical exam. Labs: Lab Results  Component Value Date   WBC 9.7 04/15/2019   HGB 11.8 (L) 04/15/2019   HCT 35.8 (L) 04/15/2019   MCV 84.4 04/15/2019   PLT 317 04/15/2019   CMP Latest Ref Rng & Units 04/15/2019  Glucose 70 - 99 mg/dL 98  BUN 6 - 20 mg/dL 5(L)  Creatinine 04/17/2019 - 1.00 mg/dL 4.17  Sodium 4.08 - 144 mmol/L 135  Potassium 3.5 - 5.1 mmol/L 3.7  Chloride 98 - 111 mmol/L 106  CO2 22 - 32 mmol/L 18(L)  Calcium 8.9 - 10.3 mg/dL 8.9  Total Protein 6.5 - 8.1 g/dL -  Total Bilirubin 0.3 - 1.2 mg/dL -  Alkaline Phos 38 - 818 U/L -  AST 15 - 41 U/L -  ALT 0 - 44 U/L -   After visit meds:  Allergies as of 05/01/2019   No Known Allergies     Medication List    STOP taking these medications    acetaminophen 500 MG tablet Commonly known as: TYLENOL   AMBULATORY NON FORMULARY MEDICATION   Doxylamine-Pyridoxine 10-10 MG Tbec   famotidine 20 MG tablet Commonly known as: PEPCID   metoCLOPramide 10 MG tablet Commonly known as: REGLAN   multivitamin-prenatal 27-0.8 MG Tabs tablet   ondansetron 4 MG disintegrating tablet Commonly known as: ZOFRAN-ODT       Diet: routine diet  Activity: As tolerated.  Follow up Appt: Future Appointments  Date Time Provider Department Center  05/07/2019  2:15 PM 05/09/2019, DO [facility] None     05/01/2019 06/29/2019, MD

## 2019-05-01 NOTE — H&P (Signed)
Obstetric History and Physical  Sharon Garner is a 29 y.o. [phone] with IUP at 69w4dpresenting for external cephalic version. Denies complaints, doing well with pregnancy.  Prenatal Course Source of Care: [facility] with onset of care at 14 weeks Pregnancy complications or risks: Patient Active Problem List   Diagnosis Date Noted  . GBS bacteriuria 02/15/2019  . Cramping affecting pregnancy, antepartum 01/27/2019  . Supervision of other normal pregnancy, antepartum 11/18/2018  . Hx of cesarean section 11/18/2018  . Family history of mental retardation 05/16/2012  . Migraine headache without aura 11/09/2011   Prenatal labs and studies: ABO, Rh: A/Positive/-- (07/28 1148) Antibody: Negative (07/28 1148) Rubella: 1.67 (07/28 1148) RPR: Non Reactive (10/06 0953)  HBsAg: Negative (07/28 1148)  HIV: Non Reactive (10/06 0953)  GBS: positive 2 hr GTT:  wnl Genetic screening normal Anatomy [place]  Medical History:  Past Medical History:  Diagnosis Date  . Migraine   . Ovarian cyst     Past Surgical History:  Procedure Laterality Date  . CESAREAN SECTION N/A 10/17/2012   Procedure: CESAREAN SECTION;  Surgeon: UOsborne Oman MD;  [facility];  Service: Obstetrics;  Laterality: N/A;    OB History  Gravida Para Term Preterm AB Living  _0 SAB TAB Ectopic Multiple Live Births  2       2    # Outcome Date GA Lbr Len/2nd Weight Sex Delivery Anes PTL Lv  5 Current           4 SAB 2020          3 Term 10/17/12 [id number] g F CS-LTranv Spinal  LIV  2 Term 2010    F Vag-Spont   LIV  1 SAB             Social History   Socioeconomic History  . Marital status: Married    Spouse name: Not on file  . Number of children: Not on file  . Years of education: Not on file  . Highest education level: Not on file  Occupational History  . Not on file  Tobacco Use  . Smoking status: Never Smoker  . Smokeless tobacco: Never Used  Substance and Sexual  Activity  . Alcohol use: No  . Drug use: No  . Sexual activity: Yes  Other Topics Concern  . Not on file  Social History Narrative  . Not on file   Social Determinants of Health   Financial Resource Strain:   . Difficulty of Paying Living Expenses: Not on file  Food Insecurity:   . Worried About RuCharity fundraisern the Last Year: Not on file  . Ran Out of Food in the Last Year: Not on file  Transportation Needs:   . Lack of Transportation (Medical): Not on file  . Lack of Transportation (Non-Medical): Not on file  Physical Activity:   . Days of Exercise per Week: Not on file  . Minutes of Exercise per Session: Not on file  Stress:   . Feeling of Stress : Not on file  Social Connections:   . Frequency of Communication with Friends and Family: Not on file  . Frequency of Social Gatherings with Friends and Family: Not on file  . Attends Religious Services: Not on file  . Active Member of Clubs or Organizations: Not on file  . Attends ClArchivisteetings: Not on file  . Marital Status: Not on  file    Family History  Problem Relation Age of Onset  . Cancer Mother        reproductive  . Other Neg Hx   . Alcohol abuse Neg Hx   . Arthritis Neg Hx   . Asthma Neg Hx   . Birth defects Neg Hx   . COPD Neg Hx   . Depression Neg Hx   . Diabetes Neg Hx   . Drug abuse Neg Hx   . Early death Neg Hx   . Hearing loss Neg Hx   . Heart disease Neg Hx   . Hyperlipidemia Neg Hx   . Hypertension Neg Hx   . Kidney disease Neg Hx   . Learning disabilities Neg Hx   . Mental illness Neg Hx   . Mental retardation Neg Hx   . Miscarriages / Stillbirths Neg Hx   . Stroke Neg Hx   . Vision loss Neg Hx     Medications Prior to Admission  Medication Sig Dispense Refill Last Dose  . acetaminophen (TYLENOL) 500 MG tablet Take 500 mg by mouth every 6 (six) hours as needed for mild pain, moderate pain, fever or headache.     . AMBULATORY NON FORMULARY MEDICATION 1 Device by Other  route once a week. Blood pressure cuff/medium DX: LROB Z34.90 1 kit 0   . Doxylamine-Pyridoxine 10-10 MG TBEC Take by mouth.     . famotidine (PEPCID) 20 MG tablet Take by mouth.     . metoCLOPramide (REGLAN) 10 MG tablet Take 1 tab q ac bid     . ondansetron (ZOFRAN-ODT) 4 MG disintegrating tablet Take 4 mg by mouth every 6 (six) hours as needed.     . Prenatal Vit-Fe Fumarate-FA (MULTIVITAMIN-PRENATAL) 27-0.8 MG TABS tablet Take 1 tablet by mouth daily at 12 noon.       No Known Allergies  Review of Systems: Negative except for what is mentioned in HPI.  Physical Exam: BP 116/71   Pulse 85   Temp 98.3 F (36.8 C)   Resp 18   Ht 5' 1" (1.549 m)   Wt 79.4 kg   SpO2 100%   BMI 33.07 kg/m  CONSTITUTIONAL: Well-developed, well-nourished female in no acute distress.  HENT:  Normocephalic, atraumatic, External right and left ear normal. Oropharynx is clear and moist EYES: Conjunctivae and EOM are normal. Pupils are equal, round, and reactive to light. No scleral icterus.  NECK: Normal range of motion, supple, no masses SKIN: Skin is warm and dry. No rash noted. Not diaphoretic. No erythema. No pallor. NEUROLOGIC: Alert and oriented to person, place, and time. Normal reflexes, muscle tone coordination. No cranial nerve deficit noted. PSYCHIATRIC: Normal mood and affect. Normal behavior. Normal judgment and thought content. CARDIOVASCULAR: Normal heart rate noted, regular rhythm RESPIRATORY: Effort and breath sounds normal, no problems with respiration noted ABDOMEN: Soft, nontender, nondistended, gravid. MUSCULOSKELETAL: Normal range of motion. No edema and no tenderness. 2+ distal pulses.  Cervical Exam: deferred   Presentation: breech complete FHT:  Baseline rate 130 bpm   Variability moderate  Accelerations present   Decelerations none Contractions: none   Pertinent Labs/Studies:   No results found for this or any previous visit (from the past 24 hour(s)).  Assessment  : Sharon Garner is a 29 y.o. [phone] at 70w4dwho is here for external cephalic version. H/o SVD, then 1LTCS for breech. Desires TOLAC.  Reviewed risks of external cephalic version including risk of pain, bleeding, abruption, fetal intolerance,  injury to fetus, failure for turn fetus. Reviewed risk of need for urgent primary c-section for fetal intolerance, including risks of c-section of infection, bleeding, damage to surrounding tissue and organs. Reviewed that if she remains breech, would be scheduled for repeat c-section. She verbalizes understanding and desires to proceed, consent signed.  Reviewed risks/benefits of TOLAC versus RCS in detail. Patient counseled regarding potential vaginal delivery, chance of success, future implications, possible uterine rupture and need for urgent/emergent repeat cesarean. Counseled regarding potential need for repeat c-section for reasons unrelated to first c-section. Counseled regarding scheduled repeat cesarean including risks of bleeding, infection, damage to surrounding tissue, abnormal placentation, implications for future pregnancies. All questions answered.  Patient desires TOLAC, will proceed with external cephalic version.  Remain NPO For version   K. Arvilla Meres, M.D. Attending Center for [employer] (Faculty Practice)  05/01/2019, 8:43 AM

## 2019-05-06 ENCOUNTER — Encounter (HOSPITAL_COMMUNITY): Payer: Self-pay

## 2019-05-06 NOTE — Patient Instructions (Signed)
Sharon Garner  05/06/2019   Your procedure is scheduled on:  05/11/2019  Arrive at 0815 at Entrance C on [address] at [facility]  and [employer]. You are invited to use the FREE valet parking or use the Visitor's parking deck.  Pick up the phone at the desk and dial [phone].  Call this number if you have problems the morning of surgery: [phone]  Remember:   Do not eat food:(After Midnight) Desps de medianoche.  Do not drink clear liquids: (After Midnight) Desps de medianoche.  Take these medicines the morning of surgery with A SIP OF WATER:  none   Do not wear jewelry, make-up or nail polish.  Do not wear lotions, powders, or perfumes. Do not wear deodorant.  Do not shave 48 hours prior to surgery.  Do not bring valuables to the hospital.  [facility] is not   responsible for any belongings or valuables brought to the hospital.  Contacts, dentures or bridgework may not be worn into surgery.  Leave suitcase in the car. After surgery it may be brought to your room.  For patients admitted to the hospital, checkout time is 11:00 AM the day of              discharge.      Please read over the following fact sheets that you were given:     Preparing for Surgery

## 2019-05-07 ENCOUNTER — Telehealth (INDEPENDENT_AMBULATORY_CARE_PROVIDER_SITE_OTHER): Payer: Medicaid Other | Admitting: Family Medicine

## 2019-05-07 DIAGNOSIS — R8271 Bacteriuria: Secondary | ICD-10-CM

## 2019-05-07 DIAGNOSIS — Z3A38 38 weeks gestation of pregnancy: Secondary | ICD-10-CM | POA: Diagnosis not present

## 2019-05-07 DIAGNOSIS — Z98891 History of uterine scar from previous surgery: Secondary | ICD-10-CM

## 2019-05-07 DIAGNOSIS — O329XX0 Maternal care for malpresentation of fetus, unspecified, not applicable or unspecified: Secondary | ICD-10-CM | POA: Diagnosis not present

## 2019-05-07 DIAGNOSIS — Z3480 Encounter for supervision of other normal pregnancy, unspecified trimester: Secondary | ICD-10-CM

## 2019-05-07 NOTE — Progress Notes (Signed)
   TELEHEALTH VIRTUAL OBSTETRICS VISIT ENCOUNTER NOTE  I connected with Sharon Garner on 05/07/19 at  2:15 PM EST by telephone at home and verified that I am speaking with the correct person using two identifiers.   I discussed the limitations, risks, security and privacy concerns of performing an evaluation and management service by telephone and the availability of in person appointments. I also discussed with the patient that there may be a patient responsible charge related to this service. The patient expressed understanding and agreed to proceed.  Subjective:  Sharon Garner is a 29 y.o. [phone] at [redacted]w[redacted]d being followed for ongoing prenatal care.  She is currently monitored for the following issues for this low-risk pregnancy and has Family history of mental retardation; Supervision of other normal pregnancy, antepartum; Hx of cesarean section; Migraine headache without aura; Cramping affecting pregnancy, antepartum; GBS bacteriuria; and Breech presentation on their problem list.  Patient reports good fetal movement. Reports fetal movement. Denies any contractions, bleeding or leaking of fluid.   The following portions of the patient's history were reviewed and updated as appropriate: allergies, current medications, past family history, past medical history, past social history, past surgical history and problem list.   Objective:   General:  Alert, oriented and cooperative.   Mental Status: Normal mood and affect perceived. Normal judgment and thought content.  Rest of physical exam deferred due to type of encounter  Assessment and Plan:  Pregnancy: [id number] at [redacted]w[redacted]d 1. Supervision of other normal pregnancy, antepartum Good fetal movement. Labor precautions reviewed.  2. GBS bacteriuria  3. Hx of cesarean section  4. Malposition of fetus, single or unspecified fetus Scheduled for RLTCS on monday  Term labor symptoms and general obstetric precautions including but  not limited to vaginal bleeding, contractions, leaking of fluid and fetal movement were reviewed in detail with the patient.  I discussed the assessment and treatment plan with the patient. The patient was provided an opportunity to ask questions and all were answered. The patient agreed with the plan and demonstrated an understanding of the instructions. The patient was advised to call back or seek an in-person office evaluation/go to [facility]U at [facility] for any urgent or concerning symptoms. Please refer to After Visit Summary for other counseling recommendations.   I provided 11 minutes of non-face-to-face time during this encounter.  No follow-ups on file.  Future Appointments  Date Time Provider Department Center  05/09/2019  8:20 AM [facility] 1 [facility] None    Levie Heritage, DO [facility] [employer], [facility]

## 2019-05-09 ENCOUNTER — Other Ambulatory Visit: Payer: Self-pay

## 2019-05-09 ENCOUNTER — Other Ambulatory Visit (HOSPITAL_COMMUNITY)
Admission: RE | Admit: 2019-05-09 | Discharge: 2019-05-09 | Disposition: A | Discharge status: Home / Self Care | Payer: Medicaid Other | Source: Ambulatory Visit | Attending: Obstetrics | Admitting: Obstetrics

## 2019-05-09 DIAGNOSIS — Z01812 Encounter for preprocedural laboratory examination: Secondary | ICD-10-CM | POA: Diagnosis present

## 2019-05-09 DIAGNOSIS — O321XX0 Maternal care for breech presentation, not applicable or unspecified: Secondary | ICD-10-CM | POA: Insufficient documentation

## 2019-05-09 DIAGNOSIS — Z20822 Contact with and (suspected) exposure to covid-19: Secondary | ICD-10-CM | POA: Insufficient documentation

## 2019-05-09 LAB — RESPIRATORY PANEL BY RT PCR (FLU A&B, COVID)
Influenza A by PCR: NEGATIVE
Influenza B by PCR: NEGATIVE
SARS Coronavirus 2 by RT PCR: NEGATIVE

## 2019-05-09 LAB — TYPE AND SCREEN
ABO/RH(D): A POS
Antibody Screen: NEGATIVE

## 2019-05-09 LAB — CBC
HCT: 35.5 % — ABNORMAL LOW (ref 36.0–46.0)
Hemoglobin: 11.6 g/dL — ABNORMAL LOW (ref 12.0–15.0)
MCH: 26.3 pg (ref 26.0–34.0)
MCHC: 32.7 g/dL (ref 30.0–36.0)
MCV: 80.5 fL (ref 80.0–100.0)
Platelets: 350 10*3/uL (ref 150–400)
RBC: 4.41 MIL/uL (ref 3.87–5.11)
RDW: 13.5 % (ref 11.5–15.5)
WBC: 8.9 10*3/uL (ref 4.0–10.5)
nRBC: 0 % (ref 0.0–0.2)

## 2019-05-09 LAB — RPR: RPR Ser Ql: NONREACTIVE

## 2019-05-09 NOTE — MAU Note (Signed)
Pt here for PAT covid swab and lab draw. Denies symptoms. Swab collected. Pt verbalizes understanding to not remove blood bank bracelet.  

## 2019-05-11 ENCOUNTER — Encounter (HOSPITAL_COMMUNITY)
Admission: RE | Disposition: A | Discharge status: Home / Self Care | Payer: Self-pay | Source: Home / Self Care | Attending: Obstetrics and Gynecology

## 2019-05-11 ENCOUNTER — Encounter (HOSPITAL_COMMUNITY): Payer: Self-pay | Admitting: Obstetrics and Gynecology

## 2019-05-11 ENCOUNTER — Inpatient Hospital Stay (HOSPITAL_COMMUNITY)
Admission: RE | Admit: 2019-05-11 | Discharge: 2019-05-13 | DRG: 788 | Disposition: A | Discharge status: Home / Self Care | Payer: Medicaid Other | Attending: Obstetrics and Gynecology | Admitting: Obstetrics and Gynecology

## 2019-05-11 ENCOUNTER — Other Ambulatory Visit: Payer: Self-pay

## 2019-05-11 ENCOUNTER — Inpatient Hospital Stay (HOSPITAL_COMMUNITY): Payer: Medicaid Other | Admitting: Certified Registered Nurse Anesthetist

## 2019-05-11 DIAGNOSIS — Z3480 Encounter for supervision of other normal pregnancy, unspecified trimester: Secondary | ICD-10-CM

## 2019-05-11 DIAGNOSIS — O99824 Streptococcus B carrier state complicating childbirth: Secondary | ICD-10-CM | POA: Diagnosis not present

## 2019-05-11 DIAGNOSIS — O34211 Maternal care for low transverse scar from previous cesarean delivery: Principal | ICD-10-CM | POA: Diagnosis present

## 2019-05-11 DIAGNOSIS — Z30017 Encounter for initial prescription of implantable subdermal contraceptive: Secondary | ICD-10-CM

## 2019-05-11 DIAGNOSIS — O321XX0 Maternal care for breech presentation, not applicable or unspecified: Secondary | ICD-10-CM | POA: Diagnosis not present

## 2019-05-11 DIAGNOSIS — R8271 Bacteriuria: Secondary | ICD-10-CM | POA: Diagnosis present

## 2019-05-11 DIAGNOSIS — Z98891 History of uterine scar from previous surgery: Secondary | ICD-10-CM

## 2019-05-11 DIAGNOSIS — Z3A39 39 weeks gestation of pregnancy: Secondary | ICD-10-CM | POA: Diagnosis not present

## 2019-05-11 DIAGNOSIS — Z20822 Contact with and (suspected) exposure to covid-19: Secondary | ICD-10-CM | POA: Diagnosis not present

## 2019-05-11 SURGERY — Surgical Case
Anesthesia: Spinal

## 2019-05-11 MED ORDER — KETOROLAC TROMETHAMINE 30 MG/ML IJ SOLN
30.0000 mg | Freq: Four times a day (QID) | INTRAMUSCULAR | Status: AC
  Administered 2019-05-11 – 2019-05-12 (×3): 30 mg via INTRAVENOUS
  Filled 2019-05-11 (×3): qty 1

## 2019-05-11 MED ORDER — MEPERIDINE HCL 25 MG/ML IJ SOLN: 6.2500 mg | INTRAMUSCULAR | Status: DC | PRN

## 2019-05-11 MED ORDER — LACTATED RINGERS IV SOLN: INTRAVENOUS | Status: DC

## 2019-05-11 MED ORDER — DIPHENHYDRAMINE HCL 50 MG/ML IJ SOLN: 12.5000 mg | INTRAMUSCULAR | Status: DC | PRN

## 2019-05-11 MED ORDER — SIMETHICONE 80 MG PO CHEW
80.0000 mg | CHEWABLE_TABLET | ORAL | Status: DC
  Administered 2019-05-12 – 2019-05-13 (×2): 80 mg via ORAL
  Filled 2019-05-11 (×2): qty 1

## 2019-05-11 MED ORDER — SOD CITRATE-CITRIC ACID 500-334 MG/5ML PO SOLN
ORAL | Status: AC
  Filled 2019-05-11: qty 30

## 2019-05-11 MED ORDER — ENOXAPARIN SODIUM 40 MG/0.4ML ~~LOC~~ SOLN
40.0000 mg | SUBCUTANEOUS | Status: DC
  Administered 2019-05-12 – 2019-05-13 (×2): 40 mg via SUBCUTANEOUS
  Filled 2019-05-11 (×2): qty 0.4

## 2019-05-11 MED ORDER — SENNOSIDES-DOCUSATE SODIUM 8.6-50 MG PO TABS
2.0000 | ORAL_TABLET | ORAL | Status: DC
  Administered 2019-05-12 – 2019-05-13 (×2): 2 via ORAL
  Filled 2019-05-11 (×2): qty 2

## 2019-05-11 MED ORDER — OXYTOCIN 40 UNITS IN NORMAL SALINE INFUSION - SIMPLE MED
INTRAVENOUS | Status: AC
  Filled 2019-05-11: qty 1000

## 2019-05-11 MED ORDER — MORPHINE SULFATE (PF) 0.5 MG/ML IJ SOLN
INTRAMUSCULAR | Status: DC | PRN
  Administered 2019-05-11: .15 mg via EPIDURAL

## 2019-05-11 MED ORDER — PHENYLEPHRINE HCL-NACL 20-0.9 MG/250ML-% IV SOLN
INTRAVENOUS | Status: AC
  Filled 2019-05-11: qty 250

## 2019-05-11 MED ORDER — SIMETHICONE 80 MG PO CHEW: 80.0000 mg | CHEWABLE_TABLET | ORAL | Status: DC | PRN

## 2019-05-11 MED ORDER — BUPIVACAINE HCL (PF) 0.5 % IJ SOLN
INTRAMUSCULAR | Status: DC | PRN
  Administered 2019-05-11: 30 mL

## 2019-05-11 MED ORDER — KETOROLAC TROMETHAMINE 30 MG/ML IJ SOLN: 30.0000 mg | Freq: Four times a day (QID) | INTRAMUSCULAR | Status: AC | PRN

## 2019-05-11 MED ORDER — DIBUCAINE (PERIANAL) 1 % EX OINT: 1.0000 "application " | TOPICAL_OINTMENT | CUTANEOUS | Status: DC | PRN

## 2019-05-11 MED ORDER — OXYCODONE HCL 5 MG PO TABS
5.0000 mg | ORAL_TABLET | ORAL | Status: DC | PRN
  Administered 2019-05-11 – 2019-05-13 (×2): 5 mg via ORAL
  Filled 2019-05-11 (×2): qty 1

## 2019-05-11 MED ORDER — IBUPROFEN 800 MG PO TABS
800.0000 mg | ORAL_TABLET | Freq: Three times a day (TID) | ORAL | Status: DC
  Administered 2019-05-12 – 2019-05-13 (×3): 800 mg via ORAL
  Filled 2019-05-11 (×3): qty 1

## 2019-05-11 MED ORDER — ONDANSETRON HCL 4 MG/2ML IJ SOLN
INTRAMUSCULAR | Status: AC
  Filled 2019-05-11: qty 2

## 2019-05-11 MED ORDER — SOD CITRATE-CITRIC ACID 500-334 MG/5ML PO SOLN
30.0000 mL | ORAL | Status: AC
  Administered 2019-05-11: 30 mL via ORAL

## 2019-05-11 MED ORDER — OXYTOCIN 40 UNITS IN NORMAL SALINE INFUSION - SIMPLE MED: 2.5000 [IU]/h | INTRAVENOUS | Status: AC

## 2019-05-11 MED ORDER — SODIUM CHLORIDE 0.9% FLUSH: 3.0000 mL | INTRAVENOUS | Status: DC | PRN

## 2019-05-11 MED ORDER — FENTANYL CITRATE (PF) 100 MCG/2ML IJ SOLN
INTRAMUSCULAR | Status: AC
  Filled 2019-05-11: qty 2

## 2019-05-11 MED ORDER — BUPIVACAINE HCL (PF) 0.5 % IJ SOLN
INTRAMUSCULAR | Status: AC
  Filled 2019-05-11: qty 30

## 2019-05-11 MED ORDER — FENTANYL CITRATE (PF) 100 MCG/2ML IJ SOLN
25.0000 ug | INTRAMUSCULAR | Status: DC | PRN
  Administered 2019-05-11: 15 ug via INTRAVENOUS

## 2019-05-11 MED ORDER — ACETAMINOPHEN 10 MG/ML IV SOLN
1000.0000 mg | Freq: Once | INTRAVENOUS | Status: DC | PRN
  Administered 2019-05-11: 1000 mg via INTRAVENOUS

## 2019-05-11 MED ORDER — KETOROLAC TROMETHAMINE 30 MG/ML IJ SOLN
30.0000 mg | Freq: Four times a day (QID) | INTRAMUSCULAR | Status: AC | PRN
  Administered 2019-05-11: 30 mg via INTRAMUSCULAR

## 2019-05-11 MED ORDER — NALBUPHINE HCL 10 MG/ML IJ SOLN: 5.0000 mg | Freq: Once | INTRAMUSCULAR | Status: DC | PRN

## 2019-05-11 MED ORDER — ACETAMINOPHEN 500 MG PO TABS
1000.0000 mg | ORAL_TABLET | Freq: Four times a day (QID) | ORAL | Status: DC | PRN
  Administered 2019-05-11 – 2019-05-13 (×3): 1000 mg via ORAL
  Filled 2019-05-11 (×3): qty 2

## 2019-05-11 MED ORDER — ACETAMINOPHEN 10 MG/ML IV SOLN
INTRAVENOUS | Status: AC
  Filled 2019-05-11: qty 100

## 2019-05-11 MED ORDER — NALBUPHINE HCL 10 MG/ML IJ SOLN: 5.0000 mg | INTRAMUSCULAR | Status: DC | PRN

## 2019-05-11 MED ORDER — OXYCODONE HCL 5 MG PO TABS: 10.0000 mg | ORAL_TABLET | ORAL | Status: DC | PRN

## 2019-05-11 MED ORDER — SODIUM CHLORIDE 0.9 % IR SOLN
Status: DC | PRN
  Administered 2019-05-11: 1

## 2019-05-11 MED ORDER — SCOPOLAMINE 1 MG/3DAYS TD PT72
1.0000 | MEDICATED_PATCH | Freq: Once | TRANSDERMAL | Status: DC
  Administered 2019-05-11: 1.5 mg via TRANSDERMAL

## 2019-05-11 MED ORDER — PHENYLEPHRINE HCL-NACL 10-0.9 MG/250ML-% IV SOLN
INTRAVENOUS | Status: DC | PRN
  Administered 2019-05-11: 60 ug/min via INTRAVENOUS

## 2019-05-11 MED ORDER — PRENATAL MULTIVITAMIN CH
1.0000 | ORAL_TABLET | Freq: Every day | ORAL | Status: DC
  Administered 2019-05-11 – 2019-05-12 (×2): 1 via ORAL
  Filled 2019-05-11 (×2): qty 1

## 2019-05-11 MED ORDER — ACETAMINOPHEN 325 MG PO TABS: 325.0000 mg | ORAL_TABLET | Freq: Once | ORAL | Status: DC | PRN

## 2019-05-11 MED ORDER — CEFAZOLIN SODIUM-DEXTROSE 2-4 GM/100ML-% IV SOLN
INTRAVENOUS | Status: AC
  Filled 2019-05-11: qty 100

## 2019-05-11 MED ORDER — WITCH HAZEL-GLYCERIN EX PADS: 1.0000 "application " | MEDICATED_PAD | CUTANEOUS | Status: DC | PRN

## 2019-05-11 MED ORDER — DIPHENHYDRAMINE HCL 25 MG PO CAPS
25.0000 mg | ORAL_CAPSULE | Freq: Four times a day (QID) | ORAL | Status: DC | PRN
  Administered 2019-05-11: 25 mg via ORAL
  Filled 2019-05-11 (×2): qty 1

## 2019-05-11 MED ORDER — ACETAMINOPHEN 160 MG/5ML PO SOLN: 325.0000 mg | Freq: Once | ORAL | Status: DC | PRN

## 2019-05-11 MED ORDER — OXYTOCIN 40 UNITS IN NORMAL SALINE INFUSION - SIMPLE MED
INTRAVENOUS | Status: DC | PRN
  Administered 2019-05-11: 40 mL via INTRAVENOUS

## 2019-05-11 MED ORDER — SCOPOLAMINE 1 MG/3DAYS TD PT72
MEDICATED_PATCH | TRANSDERMAL | Status: AC
  Filled 2019-05-11: qty 1

## 2019-05-11 MED ORDER — COCONUT OIL OIL: 1.0000 "application " | TOPICAL_OIL | Status: DC | PRN

## 2019-05-11 MED ORDER — SIMETHICONE 80 MG PO CHEW
80.0000 mg | CHEWABLE_TABLET | Freq: Three times a day (TID) | ORAL | Status: DC
  Administered 2019-05-11 – 2019-05-13 (×5): 80 mg via ORAL
  Filled 2019-05-11 (×5): qty 1

## 2019-05-11 MED ORDER — MORPHINE SULFATE (PF) 0.5 MG/ML IJ SOLN
INTRAMUSCULAR | Status: AC
  Filled 2019-05-11: qty 10

## 2019-05-11 MED ORDER — MENTHOL 3 MG MT LOZG: 1.0000 | LOZENGE | OROMUCOSAL | Status: DC | PRN

## 2019-05-11 MED ORDER — ONDANSETRON HCL 4 MG/2ML IJ SOLN
4.0000 mg | Freq: Three times a day (TID) | INTRAMUSCULAR | Status: DC | PRN
  Administered 2019-05-11: 4 mg via INTRAVENOUS

## 2019-05-11 MED ORDER — PROMETHAZINE HCL 25 MG/ML IJ SOLN: 6.2500 mg | INTRAMUSCULAR | Status: DC | PRN

## 2019-05-11 MED ORDER — NALOXONE HCL 0.4 MG/ML IJ SOLN: 0.4000 mg | INTRAMUSCULAR | Status: DC | PRN

## 2019-05-11 MED ORDER — CEFAZOLIN SODIUM-DEXTROSE 2-4 GM/100ML-% IV SOLN: 2.0000 g | INTRAVENOUS | Status: DC

## 2019-05-11 MED ORDER — KETOROLAC TROMETHAMINE 30 MG/ML IJ SOLN
INTRAMUSCULAR | Status: AC
  Filled 2019-05-11: qty 1

## 2019-05-11 MED ORDER — NALOXONE HCL 4 MG/10ML IJ SOLN
1.0000 ug/kg/h | INTRAVENOUS | Status: DC | PRN
  Filled 2019-05-11: qty 5

## 2019-05-11 MED ORDER — DIPHENHYDRAMINE HCL 25 MG PO CAPS: 25.0000 mg | ORAL_CAPSULE | ORAL | Status: DC | PRN

## 2019-05-11 MED ORDER — HYDROCODONE-ACETAMINOPHEN 5-325 MG PO TABS: 1.0000 | ORAL_TABLET | ORAL | Status: DC | PRN

## 2019-05-11 MED ORDER — SODIUM CHLORIDE 0.9 % IV SOLN: INTRAVENOUS | Status: DC | PRN

## 2019-05-11 SURGICAL SUPPLY — 33 items
BENZOIN TINCTURE PRP APPL 2/3 (GAUZE/BANDAGES/DRESSINGS) IMPLANT
CHLORAPREP W/TINT 26ML (MISCELLANEOUS) ×3 IMPLANT
CLAMP CORD UMBIL (MISCELLANEOUS) IMPLANT
CLOSURE WOUND 1/2 X4 (GAUZE/BANDAGES/DRESSINGS)
CLOTH BEACON ORANGE TIMEOUT ST (SAFETY) ×3 IMPLANT
DRSG OPSITE POSTOP 4X10 (GAUZE/BANDAGES/DRESSINGS) ×3 IMPLANT
ELECT REM PT RETURN 9FT ADLT (ELECTROSURGICAL) ×3
ELECTRODE REM PT RTRN 9FT ADLT (ELECTROSURGICAL) ×1 IMPLANT
EXTRACTOR VACUUM BELL STYLE (SUCTIONS) IMPLANT
GLOVE BIOGEL PI IND STRL 6.5 (GLOVE) ×1 IMPLANT
GLOVE BIOGEL PI IND STRL 7.0 (GLOVE) ×2 IMPLANT
GLOVE BIOGEL PI INDICATOR 6.5 (GLOVE) ×2
GLOVE BIOGEL PI INDICATOR 7.0 (GLOVE) ×4
GLOVE ORTHOPEDIC STR SZ6.5 (GLOVE) ×3 IMPLANT
GOWN STRL REUS W/TWL LRG LVL3 (GOWN DISPOSABLE) ×9 IMPLANT
KIT ABG SYR 3ML LUER SLIP (SYRINGE) IMPLANT
NEEDLE HYPO 22GX1.5 SAFETY (NEEDLE) ×3 IMPLANT
NEEDLE HYPO 25X1 1.5 SAFETY (NEEDLE) IMPLANT
NS IRRIG 1000ML POUR BTL (IV SOLUTION) ×3 IMPLANT
PACK C SECTION WH (CUSTOM PROCEDURE TRAY) ×3 IMPLANT
PAD OB MATERNITY 4.3X12.25 (PERSONAL CARE ITEMS) ×3 IMPLANT
PENCIL SMOKE EVAC W/HOLSTER (ELECTROSURGICAL) ×3 IMPLANT
STRIP CLOSURE SKIN 1/2X4 (GAUZE/BANDAGES/DRESSINGS) IMPLANT
SUT MON AB 4-0 PS1 27 (SUTURE) ×3 IMPLANT
SUT PLAIN 2 0 (SUTURE) ×2
SUT PLAIN ABS 2-0 CT1 27XMFL (SUTURE) ×1 IMPLANT
SUT VIC AB 0 CT1 36 (SUTURE) ×9 IMPLANT
SUT VIC AB 0 CTX 36 (SUTURE) ×2
SUT VIC AB 0 CTX36XBRD ANBCTRL (SUTURE) ×1 IMPLANT
SYR CONTROL 10ML LL (SYRINGE) ×3 IMPLANT
TOWEL OR 17X24 6PK STRL BLUE (TOWEL DISPOSABLE) ×3 IMPLANT
TRAY FOLEY W/BAG SLVR 14FR LF (SET/KITS/TRAYS/PACK) ×3 IMPLANT
WATER STERILE IRR 1000ML POUR (IV SOLUTION) ×3 IMPLANT

## 2019-05-11 NOTE — Discharge Summary (Signed)
Postpartum Discharge Summary      Patient Name: Sharon Garner DOB: [birth year] MRN: [id number]  Date of admission: 05/11/2019 Delivering Provider: Sloan Leiter   Date of discharge: 05/13/2019  Admitting diagnosis: History of C-section [Z98.891] Intrauterine pregnancy: [redacted]w[redacted]d    Secondary diagnosis:  Active Problems:   Supervision of other normal pregnancy, antepartum   Hx of cesarean section   GBS bacteriuria   Breech presentation  Additional problems: Allergic reaction to surgical drape adhesive     Discharge diagnosis: Term Pregnancy Delivered                                                                                                Post partum procedures: Nexplanon  Augmentation: n/a  Complications: None  Hospital course:  Sceduled C/S   29y.o. yo [id number] 366w0das admitted to the hospital 05/11/2019 for scheduled cesarean section with the following indication:Malpresentation, breech presentation and hx of one prior cesarean. Membrane Rupture Time/Date: 10:28 AM ,05/11/2019   Patient delivered a Viable infant.05/11/2019  Details of operation can be found in separate operative note.  Pateint had an uncomplicated postpartum course.  She is ambulating, tolerating a regular diet, passing flatus, and urinating well. Patient is discharged home in stable condition on  05/13/19        Delivery time: 10:28 AM    Magnesium Sulfate received: No BMZ received: No Rhophylac:N/A MMR:N/A Transfusion:No  Physical exam  Vitals:   05/12/19 1455 05/12/19 2112 05/13/19 0017 05/13/19 0600  BP: 110/70 116/66  118/79  Pulse: 65 73  76  Resp: 18   18  Temp: 98.9 F (37.2 C) 99 F (37.2 C) 98.3 F (36.8 C) 98.1 F (36.7 C)  TempSrc: Oral Oral Oral Oral  SpO2: 100% 100%  99%  Weight:      Height:       General: alert, cooperative and no distress Lochia: appropriate Uterine Fundus: firm Incision: Healing well with no significant drainage DVT Evaluation: No evidence  of DVT seen on physical exam. Labs: Lab Results  Component Value Date   WBC 10.5 05/12/2019   HGB 10.6 (L) 05/12/2019   HCT 31.9 (L) 05/12/2019   MCV 81.2 05/12/2019   PLT 295 05/12/2019   CMP Latest Ref Rng & Units 04/15/2019  Glucose 70 - 99 mg/dL 98  BUN 6 - 20 mg/dL 5(L)  Creatinine 0.44 - 1.00 mg/dL 0.54  Sodium 135 - 145 mmol/L 135  Potassium 3.5 - 5.1 mmol/L 3.7  Chloride 98 - 111 mmol/L 106  CO2 22 - 32 mmol/L 18(L)  Calcium 8.9 - 10.3 mg/dL 8.9  Total Protein 6.5 - 8.1 g/dL -  Total Bilirubin 0.3 - 1.2 mg/dL -  Alkaline Phos 38 - 126 U/L -  AST 15 - 41 U/L -  ALT 0 - 44 U/L -    Discharge instruction: per After Visit Summary and "Baby and Me Booklet".  After visit meds:  Allergies as of 05/13/2019   No Known Allergies     Medication List    TAKE these medications   acetaminophen 325 MG tablet  Commonly known as: TYLENOL Take 650 mg by mouth every 6 (six) hours as needed for mild pain or headache.   cetirizine 10 MG tablet Commonly known as: ZyrTEC Allergy Take 1 tablet (10 mg total) by mouth daily.   ibuprofen 800 MG tablet Commonly known as: ADVIL Take 1 tablet (800 mg total) by mouth every 8 (eight) hours.   oxyCODONE 5 MG immediate release tablet Commonly known as: Oxy IR/ROXICODONE Take 1 tablet (5 mg total) by mouth every 4 (four) hours as needed for moderate pain.       Diet: routine diet  Activity: Advance as tolerated. Pelvic rest for 6 weeks.   Outpatient follow up:6 weeks Follow up Appt: Future Appointments  Date Time Provider [place]  05/28/2019  2:45 PM Truett Mainland, DO [facility] None  06/18/2019 10:00 AM Nehemiah Settle Tanna Savoy, DO [facility] None   Follow up Visit:  Please schedule this patient for Postpartum visit in: 6 weeks with the following provider: Any provider Virtual For C/S patients schedule nurse incision check in weeks 2 weeks: yes Low risk pregnancy complicated by: n/a Delivery mode:  CS Anticipated Birth  Control:  Nexplanon PP Procedures needed: Incision check  Schedule Integrated BH visit: no   Newborn Data: Live born female  Birth Weight: 5 lb 14.8 oz (2688 g) APGAR: 8, 9  Newborn Delivery   Birth date/time: 05/11/2019 10:28:00 Delivery type: C-Section, Low Transverse Trial of labor: No C-section categorization: Repeat      Baby Feeding: Breast Disposition:home with mother   05/13/2019 Merilyn Baba, DO

## 2019-05-11 NOTE — Anesthesia Postprocedure Evaluation (Signed)
Anesthesia Post Note  Patient: Latish Toutant Cerros  Procedure(s) Performed: CESAREAN SECTION (N/A )     Patient location during evaluation: PACU Anesthesia Type: Spinal Level of consciousness: oriented and awake and alert Pain management: pain level controlled Vital Signs Assessment: post-procedure vital signs reviewed and stable Respiratory status: spontaneous breathing, respiratory function stable and patient connected to nasal cannula oxygen Cardiovascular status: blood pressure returned to baseline and stable Postop Assessment: no headache, no backache and no apparent nausea or vomiting Anesthetic complications: no    Last Vitals:  Vitals:   05/11/19 1227 05/11/19 1345  BP: 106/68 99/64  Pulse: 69 61  Resp: 18 16  Temp: (!) 36.3 C (!) 36.4 C  SpO2: 99% 99%    Last Pain:  Vitals:   05/11/19 1345  TempSrc: Oral  PainSc: 0-No pain   Pain Goal:                   Shelton Silvas

## 2019-05-11 NOTE — H&P (Signed)
Obstetric Preoperative History and Physical  Sharon Garner is a 29 y.o. [phone] with IUP at [redacted]w[redacted]d presenting for scheduled repeta cesarean section for breech position, s/p failed ECV.  No acute concerns.   Prenatal Course Source of Care: [facility] with onset of care at 14 weeks Pregnancy complications or risks: Patient Active Problem List   Diagnosis Date Noted  . Breech presentation 05/01/2019  . GBS bacteriuria 02/15/2019  . Cramping affecting pregnancy, antepartum 01/27/2019  . Supervision of other normal pregnancy, antepartum 11/18/2018  . Hx of cesarean section 11/18/2018  . Family history of mental retardation 05/16/2012  . Migraine headache without aura 11/09/2011   She plans to breastfeed She desires Nexplanon for postpartum contraception.   Prenatal labs and studies: ABO, Rh: --/--/A POS (01/16 0849) Antibody: NEG (01/16 0849) Rubella: 1.67 (07/28 1148) RPR: NON REACTIVE (01/16 0849)  HBsAg: Negative (07/28 1148)  HIV: Non Reactive (10/06 0953)  GBS: positive 2 hr Glucola  wnl Genetic screening normal Anatomy US normal  Prenatal Transfer Tool  Fetal Ultrasounds or other Referrals:  None Maternal Substance Abuse:  No Significant Maternal Medications:  None Significant Maternal Lab Results: Group B Strep positive  Past Medical History:  Diagnosis Date  . Migraine   . Ovarian cyst     Past Surgical History:  Procedure Laterality Date  . CESAREAN SECTION N/A 10/17/2012   Procedure: CESAREAN SECTION;  Surgeon: Tereso Newcomer, MD;  Location: [facility];  Service: Obstetrics;  Laterality: N/A;    OB History  Gravida Para Term Preterm AB Living  5 2 2   2 2   SAB TAB Ectopic Multiple Live Births  2       2    # Outcome Date GA Lbr Len/2nd Weight Sex Delivery Anes PTL Lv  5 Current           4 SAB 2020          3 Term 10/17/12 [redacted]w[redacted]d  3145 g F CS-LTranv Spinal  LIV  2 Term 2010    F Vag-Spont   LIV  1 SAB             Social History   Socioeconomic  History  . Marital status: Married    Spouse name: Not on file  . Number of children: Not on file  . Years of education: Not on file  . Highest education level: Not on file  Occupational History  . Not on file  Tobacco Use  . Smoking status: Never Smoker  . Smokeless tobacco: Never Used  Substance and Sexual Activity  . Alcohol use: No  . Drug use: No  . Sexual activity: Yes  Other Topics Concern  . Not on file  Social History Narrative  . Not on file   Social Determinants of Health   Financial Resource Strain:   . Difficulty of Paying Living Expenses: Not on file  Food Insecurity:   . Worried About 05-05-1981 in the Last Year: Not on file  . Ran Out of Food in the Last Year: Not on file  Transportation Needs:   . Lack of Transportation (Medical): Not on file  . Lack of Transportation (Non-Medical): Not on file  Physical Activity:   . Days of Exercise per Week: Not on file  . Minutes of Exercise per Session: Not on file  Stress:   . Feeling of Stress : Not on file  Social Connections:   . Frequency of Communication with Friends and  Family: Not on file  . Frequency of Social Gatherings with Friends and Family: Not on file  . Attends Religious Services: Not on file  . Active Member of Clubs or Organizations: Not on file  . Attends Banker Meetings: Not on file  . Marital Status: Not on file    Family History  Problem Relation Age of Onset  . Cancer Mother        reproductive  . Other Neg Hx   . Alcohol abuse Neg Hx   . Arthritis Neg Hx   . Asthma Neg Hx   . Birth defects Neg Hx   . COPD Neg Hx   . Depression Neg Hx   . Diabetes Neg Hx   . Drug abuse Neg Hx   . Early death Neg Hx   . Hearing loss Neg Hx   . Heart disease Neg Hx   . Hyperlipidemia Neg Hx   . Hypertension Neg Hx   . Kidney disease Neg Hx   . Learning disabilities Neg Hx   . Mental illness Neg Hx   . Mental retardation Neg Hx   . Miscarriages / Stillbirths Neg Hx   .  Stroke Neg Hx   . Vision loss Neg Hx     No medications prior to admission.    No Known Allergies  Review of Systems: Negative except for what is mentioned in HPI.  Physical Exam: Pulse 84   Temp 98.1 F (36.7 C) (Oral)   Resp 18   Ht 5\' 1"  (1.549 m)   Wt 79.4 kg   SpO2 100%   BMI 33.07 kg/m  FHR by Doppler: 128 bpm CONSTITUTIONAL: Well-developed, well-nourished female in no acute distress.  HENT:  Normocephalic, atraumatic, External right and left ear normal. Oropharynx is clear and moist EYES: Conjunctivae and EOM are normal. Pupils are equal, round, and reactive to light. No scleral icterus.  NECK: Normal range of motion, supple, no masses SKIN: Skin is warm and dry. No rash noted. Not diaphoretic. No erythema. No pallor. NEUROLGIC: Alert and oriented to person, place, and time. Normal reflexes, muscle tone coordination. No cranial nerve deficit noted. PSYCHIATRIC: Normal mood and affect. Normal behavior. Normal judgment and thought content. CARDIOVASCULAR: Normal heart rate noted, regular rhythm RESPIRATORY: Effort and breath sounds normal, no problems with respiration noted ABDOMEN: Soft, nontender, nondistended, gravid. Well-healed Pfannenstiel incision. PELVIC: Deferred MUSCULOSKELETAL: Normal range of motion. No edema and no tenderness. 2+ distal pulses.  Bedside : complete breech   Pertinent Labs/Studies:   Results for orders placed or performed during the hospital encounter of 05/09/19 (from the past 72 hour(s))  Respiratory Panel by RT PCR (Flu A&B, Covid) - Nasopharyngeal Swab     Status: None   Collection Time: 05/09/19  8:27 AM   Specimen: Nasopharyngeal Swab  Result Value Ref Range   SARS Coronavirus 2 by RT PCR NEGATIVE NEGATIVE    Comment: (NOTE) SARS-CoV-2 target nucleic acids are NOT DETECTED. The SARS-CoV-2 RNA is generally detectable in upper respiratoy specimens during the acute phase of infection. The lowest concentration of SARS-CoV-2 viral  copies this assay can detect is 131 copies/mL. A negative result does not preclude SARS-Cov-2 infection and should not be used as the sole basis for treatment or other patient management decisions. A negative result may occur with  improper specimen collection/handling, submission of specimen other than nasopharyngeal swab, presence of viral mutation(s) within the areas targeted by this assay, and inadequate number of viral copies (<131 copies/mL).  A negative result must be combined with clinical observations, patient history, and epidemiological information. The expected result is Negative. Fact Sheet for Patients:  PinkCheek.be Fact Sheet for Healthcare Providers:  GravelBags.it This test is not yet ap proved or cleared by the [place] FDA and  has been authorized for detection and/or diagnosis of SARS-CoV-2 by FDA under an Emergency Use Authorization (EUA). This EUA will remain  in effect (meaning this test can be used) for the duration of the COVID-19 declaration under Section 564(b)(1) of the Act, 21 U.S.C. section 360bbb-3(b)(1), unless the authorization is terminated or revoked sooner.    Influenza A by PCR NEGATIVE NEGATIVE   Influenza B by PCR NEGATIVE NEGATIVE    Comment: (NOTE) The Xpert Xpress SARS-CoV-2/FLU/RSV assay is intended as an aid in  the diagnosis of influenza from Nasopharyngeal swab specimens and  should not be used as a sole basis for treatment. Nasal washings and  aspirates are unacceptable for Xpert Xpress SARS-CoV-2/FLU/RSV  testing. Fact Sheet for Patients: PinkCheek.be Fact Sheet for Healthcare Providers: GravelBags.it This test is not yet approved or cleared by the [place] FDA and  has been authorized for detection and/or diagnosis of SARS-CoV-2 by  FDA under an Emergency Use Authorization (EUA). This EUA will remain  in  effect (meaning this test can be used) for the duration of the  Covid-19 declaration under Section 564(b)(1) of the Act, 21  U.S.C. section 360bbb-3(b)(1), unless the authorization is  terminated or revoked. Performed at [facility], [place] [address] [place], [place] [address]   CBC     Status: Abnormal   Collection Time: 05/09/19  8:49 AM  Result Value Ref Range   WBC 8.9 4.0 - 10.5 K/uL   RBC 4.41 3.87 - 5.11 MIL/uL   Hemoglobin 11.6 (L) 12.0 - 15.0 g/dL   HCT 35.5 (L) 36.0 - 46.0 %   MCV 80.5 80.0 - 100.0 fL   MCH 26.3 26.0 - 34.0 pg   MCHC 32.7 30.0 - 36.0 g/dL   RDW 13.5 11.5 - 15.5 %   Platelets 350 150 - 400 K/uL   nRBC 0.0 0.0 - 0.2 %    Comment: Performed at [facility], [place] [address], [place], [place] [address]  RPR     Status: None   Collection Time: 05/09/19  8:49 AM  Result Value Ref Range   RPR Ser Ql NON REACTIVE NON REACTIVE    Comment: Performed at [facility], [place] [address], [place], [place] [address]  Type and screen [place]     Status: None   Collection Time: 05/09/19  8:49 AM  Result Value Ref Range   ABO/RH(D) A POS    Antibody Screen NEG    Sample Expiration      05/12/2019,2359 Performed at [facility], [place] [address], [place], [place] [address]     Assessment and Plan :Sharon Garner is a 29 y.o. [id number] at [redacted]w[redacted]d being admitted for scheduled repeat cesarean section for breech position, confirmed again today on bedside US. No acute issues today. Plans for post partum Nexplanon.   The risks of cesarean section were discussed with the patient; including but not limited to: infection which may require antibiotics; bleeding which may require transfusion or re-operation; injury to bowel, bladder, ureters or other surrounding organs; need for additional procedures including hysterectomy in the event of a life-threatening hemorrhage; placental abnormalities wth subsequent pregnancies, risk of needing  c-sections in future pregnancies, incisional problems, thromboembolic phenomenon and other postoperative/anesthesia complications. Answered all questions. The patient verbalized understanding of the plan, giving informed consent for the procedure. She is agreeable to blood transfusion in the event of emergency.  Patient has been NPO since 12 am, she will remain NPO for procedure Anesthesia and OR aware Preoperative prophylactic antibiotics and SCDs ordered on call to the OR  To OR when ready   K. Therese Sarah, M.D. Attending Obstetrician & Gynecologist, [facility] for [employer], [facility]

## 2019-05-11 NOTE — Op Note (Addendum)
Sharon Garner PROCEDURE DATE: 05/11/2019  PREOPERATIVE DIAGNOSES: Intrauterine pregnancy at [redacted]w[redacted]d weeks gestation; malpresentation: breech , history of one prior cesarean  POSTOPERATIVE DIAGNOSES: The same  PROCEDURE: Repeat Low Transverse Cesarean Section  SURGEON:  Baldemar Lenis, MD  ASSISTANT:  Venora Maples, MD/MPH  ANESTHESIOLOGY TEAM: Anesthesiologist: Shelton Silvas, MD CRNA: Shanon Payor, CRNA  INDICATIONS: Sharon Garner is a 29 y.o. [phone] at [redacted]w[redacted]d here for cesarean section secondary to the indications listed under preoperative diagnoses; please see preoperative note for further details.  The risks of cesarean section were discussed with the patient including but were not limited to: bleeding which may require transfusion or reoperation; infection which may require antibiotics; injury to bowel, bladder, ureters or other surrounding organs; injury to the fetus; need for additional procedures including hysterectomy in the event of a life-threatening hemorrhage; placental abnormalities wth subsequent pregnancies, incisional problems, thromboembolic phenomenon and other postoperative/anesthesia complications.  She consents to blood transfusion in the event of an emergency. The patient verbalized understanding of the plan, giving informed written consent for the procedure.    FINDINGS:  Viable female infant in frank breech presentation.  Apgars 8 and 9. Weight: 2688 grams. Clear amniotic fluid.  Intact placenta, three vessel cord.  Normal uterus, fallopian tubes and ovaries bilaterally.  ANESTHESIA: Spinal INTRAVENOUS FLUIDS: 2500 ml   ESTIMATED BLOOD LOSS: 275 ml URINE OUTPUT:  650 ml SPECIMENS: Placenta sent to L&D COMPLICATIONS: None immediate  PROCEDURE IN DETAIL:  The patient preoperatively received intravenous antibiotics and had sequential compression devices applied to her lower extremities.  She was then taken to the operating room where spinal  anesthesia was administered and was found to be adequate. She was then placed in a dorsal supine position with a leftward tilt. She was prepped and draped in a sterile manner.  A foley catheter was placed into her bladder with sterile technique and attached to constant gravity.  After a timeout was performed, a Pfannenstiel skin incision was made with scalpel over her preexisting scar and carried through to the underlying layer of fascia. The fascia was incised in the midline, and this incision was extended bilaterally using the Mayo scissors.  Kocher clamps were applied to the superior aspect of the fascial incision and the underlying rectus muscles were dissected off bluntly.  A similar process was carried out on the inferior aspect of the fascial incision. The rectus muscles were separated in the midline bluntly and the peritoneum was entered bluntly. The peritoneal incision was carefully extended bluntly laterally and caudad with good visualization of the bladder. The uterus appeared normal. The Alexis O-ring retractor was placed into the incision, taking care not to incorporate bowel or omentum. The bladder blade was inserted, and a bladder flap was developed. Attention was turned to the lower uterine segment where a low transverse hysterotomy was made with a scalpel and extended bilaterally bluntly.  The infant was delivered from frank breech position via the usual maneuvers, nose and mouth were bulb suctioned. Infant with poor tone and cord was clamped and cut and infant was then handed over to the waiting neonatology team. Uterine massage was then performed, and the placenta delivered intact with a three-vessel cord. The uterus was then cleared of clots and debris.  The hysterotomy was closed with 0 Vicryl in a running locked fashion, and an imbricating layer was also placed with 0 Vicryl. Figure-of-eight 0 Vicryl serosal stitches were placed to help with hemostasis.  The fallopian tubes and  ovaries were  visualized bilaterally and normal appearing. The uterus was then gently replaced within the abdomen. The Alexis retractor was removed.  The pelvis was cleared of all clot and debris. Hemostasis was again confirmed on all surfaces. The peritoneum was re-approximated using 2-0 Vicryl suture. The fascia was then closed using 0 Vicryl in a running fashion.  The subcutaneous layer was irrigated, then reapproximated with 2-0 plain gut, and 30 ml of 0.5% Marcaine was injected subcutaneously around the incision.  The skin was closed with a 4-0 Monocryl subcuticular stitch. The patient tolerated the procedure well. Sponge, lap, instrument and needle counts were correct x 3.  She was taken to the recovery room in stable condition.    [place] [facility] Attending Supervision of [place] Fellow: Evaluation, management, and procedures were performed by the [facility] Fellow under my supervision and collaboration. I was scrubbed and present for all portions of this procedure. I agree with the documentation and plan.  Feliz Beam, M.D. Attending Center for [employer] (Faculty Practice)  05/11/2019 3:36 PM

## 2019-05-11 NOTE — Anesthesia Preprocedure Evaluation (Addendum)
Anesthesia Evaluation  Patient identified by MRN, date of birth, ID band Patient awake    Reviewed: Allergy & Precautions, NPO status , Patient's Chart, lab work & pertinent test results  Airway Mallampati: II       Dental no notable dental hx.    Pulmonary neg pulmonary ROS,    Pulmonary exam normal        Cardiovascular negative cardio ROS Normal cardiovascular exam     Neuro/Psych  Headaches,    GI/Hepatic negative GI ROS, Neg liver ROS,   Endo/Other    Renal/GU      Musculoskeletal   Abdominal   Peds  Hematology negative hematology ROS (+)   Anesthesia Other Findings   Reproductive/Obstetrics (+) Pregnancy                            Anesthesia Physical Anesthesia Plan  ASA: II  Anesthesia Plan: Spinal   Post-op Pain Management:    Induction:   PONV Risk Score and Plan: 3 and Ondansetron and Treatment may vary due to age or medical condition  Airway Management Planned: Natural Airway  Additional Equipment: None  Intra-op Plan:   Post-operative Plan:   Informed Consent: I have reviewed the patients History and Physical, chart, labs and discussed the procedure including the risks, benefits and alternatives for the proposed anesthesia with the patient or authorized representative who has indicated his/her understanding and acceptance.       Plan Discussed with: CRNA  Anesthesia Plan Comments: (Lab Results      Component                Value               Date                      WBC                      8.9                 05/09/2019                HGB                      11.6 (L)            05/09/2019                HCT                      35.5 (L)            05/09/2019                MCV                      80.5                05/09/2019                PLT                      350                 05/09/2019            Covid-19 Nucleic Acid Test Results Lab  Results  Component                Value               Date                      SARSCOV2NAA              NEGATIVE            05/09/2019                SARSCOV2NAA              NEGATIVE            04/29/2019                SARSCOV2NAA              NEGATIVE            03/11/2019           )       Anesthesia Quick Evaluation

## 2019-05-11 NOTE — Plan of Care (Signed)
  Problem: Pain Managment: Goal: General experience of comfort will improve Note: Patient beginning to have more pain, 6 out of 10. Patient already had toradol and tylenol in pacu in which she cannot have for another three hours. Patient has vicodin ordered. Requested oxycodone and tylenol prn. Dr. Clarisa Fling called, in which he approved changing orders and stated that patient could go ahead and be given oxycodone now. Earl Gala, Linda Hedges [place]

## 2019-05-11 NOTE — Progress Notes (Signed)
Called first call L&D for orders for a dressing change as honeycomb dressing was saturated. Dr. Sanjuana Mae came to patient room and changed dressing. New honeycomb and pressure dressing applied.

## 2019-05-11 NOTE — Transfer of Care (Signed)
Immediate Anesthesia Transfer of Care Note  Patient: Sharon Garner  Procedure(s) Performed: CESAREAN SECTION (N/A )  Patient Location: PACU  Anesthesia Type:Spinal  Level of Consciousness: awake, alert  and oriented  Airway & Oxygen Therapy: Patient Spontanous Breathing  Post-op Assessment: Report given to RN and Post -op Vital signs reviewed and stable  Post vital signs: Reviewed and stable  Last Vitals:  Vitals Value Taken Time  BP    Temp    Pulse    Resp    SpO2      Last Pain:  Vitals:   05/11/19 0825  TempSrc: Oral         Complications: No apparent anesthesia complications

## 2019-05-12 DIAGNOSIS — Z30017 Encounter for initial prescription of implantable subdermal contraceptive: Secondary | ICD-10-CM

## 2019-05-12 LAB — URINALYSIS, ROUTINE W REFLEX MICROSCOPIC
Bilirubin Urine: NEGATIVE
Glucose, UA: NEGATIVE mg/dL
Hgb urine dipstick: NEGATIVE
Ketones, ur: NEGATIVE mg/dL
Leukocytes,Ua: NEGATIVE
Nitrite: NEGATIVE
Protein, ur: NEGATIVE mg/dL
Specific Gravity, Urine: 1.005 (ref 1.005–1.030)
pH: 6 (ref 5.0–8.0)

## 2019-05-12 LAB — CBC
HCT: 31.9 % — ABNORMAL LOW (ref 36.0–46.0)
Hemoglobin: 10.6 g/dL — ABNORMAL LOW (ref 12.0–15.0)
MCH: 27 pg (ref 26.0–34.0)
MCHC: 33.2 g/dL (ref 30.0–36.0)
MCV: 81.2 fL (ref 80.0–100.0)
Platelets: 295 10*3/uL (ref 150–400)
RBC: 3.93 MIL/uL (ref 3.87–5.11)
RDW: 13.4 % (ref 11.5–15.5)
WBC: 10.5 10*3/uL (ref 4.0–10.5)
nRBC: 0 % (ref 0.0–0.2)

## 2019-05-12 LAB — BIRTH TISSUE RECOVERY COLLECTION (PLACENTA DONATION)

## 2019-05-12 MED ORDER — LIDOCAINE HCL 1 % IJ SOLN
0.0000 mL | Freq: Once | INTRAMUSCULAR | Status: AC | PRN
  Administered 2019-05-12: 3 mL via INTRADERMAL
  Filled 2019-05-12: qty 20

## 2019-05-12 MED ORDER — ETONOGESTREL 68 MG ~~LOC~~ IMPL
68.0000 mg | DRUG_IMPLANT | Freq: Once | SUBCUTANEOUS | Status: AC
  Administered 2019-05-12: 68 mg via SUBCUTANEOUS
  Filled 2019-05-12: qty 1

## 2019-05-12 NOTE — Progress Notes (Signed)
Upon shift assessment, patient reported burning pain of 5-6/10 during urination. Patient stated onset of pain was during the day but did not inform day shift RN. Patient's abdomen appears red and inflamed above incision. Patient reports infection which required surgical intervention from previous cesarean section. Called first-call L&D and reported off to on-call. New orders for a STAT urine collected by straight catheterization were placed. Collected urine and sent to lab. Will continue to monitor patient for symptoms of infection.

## 2019-05-12 NOTE — Lactation Note (Signed)
This note was copied from a baby's chart. Lactation Consultation Note  Patient Name: Sharon Garner OVFIE'P Date: 05/12/2019 Reason for consult: Initial assessment;Infant < 6lbs;Term  LC in to visit with P3 Mom of term baby born at 5 lbs 14.8 oz.  Baby is 27 hrs old and at 3% weight loss.    Baby has been to breast 8 times with last latch score of 9. Baby had had 6 bottle feedings, last feeding was 30 ml.   Mom had a poor experience with first 2 children.  One baby breast fed for 1 month and her milk just went away.  The other baby wouldn't latch well.  Mom states that she is happy with this baby and talked about how baby prefers breastfeeding over the bottle.    Mom encouraged to offer breast first, and to limit formula feedings to 15 ml today.  Mom knows she can call prn for assistance with positioning and latching.    Baby sleeping swaddled in crib, Mom planning to take a nap since baby latched and fed well and had 30 ml formula.    Mom encouraged to keep baby STS as much as possible.  If baby wasn't latching so well and breastfeeding, talked about adding pumping to support her milk supply.  Mom would prefer to focus on more breastfeeding.  If baby became sleepy or unable to latch, we would encouraged her to start pumping.   Lactation brochure left in room.  Mom aware of IP and OP lactation support available to her.    Maternal Data Formula Feeding for Exclusion: Yes Reason for exclusion: Mother's choice to formula and breast feed on admission Has patient been taught Hand Expression?: Yes Does the patient have breastfeeding experience prior to this delivery?: Yes  Feeding Feeding Type: Formula Nipple Type: Slow - flow  LATCH Score Latch: Grasps breast easily, tongue down, lips flanged, rhythmical sucking.  Audible Swallowing: A few with stimulation(sleepy; nursing during PKU)  Type of Nipple: Everted at rest and after stimulation  Comfort (Breast/Nipple): Soft /  non-tender  Hold (Positioning): No assistance needed to correctly position infant at breast.  LATCH Score: 9  Interventions Interventions: Breast feeding basics reviewed;Skin to skin;Breast massage;Hand express    Consult Status Consult Status: Follow-up Date: 05/13/19 Follow-up type: In-patient    Sharon Garner 05/12/2019, 2:05 PM

## 2019-05-12 NOTE — Progress Notes (Signed)
Subjective: Postpartum Day #1: Cesarean Delivery Patient reports tolerating PO and no problems voiding. Breast and bottlefeeding; desires Nexplanon to be placed inpatient. Denies dizziness with ambulation.  Objective: Vital signs in last 24 hours: Temp:  [97.5 F (36.4 C)-98.9 F (37.2 C)] 98.9 F (37.2 C) (01/19 1455) Pulse Rate:  [65-74] 65 (01/19 1455) Resp:  [16-18] 18 (01/19 1455) BP: (97-110)/(54-70) 110/70 (01/19 1455) SpO2:  [98 %-100 %] 100 % (01/19 1455)  Physical Exam:  General: alert, cooperative and no distress Lochia: appropriate Uterine Fundus: firm Incision: honeycomb intact DVT Evaluation: No evidence of DVT seen on physical exam.  Recent Labs    05/12/19 0437  HGB 10.6*  HCT 31.9*  Hgb preop: 11.6  Assessment/Plan: Status post Cesarean section. Doing well postoperatively.  Continue current care. Will place Nexplanon later today. Anticipate d/c 05/13/19.  Sharon Garner CNM 05/12/2019, 3:40 PM

## 2019-05-12 NOTE — Lactation Note (Signed)
This note was copied from a baby's chart. Lactation Consultation Note LC attempted to see mom, mom sleeping soundly.  Patient Name: Sharon Garner Date: 05/12/2019     Maternal Data    Feeding    LATCH Score                   Interventions    Lactation Tools Discussed/Used     Consult Status      Sharon Garner, [name] 05/12/2019, 2:21 AM

## 2019-05-12 NOTE — Procedures (Signed)
Post-Placental Nexplanon Insertion Procedure Note  Patient was identified. Informed consent was signed, signed copy in chart. A time-out was performed.    The insertion site was identified 8-10 cm (3-4 inches) from the medial epicondyle of the humerus and 3-5 cm (1.25-2 inches) posterior to (below) the sulcus (groove) between the biceps and triceps muscles of the patient's left arm and marked. The site was prepped and draped in the usual sterile fashion. Pt was prepped with alcohol swab and then injected with 3cc of 1% lidocaine. The site was prepped with betadine. Nexplanon removed form packaging,  Device confirmed in needle, then inserted full length of needle and withdrawn per handbook instructions. Provider and patient verified presence of the implant in the woman's arm by palpation. Pt insertion site was covered with steristrips/adhesive bandage and pressure bandage. There was minimal blood loss. Patient tolerated procedure well.  Patient was given post procedure instructions and Nexplanon user card with expiration date. Condoms were recommended for STI prevention. Patient was asked to keep the pressure dressing on for 24 hours to minimize bruising and keep the adhesive bandage on for 3-5 days. The patient verbalized understanding of the plan of care and agrees.   Expiration Date 05/11/2022   Lorri Frederick, DO, PGY-1 OBGYN Faculty Teaching Service

## 2019-05-13 MED ORDER — OXYCODONE HCL 5 MG PO TABS: 5.0000 mg | ORAL_TABLET | ORAL | 0 refills | Status: DC | PRN

## 2019-05-13 MED ORDER — DIPHENHYDRAMINE HCL 25 MG PO CAPS
50.0000 mg | ORAL_CAPSULE | Freq: Once | ORAL | Status: AC
  Administered 2019-05-13: 50 mg via ORAL

## 2019-05-13 MED ORDER — IBUPROFEN 800 MG PO TABS: 800.0000 mg | ORAL_TABLET | Freq: Three times a day (TID) | ORAL | 0 refills | Status: DC

## 2019-05-13 MED ORDER — CETIRIZINE HCL 10 MG PO TABS: 10.0000 mg | ORAL_TABLET | Freq: Every day | ORAL | 2 refills | Status: DC

## 2019-05-13 NOTE — Lactation Note (Signed)
This note was copied from a baby's chart. Lactation Consultation Note  Patient Name: Sharon Garner Date: 05/13/2019 Reason for consult: Follow-up assessment Baby is 47 hours old/7% weight loss.  Mom, is both breast and formula feeding by choice.  She states baby is latching and feeding well.  Stressed importance of breasts being emptied by baby or pump every 3 hours to establish and maintain a good milk supply.  Manual pump given with instructions.  Discussed milk coming to volume and the prevention and treatment of engorgement.  Reviewed outpatient services and encouraged to call prn.  Maternal Data    Feeding Feeding Type: Bottle Fed - Formula Nipple Type: Slow - flow  LATCH Score                   Interventions Interventions: Hand pump  Lactation Tools Discussed/Used     Consult Status Consult Status: Complete Follow-up type: Call as needed    Huston Foley 05/13/2019, 9:31 AM

## 2019-05-13 NOTE — Discharge Instructions (Signed)

## 2019-05-13 NOTE — Progress Notes (Signed)
Called to room to evaluate rash on patient's abdomen.  Patient complaining of hot rash over top of abdomen that is raised. She says it developed over the course of the day.  PE: Vitals stable, patient afebrile. Abdomen: Raised, erythematous, rash that is warm to touch and in a square pattern over the patient's abdomen. It extends above the umbilicus, in a similar shape to the where the adhesive of the surgical drape was. It does not extend into the groin. Dressing: some serosanguinous drainage. Fundus firm at U.  Most likely the rash is an allergic reaction to the adhesive from the surgical drape- less likely the chlorhexadine as it does not extend to her groin. 50 mg Benadryl ordered.  She is also complaining of dysuria/burning. US unremarkable- most likely irritation form foley catheter.  Marlowe Alt, DO OB Fellow, Faculty Practice 05/13/2019 1:41 AM

## 2019-05-15 ENCOUNTER — Other Ambulatory Visit: Payer: Self-pay

## 2019-05-15 ENCOUNTER — Inpatient Hospital Stay (HOSPITAL_COMMUNITY)
Admission: AD | Admit: 2019-05-15 | Discharge: 2019-05-15 | Disposition: A | Discharge status: Home / Self Care | Payer: Medicaid Other | Attending: Obstetrics & Gynecology | Admitting: Obstetrics & Gynecology

## 2019-05-15 DIAGNOSIS — B951 Streptococcus, group B, as the cause of diseases classified elsewhere: Secondary | ICD-10-CM | POA: Insufficient documentation

## 2019-05-15 DIAGNOSIS — Z3480 Encounter for supervision of other normal pregnancy, unspecified trimester: Secondary | ICD-10-CM

## 2019-05-15 DIAGNOSIS — O9883 Other maternal infectious and parasitic diseases complicating the puerperium: Secondary | ICD-10-CM | POA: Insufficient documentation

## 2019-05-15 DIAGNOSIS — Z79899 Other long term (current) drug therapy: Secondary | ICD-10-CM | POA: Diagnosis not present

## 2019-05-15 DIAGNOSIS — Z8049 Family history of malignant neoplasm of other genital organs: Secondary | ICD-10-CM | POA: Diagnosis not present

## 2019-05-15 DIAGNOSIS — O99820 Streptococcus B carrier state complicating pregnancy: Secondary | ICD-10-CM | POA: Diagnosis not present

## 2019-05-15 DIAGNOSIS — Z4889 Encounter for other specified surgical aftercare: Secondary | ICD-10-CM | POA: Diagnosis not present

## 2019-05-15 DIAGNOSIS — R8271 Bacteriuria: Secondary | ICD-10-CM

## 2019-05-15 DIAGNOSIS — Z98891 History of uterine scar from previous surgery: Secondary | ICD-10-CM

## 2019-05-15 LAB — CBC
HCT: 34.6 % — ABNORMAL LOW (ref 36.0–46.0)
Hemoglobin: 11.2 g/dL — ABNORMAL LOW (ref 12.0–15.0)
MCH: 26.5 pg (ref 26.0–34.0)
MCHC: 32.4 g/dL (ref 30.0–36.0)
MCV: 81.8 fL (ref 80.0–100.0)
Platelets: 376 10*3/uL (ref 150–400)
RBC: 4.23 MIL/uL (ref 3.87–5.11)
RDW: 14.5 % (ref 11.5–15.5)
WBC: 8.9 10*3/uL (ref 4.0–10.5)
nRBC: 0 % (ref 0.0–0.2)

## 2019-05-15 MED ORDER — ACETAMINOPHEN 500 MG PO TABS
1000.0000 mg | ORAL_TABLET | Freq: Once | ORAL | Status: AC
  Administered 2019-05-15: 1000 mg via ORAL
  Filled 2019-05-15: qty 2

## 2019-05-15 NOTE — MAU Provider Note (Signed)
History     CSN: [id number]  Arrival date and time: 05/15/19 3419   First Provider Initiated Contact with Patient 05/15/19 2019      Chief Complaint  Patient presents with  . Drainage from Incision   [name] is a 29 y.o. [phone] at ppartum s/p rC/S on 05/11/2019 who presents to MAU for bleeding from incisional site. Pt reports she was sent home from the hospital on Wednesday, and then today found that her dressing was "saturated with blood." Pt reports she first saw the dressing get some blood on it yesterday.  Onset: yesterday Location: C/S incision Duration: ~24hrs Character: bright red bleeding from C/S incision, pt reports when she took off the bandage the left side had dark "old blood" but the right side had bright red blood. Pt denies active bleeding once the dressing was removed and none in MAU. Aggravating/Associated: none/pain from incision with movement Relieving: none Treatment: oxycodone, ibuprofen Severity: 6/10  Pt denies VB, vaginal discharge/odor/itching. Pt denies N/V, abdominal pain, constipation, diarrhea, or urinary problems. Pt denies fever, chills, fatigue, sweating or changes in appetite. Pt denies SOB or chest pain. Pt denies dizziness, HA, light-headedness, weakness.  Problems this pregnancy include: breech. Allergies? NKDA Current medications/supplements? oxycodone (5pm), ibuprofen (5pm), Tylenol (not today) Pregnant/postpartum/breastfeeding? breastfeeding Prenatal care provider? [facility], next appt 05/28/2019 for incision check   OB History    Gravida  5   Para  3   Term  3   Preterm      AB  2   Living  3     SAB  2   TAB      Ectopic      Multiple  0   Live Births  3           Past Medical History:  Diagnosis Date  . Migraine   . Ovarian cyst     Past Surgical History:  Procedure Laterality Date  . CESAREAN SECTION N/A 10/17/2012   Procedure: CESAREAN SECTION;  Surgeon: Tereso Newcomer, MD;   Location: [facility];  Service: Obstetrics;  Laterality: N/A;  . CESAREAN SECTION N/A 05/11/2019   Procedure: CESAREAN SECTION;  Surgeon: Conan Bowens, MD;  Location: [facility];  Service: Obstetrics;  Laterality: N/A;    Family History  Problem Relation Age of Onset  . Cancer Mother        reproductive  . Other Neg Hx   . Alcohol abuse Neg Hx   . Arthritis Neg Hx   . Asthma Neg Hx   . Birth defects Neg Hx   . COPD Neg Hx   . Depression Neg Hx   . Diabetes Neg Hx   . Drug abuse Neg Hx   . Early death Neg Hx   . Hearing loss Neg Hx   . Heart disease Neg Hx   . Hyperlipidemia Neg Hx   . Hypertension Neg Hx   . Kidney disease Neg Hx   . Learning disabilities Neg Hx   . Mental illness Neg Hx   . Mental retardation Neg Hx   . Miscarriages / Stillbirths Neg Hx   . Stroke Neg Hx   . Vision loss Neg Hx     Social History   Tobacco Use  . Smoking status: Never Smoker  . Smokeless tobacco: Never Used  Substance Use Topics  . Alcohol use: No  . Drug use: No    Allergies: No Known Allergies  Medications Prior to Admission  Medication  Sig Dispense Refill Last Dose  . acetaminophen (TYLENOL) 325 MG tablet Take 650 mg by mouth every 6 (six) hours as needed for mild pain or headache.   05/14/2019 at 6pm  . ibuprofen (ADVIL) 800 MG tablet Take 1 tablet (800 mg total) by mouth every 8 (eight) hours. 30 tablet 0 05/15/2019 at 5pm  . oxyCODONE (OXY IR/ROXICODONE) 5 MG immediate release tablet Take 1 tablet (5 mg total) by mouth every 4 (four) hours as needed for moderate pain. 30 tablet 0 05/15/2019 at 5pm  . cetirizine (ZYRTEC ALLERGY) 10 MG tablet Take 1 tablet (10 mg total) by mouth daily. 30 tablet 2     Review of Systems  Constitutional: Negative for chills, diaphoresis, fatigue and fever.  Eyes: Negative for visual disturbance.  Respiratory: Negative for shortness of breath.   Cardiovascular: Negative for chest pain.  Gastrointestinal: Positive for abdominal pain. Negative for  constipation, diarrhea, nausea and vomiting.       Incisional site bleeding.  Genitourinary: Negative for dysuria, flank pain, frequency, pelvic pain, urgency, vaginal bleeding and vaginal discharge.  Neurological: Negative for dizziness, weakness, light-headedness and headaches.   Physical Exam   Blood pressure 118/75, pulse (!) 102, temperature 98.7 F (37.1 C), temperature source Oral, resp. rate 20, height 5\' 1"  (1.549 m), weight 78.3 kg, unknown if currently breastfeeding.  Patient Vitals for the past 24 hrs:  BP Temp Temp src Pulse Resp Height Weight  05/15/19 1926 118/75 98.7 F (37.1 C) Oral (!) 102 20 5\' 1"  (1.549 m) 78.3 kg   Physical Exam  Constitutional: She is oriented to person, place, and time. She appears well-developed and well-nourished. No distress.  HENT:  Head: Normocephalic and atraumatic.  Respiratory: Effort normal.  GI: Soft. She exhibits no distension and no mass. There is no abdominal tenderness. There is no rebound and no guarding.  Incision appears well-healed, well-approximated, without drainage, bleeding, redness, warmth, swelling or other s/sx of dehiscence or infection.  Neurological: She is alert and oriented to person, place, and time.  Skin: Skin is warm and dry. She is not diaphoretic.  Psychiatric: She has a normal mood and affect. Her behavior is normal. Judgment and thought content normal.   Results for orders placed or performed during the hospital encounter of 05/15/19 (from the past 24 hour(s))  CBC     Status: Abnormal   Collection Time: 05/15/19  8:35 PM  Result Value Ref Range   WBC 8.9 4.0 - 10.5 K/uL   RBC 4.23 3.87 - 5.11 MIL/uL   Hemoglobin 11.2 (L) 12.0 - 15.0 g/dL   HCT 05/17/19 (L) 05/17/19 - 77.8 %   MCV 81.8 80.0 - 100.0 fL   MCH 26.5 26.0 - 34.0 pg   MCHC 32.4 30.0 - 36.0 g/dL   RDW 24.2 35.3 - 61.4 %   Platelets 376 150 - 400 K/uL   nRBC 0.0 0.0 - 0.2 %   MAU Course  Procedures  MDM -bleeding from incision site with pain  with movement -no active bleeding or drainage noted by patient at home after removing dressing or in MAU -CBC: H/H (11.2/34.6), was 10.6/31.9 3d ago -Tylenol 1000mg  given, pt reports pain now 3/10 -consulted with Dr. 09-05-1996, pt OK to be discharged home without imaging -pt discharged to home in stable condition  Orders Placed This Encounter  Procedures  . CBC    Standing Status:   Standing    Number of Occurrences:   1  . Discharge patient  Order Specific Question:   Discharge disposition    Answer:   01-Home or Self Care [1]    Order Specific Question:   Discharge patient date    Answer:   05/15/2019   Meds ordered this encounter  Medications  . acetaminophen (TYLENOL) tablet 1,000 mg   Assessment and Plan   1. Encounter for postoperative wound check   2. GBS bacteriuria   3. Supervision of other normal pregnancy, antepartum   4. Hx of cesarean section   5. Postpartum state    Allergies as of 05/15/2019   No Known Allergies     Medication List    TAKE these medications   acetaminophen 325 MG tablet Commonly known as: TYLENOL Take 650 mg by mouth every 6 (six) hours as needed for mild pain or headache.   cetirizine 10 MG tablet Commonly known as: ZyrTEC Allergy Take 1 tablet (10 mg total) by mouth daily.   ibuprofen 800 MG tablet Commonly known as: ADVIL Take 1 tablet (800 mg total) by mouth every 8 (eight) hours.   oxyCODONE 5 MG immediate release tablet Commonly known as: Oxy IR/ROXICODONE Take 1 tablet (5 mg total) by mouth every 4 (four) hours as needed for moderate pain.      -discussed normal expectations for pain s/p C/S -discussed appropriate use of pain medication, pt encouraged to take Tylenol as prescribed -discussed s/sx of incision/hematoma and when to return to MAU -pt discharged to home in stable condition  Elmyra Ricks E Azlee Monforte 05/15/2019, 9:20 PM

## 2019-05-15 NOTE — MAU Note (Signed)
PT SAYS HE HAD C/S ON Monday 1-18.  WENT HOME ON WED. SAYS HER INCISION IS BLEEDING - SHE TOOK DSG OFF.    SAYS PAIN MEDS NOT WORKING- TOOK OXYCODONE AT 5PM.  TOOK IBUPROFEN 800MG  AT 5 PM.-  PAIN NOW 8.

## 2019-05-15 NOTE — Discharge Instructions (Signed)

## 2019-05-15 NOTE — MAU Note (Signed)
Pt reports that the bleeding started one hour after she was discharged, the pt says she was aware there would be some bleeding but she felt like it was a lot. She reports her mother removing the dressing today so the area could be cleaned because one side was full of bright red blood and the dressing has been leaking. There is no bleeding seen at this time. The pt states" she does not feel the medication is helping that much". Pt states she is taking medication as directed.

## 2019-05-28 ENCOUNTER — Other Ambulatory Visit: Payer: Self-pay

## 2019-05-28 ENCOUNTER — Ambulatory Visit (INDEPENDENT_AMBULATORY_CARE_PROVIDER_SITE_OTHER): Payer: Medicaid Other | Admitting: Family Medicine

## 2019-05-28 ENCOUNTER — Encounter: Payer: Self-pay | Admitting: Family Medicine

## 2019-05-28 VITALS — BP 120/64 | HR 71 | Wt 167.0 lb

## 2019-05-28 DIAGNOSIS — Z4889 Encounter for other specified surgical aftercare: Secondary | ICD-10-CM

## 2019-05-28 MED ORDER — FERROUS GLUCONATE 324 (38 FE) MG PO TABS: 324.0000 mg | ORAL_TABLET | Freq: Two times a day (BID) | ORAL | 3 refills | Status: DC

## 2019-05-28 NOTE — Progress Notes (Signed)
Patient is two weeks post op c-section. Patient is breastfeeding. Armandina Stammer RN

## 2019-05-28 NOTE — Progress Notes (Signed)
   Subjective:    Patient ID: Sharon Garner, female    DOB: 1990/10/05, 29 y.o.   MRN: [id number]  HPI Here for 2 week wound check after c/s. No problems with incision. Pain controlled.   Review of Systems     Objective:   Physical Exam Vitals reviewed.  Constitutional:      Appearance: Normal appearance.  Pulmonary:     Effort: Pulmonary effort is normal.     Breath sounds: Normal breath sounds.  Abdominal:     Comments: Incision clean, dry, intact.  Skin:    General: Skin is warm and dry.  Neurological:     Mental Status: She is alert.       Assessment & Plan:  1. Encounter for post surgical wound check F/u in 2 weeks for wound check.

## 2019-06-18 ENCOUNTER — Ambulatory Visit: Payer: Medicaid Other | Admitting: Family Medicine

## 2019-06-25 ENCOUNTER — Ambulatory Visit: Payer: Medicaid Other | Admitting: Family Medicine

## 2019-06-30 DIAGNOSIS — H5213 Myopia, bilateral: Secondary | ICD-10-CM | POA: Diagnosis not present

## 2019-06-30 DIAGNOSIS — H52223 Regular astigmatism, bilateral: Secondary | ICD-10-CM | POA: Diagnosis not present

## 2019-06-30 DIAGNOSIS — H11153 Pinguecula, bilateral: Secondary | ICD-10-CM | POA: Diagnosis not present

## 2019-07-07 ENCOUNTER — Ambulatory Visit: Payer: Medicaid Other | Admitting: Advanced Practice Midwife

## 2019-07-08 ENCOUNTER — Ambulatory Visit: Payer: Medicaid Other | Admitting: Obstetrics & Gynecology

## 2019-07-08 DIAGNOSIS — H5213 Myopia, bilateral: Secondary | ICD-10-CM | POA: Diagnosis not present

## 2019-08-03 DIAGNOSIS — K819 Cholecystitis, unspecified: Secondary | ICD-10-CM | POA: Insufficient documentation

## 2019-08-07 DIAGNOSIS — H5213 Myopia, bilateral: Secondary | ICD-10-CM | POA: Diagnosis not present

## 2019-08-12 ENCOUNTER — Encounter: Payer: Self-pay | Admitting: Obstetrics & Gynecology

## 2019-08-12 ENCOUNTER — Ambulatory Visit (INDEPENDENT_AMBULATORY_CARE_PROVIDER_SITE_OTHER): Payer: Medicaid Other | Admitting: Obstetrics & Gynecology

## 2019-08-12 ENCOUNTER — Other Ambulatory Visit: Payer: Self-pay

## 2019-08-12 VITALS — BP 127/75 | HR 95 | Ht 61.0 in | Wt 168.0 lb

## 2019-08-12 DIAGNOSIS — N921 Excessive and frequent menstruation with irregular cycle: Secondary | ICD-10-CM | POA: Diagnosis not present

## 2019-08-12 DIAGNOSIS — Z975 Presence of (intrauterine) contraceptive device: Secondary | ICD-10-CM | POA: Diagnosis not present

## 2019-08-12 MED ORDER — NORETHIN ACE-ETH ESTRAD-FE 1-20 MG-MCG(24) PO TABS: 1.0000 | ORAL_TABLET | Freq: Every day | ORAL | 1 refills | Status: DC

## 2019-08-12 NOTE — Progress Notes (Signed)
History:  29 y.o. [id number] here today with a request to remove her Nexplanon. She has had Nexplanon prev and it led to amenorrhea. With the current device, she has had bleeding since delivery/placement.   The following portions of the patient's history were reviewed and updated as appropriate: allergies, current medications, past family history, past medical history, past social history, past surgical history and problem list.  Review of Systems:  Pertinent items are noted in HPI.    Objective:  Physical Exam Blood pressure 127/75, pulse 95, height 5\' 1"  (1.549 m), weight 168 lb (76.2 kg), currently breastfeeding.  CONSTITUTIONAL: Well-developed, well-nourished female in no acute distress.  HENT:  Normocephalic, atraumatic EYES: Conjunctivae and EOM are normal. No scleral icterus.  NECK: Normal range of motion SKIN: Skin is warm and dry. No rash noted. Not diaphoretic.No pallor. NEUROLGIC: Alert and oriented to person, place, and time. Normal coordination.  Left arm: Nexplanon in place.  Pelvic: deferred.   Assessment & Plan:  Diagnoses and all orders for this visit:  Breakthrough bleeding on Nexplanon -     Norethindrone Acetate-Ethinyl Estrad-FE (LOESTRIN 24 FE) 1-20 MG-MCG(24) tablet; Take 1 tablet by mouth daily.  We reviewed her options of keeping the Nexplanon and adding EES or removing it. She elects a trial of OCPs.  f/u in 3 -6 months prn.  If the bleeding returns to normal, pt does not need to keep that appt.   Total face-to-face time with patient was 15 min.  Greater than 50% was spent in counseling and coordination of care with the patient.   Pang Robers L. Harraway-Smith, M.D., 

## 2019-08-12 NOTE — Patient Instructions (Signed)
Etonogestrel implant What is this medicine? ETONOGESTREL (et oh noe JES trel) is a contraceptive (birth control) device. It is used to prevent pregnancy. It can be used for up to 3 years. This medicine may be used for other purposes; ask your health care provider or pharmacist if you have questions. COMMON BRAND NAME(S): Implanon, Nexplanon What should I tell my health care provider before I take this medicine? They need to know if you have any of these conditions:  abnormal vaginal bleeding  blood vessel disease or blood clots  breast, cervical, endometrial, ovarian, liver, or uterine cancer  diabetes  gallbladder disease  heart disease or recent heart attack  high blood pressure  high cholesterol or triglycerides  kidney disease  liver disease  migraine headaches  seizures  stroke  tobacco smoker  an unusual or allergic reaction to etonogestrel, anesthetics or antiseptics, other medicines, foods, dyes, or preservatives  pregnant or trying to get pregnant  breast-feeding How should I use this medicine? This device is inserted just under the skin on the inner side of your upper arm by a health care professional. Talk to your pediatrician regarding the use of this medicine in children. Special care may be needed. Overdosage: If you think you have taken too much of this medicine contact a poison control center or emergency room at once. NOTE: This medicine is only for you. Do not share this medicine with others. What if I miss a dose? This does not apply. What may interact with this medicine? Do not take this medicine with any of the following medications:  amprenavir  fosamprenavir This medicine may also interact with the following medications:  acitretin  aprepitant  armodafinil  bexarotene  bosentan  carbamazepine  certain medicines for fungal infections like fluconazole, ketoconazole, itraconazole and voriconazole  certain medicines to treat  hepatitis, HIV or AIDS  cyclosporine  felbamate  griseofulvin  lamotrigine  modafinil  oxcarbazepine  phenobarbital  phenytoin  primidone  rifabutin  rifampin  rifapentine  St. John's wort  topiramate This list may not describe all possible interactions. Give your health care provider a list of all the medicines, herbs, non-prescription drugs, or dietary supplements you use. Also tell them if you smoke, drink alcohol, or use illegal drugs. Some items may interact with your medicine. What should I watch for while using this medicine? This product does not protect you against HIV infection (AIDS) or other sexually transmitted diseases. You should be able to feel the implant by pressing your fingertips over the skin where it was inserted. Contact your doctor if you cannot feel the implant, and use a non-hormonal birth control method (such as condoms) until your doctor confirms that the implant is in place. Contact your doctor if you think that the implant may have broken or become bent while in your arm. You will receive a user card from your health care provider after the implant is inserted. The card is a record of the location of the implant in your upper arm and when it should be removed. Keep this card with your health records. What side effects may I notice from receiving this medicine? Side effects that you should report to your doctor or health care professional as soon as possible:  allergic reactions like skin rash, itching or hives, swelling of the face, lips, or tongue  breast lumps, breast tissue changes, or discharge  breathing problems  changes in emotions or moods  coughing up blood  if you feel that the implant   may have broken or bent while in your arm  high blood pressure  pain, irritation, swelling, or bruising at the insertion site  scar at site of insertion  signs of infection at the insertion site such as fever, and skin redness, pain or  discharge  signs and symptoms of a blood clot such as breathing problems; changes in vision; chest pain; severe, sudden headache; pain, swelling, warmth in the leg; trouble speaking; sudden numbness or weakness of the face, arm or leg  signs and symptoms of liver injury like dark yellow or brown urine; general ill feeling or flu-like symptoms; light-colored stools; loss of appetite; nausea; right upper belly pain; unusually weak or tired; yellowing of the eyes or skin  unusual vaginal bleeding, discharge Side effects that usually do not require medical attention (report to your doctor or health care professional if they continue or are bothersome):  acne  breast pain or tenderness  headache  irregular menstrual bleeding  nausea This list may not describe all possible side effects. Call your doctor for medical advice about side effects. You may report side effects to FDA at 1-800-FDA-1088. Where should I keep my medicine? This drug is given in a hospital or clinic and will not be stored at home. NOTE: This sheet is a summary. It may not cover all possible information. If you have questions about this medicine, talk to your doctor, pharmacist, or health care provider.  2020 Elsevier/Gold Standard (2019-01-20 11:33:04)  

## 2019-08-19 DIAGNOSIS — Z3046 Encounter for surveillance of implantable subdermal contraceptive: Secondary | ICD-10-CM | POA: Diagnosis not present

## 2019-08-19 DIAGNOSIS — Z30011 Encounter for initial prescription of contraceptive pills: Secondary | ICD-10-CM | POA: Diagnosis not present

## 2019-08-19 DIAGNOSIS — Z01419 Encounter for gynecological examination (general) (routine) without abnormal findings: Secondary | ICD-10-CM | POA: Diagnosis not present

## 2019-08-19 DIAGNOSIS — Z0389 Encounter for observation for other suspected diseases and conditions ruled out: Secondary | ICD-10-CM | POA: Diagnosis not present

## 2019-08-19 DIAGNOSIS — Z3009 Encounter for other general counseling and advice on contraception: Secondary | ICD-10-CM | POA: Diagnosis not present

## 2019-08-19 DIAGNOSIS — Z1388 Encounter for screening for disorder due to exposure to contaminants: Secondary | ICD-10-CM | POA: Diagnosis not present

## 2019-09-21 DIAGNOSIS — Z23 Encounter for immunization: Secondary | ICD-10-CM | POA: Diagnosis not present

## 2019-12-10 ENCOUNTER — Emergency Department (HOSPITAL_COMMUNITY)
Admission: EM | Admit: 2019-12-10 | Discharge: 2019-12-10 | Disposition: A | Discharge status: Home / Self Care | Payer: Medicaid Other | Attending: Emergency Medicine | Admitting: Emergency Medicine

## 2019-12-10 ENCOUNTER — Encounter (HOSPITAL_COMMUNITY): Payer: Self-pay | Admitting: Emergency Medicine

## 2019-12-10 ENCOUNTER — Other Ambulatory Visit: Payer: Self-pay

## 2019-12-10 DIAGNOSIS — R197 Diarrhea, unspecified: Secondary | ICD-10-CM | POA: Insufficient documentation

## 2019-12-10 DIAGNOSIS — Z5321 Procedure and treatment not carried out due to patient leaving prior to being seen by health care provider: Secondary | ICD-10-CM | POA: Insufficient documentation

## 2019-12-10 DIAGNOSIS — R109 Unspecified abdominal pain: Secondary | ICD-10-CM | POA: Diagnosis not present

## 2019-12-10 LAB — CBC
HCT: 41.3 % (ref 36.0–46.0)
Hemoglobin: 13.9 g/dL (ref 12.0–15.0)
MCH: 30 pg (ref 26.0–34.0)
MCHC: 33.7 g/dL (ref 30.0–36.0)
MCV: 89 fL (ref 80.0–100.0)
Platelets: 315 10*3/uL (ref 150–400)
RBC: 4.64 MIL/uL (ref 3.87–5.11)
RDW: 13.2 % (ref 11.5–15.5)
WBC: 5.7 10*3/uL (ref 4.0–10.5)
nRBC: 0 % (ref 0.0–0.2)

## 2019-12-10 LAB — URINALYSIS, ROUTINE W REFLEX MICROSCOPIC
Bilirubin Urine: NEGATIVE
Glucose, UA: NEGATIVE mg/dL
Hgb urine dipstick: NEGATIVE
Ketones, ur: NEGATIVE mg/dL
Nitrite: NEGATIVE
Protein, ur: NEGATIVE mg/dL
Specific Gravity, Urine: 1.026 (ref 1.005–1.030)
pH: 5 (ref 5.0–8.0)

## 2019-12-10 LAB — COMPREHENSIVE METABOLIC PANEL
ALT: 82 U/L — ABNORMAL HIGH (ref 0–44)
AST: 48 U/L — ABNORMAL HIGH (ref 15–41)
Albumin: 4.1 g/dL (ref 3.5–5.0)
Alkaline Phosphatase: 69 U/L (ref 38–126)
Anion gap: 10 (ref 5–15)
BUN: 10 mg/dL (ref 6–20)
CO2: 26 mmol/L (ref 22–32)
Calcium: 8.9 mg/dL (ref 8.9–10.3)
Chloride: 105 mmol/L (ref 98–111)
Creatinine, Ser: 0.82 mg/dL (ref 0.44–1.00)
GFR calc Af Amer: >60 mL/min (ref >60)
GFR calc non Af Amer: >60 mL/min (ref >60)
Glucose, Bld: 85 mg/dL (ref 70–99)
Potassium: 4 mmol/L (ref 3.5–5.1)
Sodium: 141 mmol/L (ref 135–145)
Total Bilirubin: 0.6 mg/dL (ref 0.3–1.2)
Total Protein: 7.6 g/dL (ref 6.5–8.1)

## 2019-12-10 LAB — LIPASE, BLOOD: Lipase: 30 U/L (ref 11–51)

## 2019-12-10 LAB — I-STAT BETA HCG BLOOD, ED (MC, WL, AP ONLY): I-stat hCG, quantitative: <5 m[IU]/mL (ref <5)

## 2019-12-10 NOTE — ED Triage Notes (Signed)
Patient c/o abdominal pain and diarrhea x4 days. Denies N/V. Hx kidney stones. Denies urinary sx.

## 2019-12-10 NOTE — ED Notes (Signed)
Patient was called for reassessing vital signs but no response. x2

## 2019-12-10 NOTE — ED Notes (Signed)
Patient was called for reassessing vital signs but no response. x1

## 2019-12-14 ENCOUNTER — Telehealth: Payer: Self-pay | Admitting: *Deleted

## 2019-12-14 NOTE — Telephone Encounter (Signed)
Attempted to call and assist with appointment for NP visit,no answer left call back number .                                                  Sharon Garner                                                  [facility]                                                  [phone]

## 2019-12-21 ENCOUNTER — Ambulatory Visit
Admission: EM | Admit: 2019-12-21 | Discharge: 2019-12-21 | Disposition: A | Discharge status: Home / Self Care | Payer: Medicaid Other | Attending: Emergency Medicine | Admitting: Emergency Medicine

## 2019-12-21 DIAGNOSIS — R202 Paresthesia of skin: Secondary | ICD-10-CM | POA: Diagnosis not present

## 2019-12-21 DIAGNOSIS — M79641 Pain in right hand: Secondary | ICD-10-CM

## 2019-12-21 DIAGNOSIS — M79642 Pain in left hand: Secondary | ICD-10-CM

## 2019-12-21 MED ORDER — PREDNISONE 10 MG PO TABS: ORAL_TABLET | ORAL | 0 refills | Status: DC

## 2019-12-21 MED ORDER — NAPROXEN 500 MG PO TABS: 500.0000 mg | ORAL_TABLET | Freq: Two times a day (BID) | ORAL | 0 refills | Status: DC

## 2019-12-21 NOTE — Discharge Instructions (Signed)
Wear wrist braces fracture support Begin prednisone taper over the next 6 days-begin with 6 tablets, decrease by 1 tablet each day until complete-6, 5, 4, 3, 2, 1-take with food and in the morning if you are able Naprosyn twice daily with food after course of prednisone Ice Follow-up if not improving or worsening

## 2019-12-21 NOTE — ED Triage Notes (Signed)
Pt c/o bilateral hand and wrist pain x4 days. States her hands feel tight, swelling, and tingling. Denies injury.

## 2019-12-21 NOTE — ED Provider Notes (Signed)
[facility]    CSN: [id number] Arrival date & time: 12/21/19  1123      History   Chief Complaint Chief Complaint  Patient presents with  . Hand Pain    HPI Sharon Garner is a 29 y.o. female presenting today for evaluation of bilateral hand pain.  Patient reports over the past 4 days she has had a lot of pain and swelling in her hands.  She feels as if her hands are painful and tight especially with making a fist.  She reports occasional numbness tingling sensation radiating up into fingers.  Reports a sensation in all 5 fingers.  Denies injury or fall.  Denies increase in activity, writing, or use of hands.  Denies history of similar.   HPI  Past Medical History:  Diagnosis Date  . Migraine   . Ovarian cyst     Patient Active Problem List   Diagnosis Date Noted  . History of C-section 05/11/2019  . Breech presentation 05/01/2019  . GBS bacteriuria 02/15/2019  . Cramping affecting pregnancy, antepartum 01/27/2019  . Supervision of other normal pregnancy, antepartum 11/18/2018  . Hx of cesarean section 11/18/2018  . Family history of mental retardation 05/16/2012  . Migraine headache without aura 11/09/2011    Past Surgical History:  Procedure Laterality Date  . CESAREAN SECTION N/A 10/17/2012   Procedure: CESAREAN SECTION;  Surgeon: Tereso Newcomer, MD;  Location: [facility];  Service: Obstetrics;  Laterality: N/A;  . CESAREAN SECTION N/A 05/11/2019   Procedure: CESAREAN SECTION;  Surgeon: Conan Bowens, MD;  Location: [facility];  Service: Obstetrics;  Laterality: N/A;    OB History    Gravida  5   Para  3   Term  3   Preterm      AB  2   Living  3     SAB  2   TAB      Ectopic      Multiple  0   Live Births  3            Home Medications    Prior to Admission medications   Medication Sig Start Date End Date Taking? Authorizing Provider  acetaminophen (TYLENOL) 325 MG tablet Take 650 mg by mouth every 6 (six) hours as  needed for mild pain or headache.    [provider]  naproxen (NAPROSYN) 500 MG tablet Take 1 tablet (500 mg total) by mouth 2 (two) times daily. 12/21/19   Coralynn Gaona C, PA-C  predniSONE (DELTASONE) 10 MG tablet Begin with 6 tabs on day 1, 5 tab on day 2, 4 tab on day 3, 3 tab on day 4, 2 tab on day 5, 1 tab on day 6-take with food 12/21/19   Katalia Choma, [place] C, PA-C    Family History Family History  Problem Relation Age of Onset  . Cancer Mother        reproductive  . Other Neg Hx   . Alcohol abuse Neg Hx   . Arthritis Neg Hx   . Asthma Neg Hx   . Birth defects Neg Hx   . COPD Neg Hx   . Depression Neg Hx   . Diabetes Neg Hx   . Drug abuse Neg Hx   . Early death Neg Hx   . Hearing loss Neg Hx   . Heart disease Neg Hx   . Hyperlipidemia Neg Hx   . Hypertension Neg Hx   . Kidney disease Neg Hx   .  Learning disabilities Neg Hx   . Mental illness Neg Hx   . Mental retardation Neg Hx   . Miscarriages / Stillbirths Neg Hx   . Stroke Neg Hx   . Vision loss Neg Hx     Social History Social History   Tobacco Use  . Smoking status: Never Smoker  . Smokeless tobacco: Never Used  Vaping Use  . Vaping Use: Never used  Substance Use Topics  . Alcohol use: No  . Drug use: No     Allergies   Patient has no known allergies.   Review of Systems Review of Systems  Constitutional: Negative for fatigue and fever.  Eyes: Negative for visual disturbance.  Respiratory: Negative for shortness of breath.   Cardiovascular: Negative for chest pain.  Gastrointestinal: Negative for abdominal pain, nausea and vomiting.  Musculoskeletal: Positive for arthralgias and joint swelling.  Skin: Negative for color change, rash and wound.  Neurological: Negative for dizziness, weakness, light-headedness and headaches.     Physical Exam Triage Vital Signs ED Triage Vitals [12/21/19 1253]  Enc Vitals Group     BP 125/82     Pulse Rate 65     Resp 18     Temp 97.7 F (36.5  C)     Temp Source Oral     SpO2 98 %     Weight      Height      Head Circumference      Peak Flow      Pain Score 5     Pain Loc      Pain Edu?      Excl. in GC?    No data found.  Updated Vital Signs BP 125/82 (BP Location: Left Arm)   Pulse 65   Temp 97.7 F (36.5 C) (Oral)   Resp 18   SpO2 98%   Breastfeeding No   Visual Acuity Right Eye Distance:   Left Eye Distance:   Bilateral Distance:    Right Eye Near:   Left Eye Near:    Bilateral Near:     Physical Exam Vitals and nursing note reviewed.  Constitutional:      Appearance: She is well-developed.     Comments: No acute distress  HENT:     Head: Normocephalic and atraumatic.     Ears:     Comments:      Nose: Nose normal.  Eyes:     Conjunctiva/sclera: Conjunctivae normal.  Cardiovascular:     Rate and Rhythm: Normal rate.  Pulmonary:     Effort: Pulmonary effort is normal. No respiratory distress.  Abdominal:     General: There is no distension.  Musculoskeletal:        General: Normal range of motion.     Cervical back: Neck supple.     Comments: Bilateral hands with mild swelling visible, no overlying erythema or discoloration, nontender to palpation of distal radius and ulna bilaterally, nontender throughout dorsum of hand, pain elicited with movement/flexion of DIP and PIP of fingers.  Negative Tinel's, negative Finkelstein's. Full active range of motion of all fingers wrist and elbows bilaterally Radial pulse 2+ bilaterally  Skin:    General: Skin is warm and dry.  Neurological:     Mental Status: She is alert and oriented to person, place, and time.      UC Treatments / Results  Labs (all labs ordered are listed, but only abnormal results are displayed) Labs Reviewed - No data to display  EKG   Radiology No results found.  Procedures Procedures (including critical care time)  Medications Ordered in UC Medications - No data to display  Initial Impression / Assessment and  Plan / UC Course  I have reviewed the triage vital signs and the nursing notes.  Pertinent labs & imaging results that were available during my care of the patient were reviewed by me and considered in my medical decision making (see chart for details).     No significant findings on exam, no weakness noted.  Suspect likely more inflammatory, no signs of gout, cellulitis.  4 days of symptoms without history of similar, lower suspicion of underlying autoimmune arthralgias.  At this time recommending course of prednisone followed by NSAIDs.  Wrist braces for support and immobilization.  Continue to monitor,Discussed strict return precautions. Patient verbalized understanding and is agreeable with plan.  Final Clinical Impressions(s) / UC Diagnoses   Final diagnoses:  Bilateral hand pain  Paresthesias     Discharge Instructions     Wear wrist braces fracture support Begin prednisone taper over the next 6 days-begin with 6 tablets, decrease by 1 tablet each day until complete-6, 5, 4, 3, 2, 1-take with food and in the morning if you are able Naprosyn twice daily with food after course of prednisone Ice Follow-up if not improving or worsening    ED Prescriptions    Medication Sig Dispense Auth. Provider   predniSONE (DELTASONE) 10 MG tablet Begin with 6 tabs on day 1, 5 tab on day 2, 4 tab on day 3, 3 tab on day 4, 2 tab on day 5, 1 tab on day 6-take with food 21 tablet Elianne Gubser C, PA-C   naproxen (NAPROSYN) 500 MG tablet Take 1 tablet (500 mg total) by mouth 2 (two) times daily. 30 tablet Tahtiana Rozier, [place] C, PA-C     PDMP not reviewed this encounter.   Sharyon Cable [place] C, PA-C 12/22/19 0715

## 2020-01-12 ENCOUNTER — Inpatient Hospital Stay (HOSPITAL_COMMUNITY)
Admission: AD | Admit: 2020-01-12 | Discharge: 2020-01-12 | Disposition: A | Discharge status: Home / Self Care | Payer: Medicaid Other | Attending: Obstetrics and Gynecology | Admitting: Obstetrics and Gynecology

## 2020-01-12 ENCOUNTER — Other Ambulatory Visit: Payer: Self-pay

## 2020-01-12 ENCOUNTER — Inpatient Hospital Stay (HOSPITAL_COMMUNITY): Payer: Medicaid Other

## 2020-01-12 ENCOUNTER — Encounter (HOSPITAL_COMMUNITY): Payer: Self-pay | Admitting: Obstetrics and Gynecology

## 2020-01-12 DIAGNOSIS — O3680X0 Pregnancy with inconclusive fetal viability, not applicable or unspecified: Secondary | ICD-10-CM

## 2020-01-12 DIAGNOSIS — Z3A09 9 weeks gestation of pregnancy: Secondary | ICD-10-CM | POA: Diagnosis not present

## 2020-01-12 DIAGNOSIS — R109 Unspecified abdominal pain: Secondary | ICD-10-CM | POA: Diagnosis not present

## 2020-01-12 DIAGNOSIS — O26891 Other specified pregnancy related conditions, first trimester: Secondary | ICD-10-CM

## 2020-01-12 DIAGNOSIS — Z3201 Encounter for pregnancy test, result positive: Secondary | ICD-10-CM | POA: Diagnosis not present

## 2020-01-12 DIAGNOSIS — O26899 Other specified pregnancy related conditions, unspecified trimester: Secondary | ICD-10-CM

## 2020-01-12 DIAGNOSIS — Z3A01 Less than 8 weeks gestation of pregnancy: Secondary | ICD-10-CM | POA: Diagnosis not present

## 2020-01-12 DIAGNOSIS — R102 Pelvic and perineal pain: Secondary | ICD-10-CM | POA: Diagnosis not present

## 2020-01-12 LAB — CBC WITH DIFFERENTIAL/PLATELET
Abs Immature Granulocytes: 0.03 10*3/uL (ref 0.00–0.07)
Basophils Absolute: 0.1 10*3/uL (ref 0.0–0.1)
Basophils Relative: 1 %
Eosinophils Absolute: 0.2 10*3/uL (ref 0.0–0.5)
Eosinophils Relative: 3 %
HCT: 39.3 % (ref 36.0–46.0)
Hemoglobin: 12.8 g/dL (ref 12.0–15.0)
Immature Granulocytes: 0 %
Lymphocytes Relative: 22 %
Lymphs Abs: 1.9 10*3/uL (ref 0.7–4.0)
MCH: 28.8 pg (ref 26.0–34.0)
MCHC: 32.6 g/dL (ref 30.0–36.0)
MCV: 88.3 fL (ref 80.0–100.0)
Monocytes Absolute: 0.6 10*3/uL (ref 0.1–1.0)
Monocytes Relative: 7 %
Neutro Abs: 5.9 10*3/uL (ref 1.7–7.7)
Neutrophils Relative %: 67 %
Platelets: 360 10*3/uL (ref 150–400)
RBC: 4.45 MIL/uL (ref 3.87–5.11)
RDW: 13.7 % (ref 11.5–15.5)
WBC: 8.7 10*3/uL (ref 4.0–10.5)
nRBC: 0 % (ref 0.0–0.2)

## 2020-01-12 LAB — URINALYSIS, ROUTINE W REFLEX MICROSCOPIC
Bilirubin Urine: NEGATIVE
Glucose, UA: 50 mg/dL — AB
Ketones, ur: NEGATIVE mg/dL
Nitrite: NEGATIVE
Protein, ur: NEGATIVE mg/dL
Specific Gravity, Urine: 1.019 (ref 1.005–1.030)
pH: 5 (ref 5.0–8.0)

## 2020-01-12 LAB — HCG, QUANTITATIVE, PREGNANCY: hCG, Beta Chain, Quant, S: 9437 m[IU]/mL — ABNORMAL HIGH (ref <5)

## 2020-01-12 LAB — HIV ANTIBODY (ROUTINE TESTING W REFLEX): HIV Screen 4th Generation wRfx: NONREACTIVE

## 2020-01-12 LAB — WET PREP, GENITAL
Clue Cells Wet Prep HPF POC: NONE SEEN
Sperm: NONE SEEN
Trich, Wet Prep: NONE SEEN
Yeast Wet Prep HPF POC: NONE SEEN

## 2020-01-12 LAB — POCT PREGNANCY, URINE: Preg Test, Ur: POSITIVE — AB

## 2020-01-12 NOTE — Discharge Instructions (Signed)
First Trimester of Pregnancy  The first trimester of pregnancy is from week 1 until the end of week 13 (months 1 through 3). During this time, your baby will begin to develop inside you. At 6-8 weeks, the eyes and face are formed, and the heartbeat can be seen on ultrasound. At the end of 12 weeks, all the baby's organs are formed. Prenatal care is all the medical care you receive before the birth of your baby. Make sure you get good prenatal care and follow all of your doctor's instructions. Follow these instructions at home: Medicines  Take over-the-counter and prescription medicines only as told by your doctor. Some medicines are safe and some medicines are not safe during pregnancy.  Take a prenatal vitamin that contains at least 600 micrograms (mcg) of folic acid.  If you have trouble pooping (constipation), take medicine that will make your stool soft (stool softener) if your doctor approves. Eating and drinking   Eat regular, healthy meals.  Your doctor will tell you the amount of weight gain that is right for you.  Avoid raw meat and uncooked cheese.  If you feel sick to your stomach (nauseous) or throw up (vomit): ? Eat 4 or 5 small meals a day instead of 3 large meals. ? Try eating a few soda crackers. ? Drink liquids between meals instead of during meals.  To prevent constipation: ? Eat foods that are high in fiber, like fresh fruits and vegetables, whole grains, and beans. ? Drink enough fluids to keep your pee (urine) clear or pale yellow. Activity  Exercise only as told by your doctor. Stop exercising if you have cramps or pain in your lower belly (abdomen) or low back.  Do not exercise if it is too hot, too humid, or if you are in a place of great height (high altitude).  Try to avoid standing for long periods of time. Move your legs often if you must stand in one place for a long time.  Avoid heavy lifting.  Wear low-heeled shoes. Sit and stand up  straight.  You can have sex unless your doctor tells you not to. Relieving pain and discomfort  Wear a good support bra if your breasts are sore.  Take warm water baths (sitz baths) to soothe pain or discomfort caused by hemorrhoids. Use hemorrhoid cream if your doctor says it is okay.  Rest with your legs raised if you have leg cramps or low back pain.  If you have puffy, bulging veins (varicose veins) in your legs: ? Wear support hose or compression stockings as told by your doctor. ? Raise (elevate) your feet for 15 minutes, 3-4 times a day. ? Limit salt in your food. Prenatal care  Schedule your prenatal visits by the twelfth week of pregnancy.  Write down your questions. Take them to your prenatal visits.  Keep all your prenatal visits as told by your doctor. This is important. Safety  Wear your seat belt at all times when driving.  Make a list of emergency phone numbers. The list should include numbers for family, friends, the hospital, and police and fire departments. General instructions  Ask your doctor for a referral to a local prenatal class. Begin classes no later than at the start of month 6 of your pregnancy.  Ask for help if you need counseling or if you need help with nutrition. Your doctor can give you advice or tell you where to go for help.  Do not use hot tubs, steam   rooms, or saunas.  Do not douche or use tampons or scented sanitary pads.  Do not cross your legs for long periods of time.  Avoid all herbs and alcohol. Avoid drugs that are not approved by your doctor.  Do not use any tobacco products, including cigarettes, chewing tobacco, and electronic cigarettes. If you need help quitting, ask your doctor. You may get counseling or other support to help you quit.  Avoid cat litter boxes and soil used by cats. These carry germs that can cause birth defects in the baby and can cause a loss of your baby (miscarriage) or stillbirth.  Visit your dentist.  At home, brush your teeth with a soft toothbrush. Be gentle when you floss. Contact a doctor if:  You are dizzy.  You have mild cramps or pressure in your lower belly.  You have a nagging pain in your belly area.  You continue to feel sick to your stomach, you throw up, or you have watery poop (diarrhea).  You have a bad smelling fluid coming from your vagina.  You have pain when you pee (urinate).  You have increased puffiness (swelling) in your face, hands, legs, or ankles. Get help right away if:  You have a fever.  You are leaking fluid from your vagina.  You have spotting or bleeding from your vagina.  You have very bad belly cramping or pain.  You gain or lose weight rapidly.  You throw up blood. It may look like coffee grounds.  You are around people who have [place] measles, fifth disease, or chickenpox.  You have a very bad headache.  You have shortness of breath.  You have any kind of trauma, such as from a fall or a car accident. Summary  The first trimester of pregnancy is from week 1 until the end of week 13 (months 1 through 3).  To take care of yourself and your unborn baby, you will need to eat healthy meals, take medicines only if your doctor tells you to do so, and do activities that are safe for you and your baby.  Keep all follow-up visits as told by your doctor. This is important as your doctor will have to ensure that your baby is healthy and growing well. This information is not intended to replace advice given to you by your health care provider. Make sure you discuss any questions you have with your health care provider. Document Revised: 07/31/2018 Document Reviewed: 04/17/2016 Elsevier Patient Education  2020 [employer].   [facility]          [facility] [employer] locations:  Hours may vary. Please call for an appointment  [facility] for [facility] @ [facility]  [address] [phone]  [facility] @ [facility]   [address]  [phone]  [facility] @ [facility]       [address] [phone]            [facility] for [facility] @ [place]     [phone]          [facility] @ [facility]   [address] [phone]  [facility] for [facility] @ [facility]  [address] [phone]     [facility] for [facility] @ [facility]  [address]   [phone]

## 2020-01-12 NOTE — MAU Provider Note (Signed)
History     CSN: [id number]  Arrival date and time: 01/12/20 1100   First Provider Initiated Contact with Patient 01/12/20 1141      Chief Complaint  Patient presents with  . Pelvic Pain   HPI  [name] is a 29 y.o. [phone] at [redacted]w[redacted]d by LMP who presents to MAU today with complaint of pelvic cramping and low back pain x 2 days. She states she had SAB last year and feels similar. She denies vaginal bleeding, discharge, UTI symptoms, N/V/D or constipation or fever. She rates her pain at 5/10 now and took Tylenol this morning. She states LMP late July, but had negative hCG in ED in August.   OB History    Gravida  6   Para  3   Term  3   Preterm      AB  2   Living  3     SAB  2   TAB      Ectopic      Multiple  0   Live Births  3           Past Medical History:  Diagnosis Date  . Migraine   . Ovarian cyst     Past Surgical History:  Procedure Laterality Date  . CESAREAN SECTION N/A 10/17/2012   Procedure: CESAREAN SECTION;  Surgeon: Tereso Newcomer, MD;  Location: [facility];  Service: Obstetrics;  Laterality: N/A;  . CESAREAN SECTION N/A 05/11/2019   Procedure: CESAREAN SECTION;  Surgeon: Conan Bowens, MD;  Location: [facility];  Service: Obstetrics;  Laterality: N/A;    Family History  Problem Relation Age of Onset  . Cancer Mother        reproductive  . Other Neg Hx   . Alcohol abuse Neg Hx   . Arthritis Neg Hx   . Asthma Neg Hx   . Birth defects Neg Hx   . COPD Neg Hx   . Depression Neg Hx   . Diabetes Neg Hx   . Drug abuse Neg Hx   . Early death Neg Hx   . Hearing loss Neg Hx   . Heart disease Neg Hx   . Hyperlipidemia Neg Hx   . Hypertension Neg Hx   . Kidney disease Neg Hx   . Learning disabilities Neg Hx   . Mental illness Neg Hx   . Mental retardation Neg Hx   . Miscarriages / Stillbirths Neg Hx   . Stroke Neg Hx   . Vision loss Neg Hx     Social History   Tobacco Use  . Smoking status: Never Smoker  .  Smokeless tobacco: Never Used  Vaping Use  . Vaping Use: Never used  Substance Use Topics  . Alcohol use: No  . Drug use: No    Allergies: No Known Allergies  Medications Prior to Admission  Medication Sig Dispense Refill Last Dose  . acetaminophen (TYLENOL) 325 MG tablet Take 650 mg by mouth every 6 (six) hours as needed for moderate pain.      . naproxen (NAPROSYN) 500 MG tablet Take 1 tablet (500 mg total) by mouth 2 (two) times daily. 30 tablet 0   . predniSONE (DELTASONE) 10 MG tablet Begin with 6 tabs on day 1, 5 tab on day 2, 4 tab on day 3, 3 tab on day 4, 2 tab on day 5, 1 tab on day 6-take with food 21 tablet 0     Review of Systems  Constitutional: Negative for fever.  Gastrointestinal: Positive for abdominal pain. Negative for constipation, diarrhea, nausea and vomiting.  Genitourinary: Negative for dysuria, frequency, urgency, vaginal bleeding and vaginal discharge.  Musculoskeletal: Positive for back pain.   Physical Exam   Blood pressure 129/66, pulse 75, resp. rate 18, height 5\' 1"  (1.549 m), weight 80 kg, last menstrual period 11/10/2019, SpO2 99 %, not currently breastfeeding.  Physical Exam Vitals and nursing note reviewed. Exam conducted with a chaperone present.  Constitutional:      General: She is not in acute distress.    Appearance: She is well-developed. She is obese.  HENT:     Head: Normocephalic and atraumatic.  Cardiovascular:     Rate and Rhythm: Normal rate.  Pulmonary:     Effort: Pulmonary effort is normal.  Abdominal:     General: There is no distension.     Palpations: Abdomen is soft. There is no mass.     Tenderness: There is abdominal tenderness (mild tenderness to palpation of the lower abdomen at midline). There is no guarding or rebound.  Genitourinary:    General: Normal vulva.     Vagina: Vaginal discharge (scant, white) present. No bleeding.     Uterus: Normal. Tender (mild).      Adnexa: Right adnexa normal and left adnexa  normal.  Skin:    General: Skin is warm and dry.     Findings: No erythema.  Neurological:     Mental Status: She is alert and oriented to person, place, and time.   Dilation: Closed Effacement (%): Thick Cervical Position: Posterior Exam by:: [email or web address], PA-C   Results for orders placed or performed during the hospital encounter of 01/12/20 (from the past 24 hour(s))  Pregnancy, urine POC     Status: Abnormal   Collection Time: 01/12/20 11:15 AM  Result Value Ref Range   Preg Test, Ur POSITIVE (A) NEGATIVE  Urinalysis, Routine w reflex microscopic     Status: Abnormal   Collection Time: 01/12/20 11:17 AM  Result Value Ref Range   Color, Urine YELLOW YELLOW   APPearance HAZY (A) CLEAR   Specific Gravity, Urine 1.019 1.005 - 1.030   pH 5.0 5.0 - 8.0   Glucose, UA 50 (A) NEGATIVE mg/dL   Hgb urine dipstick SMALL (A) NEGATIVE   Bilirubin Urine NEGATIVE NEGATIVE   Ketones, ur NEGATIVE NEGATIVE mg/dL   Protein, ur NEGATIVE NEGATIVE mg/dL   Nitrite NEGATIVE NEGATIVE   Leukocytes,Ua MODERATE (A) NEGATIVE   RBC / HPF 0-5 0 - 5 RBC/hpf   WBC, UA 0-5 0 - 5 WBC/hpf   Bacteria, UA RARE (A) NONE SEEN   Squamous Epithelial / LPF 6-10 0 - 5   Mucus PRESENT   CBC with Differential/Platelet     Status: None   Collection Time: 01/12/20 11:41 AM  Result Value Ref Range   WBC 8.7 4.0 - 10.5 K/uL   RBC 4.45 3.87 - 5.11 MIL/uL   Hemoglobin 12.8 12.0 - 15.0 g/dL   HCT 01/14/20 36 - 46 %   MCV 88.3 80.0 - 100.0 fL   MCH 28.8 26.0 - 34.0 pg   MCHC 32.6 30.0 - 36.0 g/dL   RDW 41.7 40.8 - 14.4 %   Platelets 360 150 - 400 K/uL   nRBC 0.0 0.0 - 0.2 %   Neutrophils Relative % 67 %   Neutro Abs 5.9 1.7 - 7.7 K/uL   Lymphocytes Relative 22 %   Lymphs Abs 1.9 0.7 - 4.0  K/uL   Monocytes Relative 7 %   Monocytes Absolute 0.6 0 - 1 K/uL   Eosinophils Relative 3 %   Eosinophils Absolute 0.2 0 - 0 K/uL   Basophils Relative 1 %   Basophils Absolute 0.1 0 - 0 K/uL   Immature Granulocytes 0 %   Abs  Immature Granulocytes 0.03 0.00 - 0.07 K/uL  Wet prep, genital     Status: Abnormal   Collection Time: 01/12/20 11:41 AM   Specimen: Vaginal  Result Value Ref Range   Yeast Wet Prep HPF POC NONE SEEN NONE SEEN   Trich, Wet Prep NONE SEEN NONE SEEN   Clue Cells Wet Prep HPF POC NONE SEEN NONE SEEN   WBC, Wet Prep HPF POC FEW (A) NONE SEEN   Sperm NONE SEEN    US OB LESS THAN 14 WEEKS WITH OB TRANSVAGINAL  Result Date: 01/12/2020 CLINICAL DATA:  Pelvic pain and cramping EXAM: OBSTETRIC <14 WK [place] AND TRANSVAGINAL OB US TECHNIQUE: Both transabdominal and transvaginal ultrasound examinations were performed for complete evaluation of the gestation as well as the maternal uterus, adnexal regions, and pelvic cul-de-sac. Transvaginal technique was performed to assess early pregnancy. COMPARISON:  None. FINDINGS: Intrauterine gestational sac: Visualized-single Yolk sac:  Visualized Embryo:  Not visualized Cardiac Activity: Not visualized MSD: 8 mm   5 w   4 d Subchorionic hemorrhage:  None visualized. Maternal uterus/adnexae: Cervical os is closed. Right ovary measures 3.5 x 1.9 x 1.7 cm. Left ovary measures 3.4 x 2.3 x 2.9 cm. No extrauterine pelvic mass. No free pelvic fluid. IMPRESSION: Within the uterus, there is a gestational sac. Yolk sac is seen. Fetal pole currently not seen. This circumstance warrants a follow-up ultrasound in 10-14 days to assess for fetal pole and fetal heart activity. No subchorionic hemorrhage. No extrauterine pelvic mass or fluid evident. Electronically Signed   By: Bretta Bang III M.D.   On: 01/12/2020 12:20    MAU Course  Procedures None  MDM +UPT UA, wet prep, GC/chlamydia, CBC, quant hCG, HIV, RPR and [place] today to rule out ectopic pregnancy A+ blood type in Epic from previous encounter  IUGS and YS noted on [place]. Patient will be scheduled for viability [place] in 10-14 days since she will be receiving prenatal care with a [facility] practice.   Assessment and Plan   A: Abdominal pain in pregnancy, first trimester   P:  Discharge home Tylenol PRN for pain  First trimester precautions discussed Patient advised to follow-up with [facility] [place] for viability [place] prior to starting prenatal care  Patient has been seen at [facility] previously, but states that is not convenient anymore and would like an office in [place]. List given of [facility] practices and recommended to call [facility] or [facility] to start care.  Patient may return to MAU as needed or if her condition were to change or worsen  Vonzella Nipple, PA-C 01/12/2020, 12:47 PM

## 2020-01-12 NOTE — MAU Note (Signed)
Pt reports pelvic pain that wraps to her back, feels like cramping. Denies bleeding. + pregnancy test today at [facility] health dept. States her last LMP was July 20th.

## 2020-01-13 LAB — GC/CHLAMYDIA PROBE AMP (~~LOC~~) NOT AT ARMC
Chlamydia: NEGATIVE
Comment: NEGATIVE
Comment: NORMAL
Neisseria Gonorrhea: NEGATIVE

## 2020-01-13 LAB — RPR: RPR Ser Ql: NONREACTIVE

## 2020-01-21 ENCOUNTER — Other Ambulatory Visit: Payer: Self-pay

## 2020-01-21 MED ORDER — CYCLOBENZAPRINE HCL 10 MG PO TABS: 10.0000 mg | ORAL_TABLET | Freq: Three times a day (TID) | ORAL | 1 refills | Status: DC | PRN

## 2020-01-21 NOTE — Progress Notes (Signed)
Patient called stating she has had migarine for four days. Tylenol not helping. Patient states Excedrin migraine has helped in past. Patient is 10 weeks.   Per Dr. Lucinda Dell 10mg  TID prn for headache. Dispense #20 with 1 RF.   Called patient back and made her aware and script sent electronically. RN

## 2020-01-26 ENCOUNTER — Ambulatory Visit
Admission: RE | Admit: 2020-01-26 | Discharge: 2020-01-26 | Disposition: A | Discharge status: Home / Self Care | Payer: Medicaid Other | Source: Ambulatory Visit | Attending: Medical | Admitting: Medical

## 2020-01-26 ENCOUNTER — Other Ambulatory Visit: Payer: Self-pay

## 2020-01-26 DIAGNOSIS — O3680X0 Pregnancy with inconclusive fetal viability, not applicable or unspecified: Secondary | ICD-10-CM | POA: Insufficient documentation

## 2020-01-26 DIAGNOSIS — Z3A01 Less than 8 weeks gestation of pregnancy: Secondary | ICD-10-CM | POA: Diagnosis not present

## 2020-01-28 ENCOUNTER — Ambulatory Visit (INDEPENDENT_AMBULATORY_CARE_PROVIDER_SITE_OTHER): Payer: Medicaid Other | Admitting: Family Medicine

## 2020-01-28 ENCOUNTER — Other Ambulatory Visit (HOSPITAL_COMMUNITY)
Admission: RE | Admit: 2020-01-28 | Discharge: 2020-01-28 | Disposition: A | Discharge status: Home / Self Care | Payer: Medicaid Other | Source: Ambulatory Visit | Attending: Family Medicine | Admitting: Family Medicine

## 2020-01-28 ENCOUNTER — Other Ambulatory Visit: Payer: Self-pay

## 2020-01-28 VITALS — BP 109/66 | HR 78 | Wt 178.0 lb

## 2020-01-28 DIAGNOSIS — O09893 Supervision of other high risk pregnancies, third trimester: Secondary | ICD-10-CM

## 2020-01-28 DIAGNOSIS — Z3A01 Less than 8 weeks gestation of pregnancy: Secondary | ICD-10-CM | POA: Diagnosis not present

## 2020-01-28 DIAGNOSIS — Z3480 Encounter for supervision of other normal pregnancy, unspecified trimester: Secondary | ICD-10-CM | POA: Diagnosis not present

## 2020-01-28 DIAGNOSIS — G43009 Migraine without aura, not intractable, without status migrainosus: Secondary | ICD-10-CM

## 2020-01-28 DIAGNOSIS — O09891 Supervision of other high risk pregnancies, first trimester: Secondary | ICD-10-CM

## 2020-01-28 DIAGNOSIS — Z3481 Encounter for supervision of other normal pregnancy, first trimester: Secondary | ICD-10-CM | POA: Diagnosis present

## 2020-01-28 DIAGNOSIS — Z98891 History of uterine scar from previous surgery: Secondary | ICD-10-CM

## 2020-01-28 HISTORY — DX: Supervision of other high risk pregnancies, third trimester: O09.893

## 2020-01-28 MED ORDER — CYCLOBENZAPRINE HCL 10 MG PO TABS: 5.0000 mg | ORAL_TABLET | Freq: Three times a day (TID) | ORAL | 1 refills | Status: DC | PRN

## 2020-01-28 NOTE — Progress Notes (Signed)
Subjective:  Sharon Garner is a [id number] [redacted]w[redacted]d being seen today for her first obstetrical visit.  Her obstetrical history is significant for 2 prior cesarean deliveries due to breech position. First pregnancy was vaginal delivery. FOB is patient's husband. Patient does intend to breast feed. Pregnancy history fully reviewed.  Patient reports nausea and migraines.  BP 109/66   Pulse 78   Wt 178 lb (80.7 kg)   LMP 11/10/2019   BMI 33.63 kg/m   HISTORY: OB History  Gravida Para Term Preterm AB Living  6 3 3   2 3   SAB TAB Ectopic Multiple Live Births  2     0 3    # Outcome Date GA Lbr Len/2nd Weight Sex Delivery Anes PTL Lv  6 Current           5 Term 05/11/19 [redacted]w[redacted]d  5 lb 14.8 oz (2.688 kg) F CS-LTranv Spinal  LIV  4 SAB 2020          3 Term 10/17/12 [redacted]w[redacted]d  6 lb 14.9 oz (3.145 kg) F CS-LTranv Spinal  LIV  2 Term 2010    F Vag-Spont   LIV  1 SAB             Past Medical History:  Diagnosis Date  . Migraine   . Ovarian cyst     Past Surgical History:  Procedure Laterality Date  . CESAREAN SECTION N/A 10/17/2012   Procedure: CESAREAN SECTION;  Surgeon: 10/19/2012, MD;  Location: [facility];  Service: Obstetrics;  Laterality: N/A;  . CESAREAN SECTION N/A 05/11/2019   Procedure: CESAREAN SECTION;  Surgeon: 05/13/2019, MD;  Location: [facility];  Service: Obstetrics;  Laterality: N/A;    Family History  Problem Relation Age of Onset  . Cancer Mother        reproductive  . Other Neg Hx   . Alcohol abuse Neg Hx   . Arthritis Neg Hx   . Asthma Neg Hx   . Birth defects Neg Hx   . COPD Neg Hx   . Depression Neg Hx   . Diabetes Neg Hx   . Drug abuse Neg Hx   . Early death Neg Hx   . Hearing loss Neg Hx   . Heart disease Neg Hx   . Hyperlipidemia Neg Hx   . Hypertension Neg Hx   . Kidney disease Neg Hx   . Learning disabilities Neg Hx   . Mental illness Neg Hx   . Mental retardation Neg Hx   . Miscarriages / Stillbirths Neg Hx   . Stroke Neg Hx   . Vision  loss Neg Hx      Exam  BP 109/66   Pulse 78   Wt 178 lb (80.7 kg)   LMP 11/10/2019   BMI 33.63 kg/m   Chaperone present during exam  CONSTITUTIONAL: Well-developed, well-nourished female in no acute distress.  HENT:  Normocephalic, atraumatic, External right and left ear normal. Oropharynx is clear and moist EYES: Conjunctivae and EOM are normal. Pupils are equal, round, and reactive to light. No scleral icterus.  NECK: Normal range of motion, supple, no masses.  Normal thyroid.  CARDIOVASCULAR: Normal heart rate noted, regular rhythm RESPIRATORY: Clear to auscultation bilaterally. Effort and breath sounds normal, no problems with respiration noted. BREASTS: Symmetric in size. No masses, skin changes, nipple drainage, or lymphadenopathy. ABDOMEN: Soft, normal bowel sounds, no distention noted.  No tenderness, rebound or guarding.  PELVIC: Normal appearing external genitalia;  normal appearing vaginal mucosa and cervix. No abnormal discharge noted. Normal uterine size, no other palpable masses, no uterine or adnexal tenderness. MUSCULOSKELETAL: Normal range of motion. No tenderness.  No cyanosis, clubbing, or edema.  2+ distal pulses. SKIN: Skin is warm and dry. No rash noted. Not diaphoretic. No erythema. No pallor. NEUROLOGIC: Alert and oriented to person, place, and time. Normal reflexes, muscle tone coordination. No cranial nerve deficit noted. PSYCHIATRIC: Normal mood and affect. Normal behavior. Normal judgment and thought content.    Assessment:    Pregnancy: [id number] Patient Active Problem List   Diagnosis Date Noted  . Supervision of other normal pregnancy, antepartum 01/28/2020  . History of C-section 05/11/2019  . Breech presentation 05/01/2019  . GBS bacteriuria 02/15/2019  . Cramping affecting pregnancy, antepartum 01/27/2019  . Hx of cesarean section 11/18/2018  . Family history of mental retardation 05/16/2012  . Migraine headache without aura 11/09/2011       Plan:   1. [redacted] weeks gestation of pregnancy - Cytology - PAP( [place]) - Urine Culture - CBC/D/Plt+RPR+Rh+ABO+Rub Ab...  2. Supervision of other normal pregnancy, antepartum FHT and FH normal Desires genetic testing, which we will get next appt. - Cytology - PAP( [place]) - Urine Culture - CBC/D/Plt+RPR+Rh+ABO+Rub Ab...  3. Short interval between pregnancies affecting pregnancy in first trimester, antepartum  4. History of C-section Desires VBAC - discussed only able to VBAC if comes in natural labor. Discussed risks. Patient left prior to signing consent form.     Problem list reviewed and updated. 75% of 30 min visit spent on counseling and coordination of care.     Levie Heritage 01/28/2020

## 2020-01-28 NOTE — Progress Notes (Signed)
Patient presents for New OB. Patient was seen on 01-26-20 and dates determined by early utlrasound. Patient was on OCPs when she conceived.  Armandina Stammer RN

## 2020-01-29 LAB — CBC/D/PLT+RPR+RH+ABO+RUB AB...
Antibody Screen: NEGATIVE
Basophils Absolute: 0 10*3/uL (ref 0.0–0.2)
Basos: 0 %
EOS (ABSOLUTE): 0.4 10*3/uL (ref 0.0–0.4)
Eos: 4 %
HCV Ab: <0.1 s/co ratio (ref 0.0–0.9)
HIV Screen 4th Generation wRfx: NONREACTIVE
Hematocrit: 41.2 % (ref 34.0–46.6)
Hemoglobin: 13.9 g/dL (ref 11.1–15.9)
Hepatitis B Surface Ag: NEGATIVE
Immature Grans (Abs): 0 10*3/uL (ref 0.0–0.1)
Immature Granulocytes: 0 %
Lymphocytes Absolute: 2.3 10*3/uL (ref 0.7–3.1)
Lymphs: 22 %
MCH: 29.4 pg (ref 26.6–33.0)
MCHC: 33.7 g/dL (ref 31.5–35.7)
MCV: 87 fL (ref 79–97)
Monocytes Absolute: 0.7 10*3/uL (ref 0.1–0.9)
Monocytes: 7 %
Neutrophils Absolute: 7.2 10*3/uL — ABNORMAL HIGH (ref 1.4–7.0)
Neutrophils: 67 %
Platelets: 371 10*3/uL (ref 150–450)
RBC: 4.73 x10E6/uL (ref 3.77–5.28)
RDW: 14 % (ref 11.7–15.4)
RPR Ser Ql: NONREACTIVE
Rh Factor: POSITIVE
Rubella Antibodies, IGG: 1.54 index (ref >0.99)
WBC: 10.8 10*3/uL (ref 3.4–10.8)

## 2020-01-29 LAB — HCV INTERPRETATION

## 2020-01-30 LAB — URINE CULTURE

## 2020-02-01 LAB — CYTOLOGY - PAP
Chlamydia: NEGATIVE
Comment: NEGATIVE
Comment: NORMAL
Diagnosis: NEGATIVE
Neisseria Gonorrhea: NEGATIVE

## 2020-02-04 ENCOUNTER — Other Ambulatory Visit: Payer: Self-pay

## 2020-02-04 DIAGNOSIS — O219 Vomiting of pregnancy, unspecified: Secondary | ICD-10-CM

## 2020-02-04 MED ORDER — PROMETHAZINE HCL 25 MG PO TABS: 25.0000 mg | ORAL_TABLET | Freq: Four times a day (QID) | ORAL | 0 refills | Status: DC | PRN

## 2020-03-03 ENCOUNTER — Encounter: Payer: Medicaid Other | Admitting: Obstetrics and Gynecology

## 2020-03-09 ENCOUNTER — Encounter: Payer: Self-pay | Admitting: Obstetrics

## 2020-03-09 ENCOUNTER — Other Ambulatory Visit: Payer: Self-pay

## 2020-03-09 ENCOUNTER — Ambulatory Visit (INDEPENDENT_AMBULATORY_CARE_PROVIDER_SITE_OTHER): Payer: Medicaid Other | Admitting: Obstetrics

## 2020-03-09 VITALS — BP 122/75 | HR 94 | Wt 178.0 lb

## 2020-03-09 DIAGNOSIS — K117 Disturbances of salivary secretion: Secondary | ICD-10-CM

## 2020-03-09 DIAGNOSIS — Z98891 History of uterine scar from previous surgery: Secondary | ICD-10-CM

## 2020-03-09 DIAGNOSIS — Z3A13 13 weeks gestation of pregnancy: Secondary | ICD-10-CM

## 2020-03-09 DIAGNOSIS — Z3480 Encounter for supervision of other normal pregnancy, unspecified trimester: Secondary | ICD-10-CM

## 2020-03-09 DIAGNOSIS — O09891 Supervision of other high risk pregnancies, first trimester: Secondary | ICD-10-CM

## 2020-03-09 DIAGNOSIS — Z3482 Encounter for supervision of other normal pregnancy, second trimester: Secondary | ICD-10-CM

## 2020-03-09 DIAGNOSIS — G43009 Migraine without aura, not intractable, without status migrainosus: Secondary | ICD-10-CM

## 2020-03-09 MED ORDER — GLYCOPYRROLATE 1 MG PO TABS: 1.0000 mg | ORAL_TABLET | Freq: Two times a day (BID) | ORAL | 5 refills | Status: DC

## 2020-03-09 NOTE — Progress Notes (Signed)
Subjective:    Sharon Garner is being seen today for her first obstetrical visit.  This is not a planned pregnancy. She is at [redacted]w[redacted]d gestation. Her obstetrical history is significant for ptyalism. Relationship with FOB: spouse, living together. Patient does intend to breast feed. Pregnancy history fully reviewed.  The information documented in the HPI was reviewed and verified.  Menstrual History: OB History    Gravida  6   Para  3   Term  3   Preterm      AB  2   Living  3     SAB  2   TAB      Ectopic      Multiple  0   Live Births  3            Patient's last menstrual period was 11/10/2019.    Past Medical History:  Diagnosis Date  . Migraine   . Ovarian cyst     Past Surgical History:  Procedure Laterality Date  . CESAREAN SECTION N/A 10/17/2012   Procedure: CESAREAN SECTION;  Surgeon: Tereso Newcomer, MD;  Location: [facility];  Service: Obstetrics;  Laterality: N/A;  . CESAREAN SECTION N/A 05/11/2019   Procedure: CESAREAN SECTION;  Surgeon: Conan Bowens, MD;  Location: [facility];  Service: Obstetrics;  Laterality: N/A;    (Not in a hospital admission)  No Known Allergies  Social History   Tobacco Use  . Smoking status: Never Smoker  . Smokeless tobacco: Never Used  Substance Use Topics  . Alcohol use: No    Family History  Problem Relation Age of Onset  . Cancer Mother        reproductive  . Other Neg Hx   . Alcohol abuse Neg Hx   . Arthritis Neg Hx   . Asthma Neg Hx   . Birth defects Neg Hx   . COPD Neg Hx   . Depression Neg Hx   . Diabetes Neg Hx   . Drug abuse Neg Hx   . Early death Neg Hx   . Hearing loss Neg Hx   . Heart disease Neg Hx   . Hyperlipidemia Neg Hx   . Hypertension Neg Hx   . Kidney disease Neg Hx   . Learning disabilities Neg Hx   . Mental illness Neg Hx   . Mental retardation Neg Hx   . Miscarriages / Stillbirths Neg Hx   . Stroke Neg Hx   . Vision loss Neg Hx      Review of  Systems Constitutional: negative for weight loss Gastrointestinal: positive for nausea and vomiting, only on Mondays and Tuesdays Genitourinary:negative for genital lesions and vaginal discharge and dysuria Musculoskeletal:negative for back pain Behavioral/Psych: negative for abusive relationship, depression, illegal drug usage and tobacco use    Objective:    BP 122/75   Pulse 94   Wt 178 lb (80.7 kg)   LMP 11/10/2019   BMI 33.63 kg/m  General Appearance:    Alert, cooperative, no distress, appears stated age  Head:    Normocephalic, without obvious abnormality, atraumatic  Eyes:    PERRL, conjunctiva/corneas clear, EOM's intact, fundi    benign, both eyes  Ears:    Normal TM's and external ear canals, both ears  Nose:   Nares normal, septum midline, mucosa normal, no drainage    or sinus tenderness  Throat:   Lips, mucosa, and tongue normal; teeth and gums normal  Neck:   Supple, symmetrical, trachea midline,  no adenopathy;    thyroid:  no enlargement/tenderness/nodules; no carotid   bruit or JVD  Back:     Symmetric, no curvature, ROM normal, no CVA tenderness  Lungs:     Clear to auscultation bilaterally, respirations unlabored  Chest Wall:    No tenderness or deformity   Heart:    Regular rate and rhythm, S1 and S2 normal, no murmur, rub   or gallop  Breast Exam:    No tenderness, masses, or nipple abnormality  Abdomen:     Soft, non-tender, bowel sounds active all four quadrants,    no masses, no organomegaly  Genitalia:    Normal female without lesion, discharge or tenderness  Extremities:   Extremities normal, atraumatic, no cyanosis or edema  Pulses:   2+ and symmetric all extremities  Skin:   Skin color, texture, turgor normal, no rashes or lesions  Lymph nodes:   Cervical, supraclavicular, and axillary nodes normal  Neurologic:   CNII-XII intact, normal strength, sensation and reflexes    throughout      Lab Review Urine pregnancy test Labs reviewed  yes Radiologic studies reviewed yes  Assessment:    Pregnancy at [redacted]w[redacted]d weeks    Plan:     1. Supervision of other normal pregnancy, antepartum Rx: - Genetic Screening - [place] MFM OB COMP + 14 WK; Future  2. Short interval between pregnancies affecting pregnancy in first trimester, antepartum  3. History of C-section x 2  4. Migraine without aura and without status migrainosus, not intractable - clinically stable  5. Ptyalism Rx: - glycopyrrolate (ROBINUL) 1 MG tablet; Take 1 tablet (1 mg total) by mouth 2 (two) times daily.  Dispense: 60 tablet; Refill: 5   Prenatal vitamins.  Counseling provided regarding continued use of seat belts, cessation of alcohol consumption, smoking or use of illicit drugs; infection precautions i.e., influenza/TDAP immunizations, toxoplasmosis,CMV, parvovirus, listeria and varicella; workplace safety, exercise during pregnancy; routine dental care, safe medications, sexual activity, hot tubs, saunas, pools, travel, caffeine use, fish and methlymercury, potential toxins, hair treatments, varicose veins Weight gain recommendations per IOM guidelines reviewed: underweight/BMI< 18.5--> gain 28 - 40 lbs; normal weight/BMI 18.5 - 24.9--> gain 25 - 35 lbs; overweight/BMI 25 - 29.9--> gain 15 - 25 lbs; obese/BMI >30->gain  11 - 20 lbs Problem list reviewed and updated. FIRST/CF mutation testing/NIPT/QUAD SCREEN/fragile X/Ashkenazi Jewish population testing/Spinal muscular atrophy discussed: requested. Role of ultrasound in pregnancy discussed; fetal survey: requested. Amniocentesis discussed: not indicated.  Meds ordered this encounter  Medications  . glycopyrrolate (ROBINUL) 1 MG tablet    Sig: Take 1 tablet (1 mg total) by mouth 2 (two) times daily.    Dispense:  60 tablet    Refill:  5   Orders Placed This Encounter  Procedures  . [place] MFM OB COMP + 14 WK    Standing Status:   Future    Standing Expiration Date:   03/09/2021    Order Specific Question:    Reason for Exam (SYMPTOM  OR DIAGNOSIS REQUIRED)    Answer:   Anatomy    Order Specific Question:   Preferred Location    Answer:   [facility]  . Genetic Screening    Follow up in 4 weeks. 50% of 20 min visit spent on counseling and coordination of care.    Brock Bad, MD 03/09/2020 11:17 AM

## 2020-03-09 NOTE — Progress Notes (Signed)
Pt presents for NOB  Pt transferred from [facility] She c/o nausea No relief with Phenergan Pt is married. Not a planned pregnancy  Normal pap smear 01/28/2020

## 2020-03-21 ENCOUNTER — Encounter: Payer: Self-pay | Admitting: Obstetrics

## 2020-04-06 ENCOUNTER — Other Ambulatory Visit: Payer: Self-pay

## 2020-04-06 ENCOUNTER — Ambulatory Visit (INDEPENDENT_AMBULATORY_CARE_PROVIDER_SITE_OTHER): Payer: Medicaid Other | Admitting: Nurse Practitioner

## 2020-04-06 VITALS — BP 120/74 | HR 88 | Wt 178.0 lb

## 2020-04-06 DIAGNOSIS — G43009 Migraine without aura, not intractable, without status migrainosus: Secondary | ICD-10-CM

## 2020-04-06 DIAGNOSIS — R102 Pelvic and perineal pain: Secondary | ICD-10-CM

## 2020-04-06 DIAGNOSIS — O26892 Other specified pregnancy related conditions, second trimester: Secondary | ICD-10-CM

## 2020-04-06 DIAGNOSIS — Z3480 Encounter for supervision of other normal pregnancy, unspecified trimester: Secondary | ICD-10-CM

## 2020-04-06 DIAGNOSIS — Z3A17 17 weeks gestation of pregnancy: Secondary | ICD-10-CM

## 2020-04-06 DIAGNOSIS — K117 Disturbances of salivary secretion: Secondary | ICD-10-CM

## 2020-04-06 NOTE — Progress Notes (Signed)
ROB   AFP Today Pt has had COVID vaccine  CC: Back pain/Hip pain pain radiates to the front of her belly.  Pt notes fatigue. BTL consent signed.

## 2020-04-07 NOTE — Progress Notes (Signed)
    Subjective:  Sharon Garner is a 29 y.o. [phone] at [redacted]w[redacted]d being seen today for ongoing prenatal care.  She is currently monitored for the following issues for this low-risk pregnancy and has Family history of mental retardation; Migraine headache without aura; History of C-section; Supervision of other normal pregnancy, antepartum; and Short interval between pregnancies affecting pregnancy in first trimester, antepartum on their problem list.  Patient reports pelvic pain and hip pain - having trouble getting out of bed.  Contractions: Not present. Vag. Bleeding: None.  Movement: Present. Denies leaking of fluid.   The following portions of the patient's history were reviewed and updated as appropriate: allergies, current medications, past family history, past medical history, past social history, past surgical history and problem list. Problem list updated.  Objective:   Vitals:   04/06/20 1022  BP: 120/74  Pulse: 88  Weight: 178 lb (80.7 kg)    Fetal Status: Fetal Heart Rate (bpm): 140 Fundal Height: 20 cm Movement: Present     General:  Alert, oriented and cooperative. Patient is in no acute distress.  Skin: Skin is warm and dry. No rash noted.   Cardiovascular: Normal heart rate noted  Respiratory: Normal respiratory effort, no problems with respiration noted  Abdomen: Soft, gravid, appropriate for gestational age. Pain/Pressure: Present     Pelvic:  Cervical exam deferred        Extremities: Normal range of motion.  Edema: None  Mental Status: Normal mood and affect. Normal behavior. Normal judgment and thought content.   Urinalysis:      Assessment and Plan:  Pregnancy: [id number] at [redacted]w[redacted]d  1. Supervision of other normal pregnancy, antepartum Has had 2 C/S due to breech - wants to consider TOLAC Will schedule to meet with MD later in pregnancy for a delivery plan Wants BTL - will have sign papers today  - AFP, Serum, Open Spina Bifida  2. Pelvic pain affecting  pregnancy in second trimester, antepartum Continues to have pelvic and hip pain - will refer for therapy  - Ambulatory referral to Physical Therapy  3. Ptyalism Not every day - sometimes has it No spitting observed in visit today Medication did not help  4. Migraine without aura and without status migrainosus, not intractable Is using Flexeril and Tylenol but not working well Has headache for up to 3 days at a time Needs to be in a dark room and sleep Has not gone to the [facility] as she thought they would only give Tylenol Advised there is a headache protocol with IVF and medications Drink at least 8 8-oz glasses of water every day. Eat protein at every meal to avoid hypoglycemia triggering a headache.  Preterm labor symptoms and general obstetric precautions including but not limited to vaginal bleeding, contractions, leaking of fluid and fetal movement were reviewed in detail with the patient. Please refer to After Visit Summary for other counseling recommendations.  Return in about 4 weeks (around 05/04/2020) for in person ROB.  Nolene Bernheim, RN, MSN, NP-BC Nurse Practitioner, [facility] for [employer], [facility] 04/07/2020 7:40 AM

## 2020-04-08 LAB — AFP, SERUM, OPEN SPINA BIFIDA
AFP MoM: 0.98
AFP Value: 34 ng/mL
Gest. Age on Collection Date: 17 weeks
Maternal Age At EDD: 30.2 yr
OSBR Risk 1 IN: 10000
Test Results:: NEGATIVE
Weight: 178 [lb_av]

## 2020-04-26 ENCOUNTER — Other Ambulatory Visit: Payer: Self-pay

## 2020-04-26 ENCOUNTER — Ambulatory Visit: Payer: Medicaid Other | Attending: Obstetrics

## 2020-04-26 ENCOUNTER — Other Ambulatory Visit: Payer: Self-pay | Admitting: Obstetrics

## 2020-04-26 DIAGNOSIS — Z3480 Encounter for supervision of other normal pregnancy, unspecified trimester: Secondary | ICD-10-CM

## 2020-04-27 ENCOUNTER — Other Ambulatory Visit: Payer: Self-pay | Admitting: *Deleted

## 2020-04-27 DIAGNOSIS — Z98891 History of uterine scar from previous surgery: Secondary | ICD-10-CM

## 2020-05-04 ENCOUNTER — Ambulatory Visit (INDEPENDENT_AMBULATORY_CARE_PROVIDER_SITE_OTHER): Payer: Medicaid Other | Admitting: Women's Health

## 2020-05-04 ENCOUNTER — Other Ambulatory Visit: Payer: Self-pay

## 2020-05-04 ENCOUNTER — Other Ambulatory Visit (HOSPITAL_COMMUNITY)
Admission: RE | Admit: 2020-05-04 | Discharge: 2020-05-04 | Disposition: A | Discharge status: Home / Self Care | Payer: Medicaid Other | Source: Ambulatory Visit | Attending: Women's Health | Admitting: Women's Health

## 2020-05-04 VITALS — BP 110/68 | HR 86 | Wt 180.0 lb

## 2020-05-04 DIAGNOSIS — N898 Other specified noninflammatory disorders of vagina: Secondary | ICD-10-CM | POA: Diagnosis present

## 2020-05-04 DIAGNOSIS — O99891 Other specified diseases and conditions complicating pregnancy: Secondary | ICD-10-CM | POA: Insufficient documentation

## 2020-05-04 DIAGNOSIS — N949 Unspecified condition associated with female genital organs and menstrual cycle: Secondary | ICD-10-CM | POA: Insufficient documentation

## 2020-05-04 DIAGNOSIS — Z3480 Encounter for supervision of other normal pregnancy, unspecified trimester: Secondary | ICD-10-CM

## 2020-05-04 DIAGNOSIS — M549 Dorsalgia, unspecified: Secondary | ICD-10-CM | POA: Insufficient documentation

## 2020-05-04 DIAGNOSIS — G43009 Migraine without aura, not intractable, without status migrainosus: Secondary | ICD-10-CM

## 2020-05-04 DIAGNOSIS — O26892 Other specified pregnancy related conditions, second trimester: Secondary | ICD-10-CM | POA: Diagnosis not present

## 2020-05-04 DIAGNOSIS — Z810 Family history of intellectual disabilities: Secondary | ICD-10-CM

## 2020-05-04 DIAGNOSIS — Z98891 History of uterine scar from previous surgery: Secondary | ICD-10-CM

## 2020-05-04 DIAGNOSIS — Z3A21 21 weeks gestation of pregnancy: Secondary | ICD-10-CM

## 2020-05-04 DIAGNOSIS — O99210 Obesity complicating pregnancy, unspecified trimester: Secondary | ICD-10-CM | POA: Insufficient documentation

## 2020-05-04 MED ORDER — CYCLOBENZAPRINE HCL 10 MG PO TABS: 10.0000 mg | ORAL_TABLET | Freq: Three times a day (TID) | ORAL | 1 refills | Status: DC | PRN

## 2020-05-04 MED ORDER — PRENATAL VITAMINS 28-0.8 MG PO TABS: 1.0000 | ORAL_TABLET | Freq: Every day | ORAL | 6 refills | Status: DC

## 2020-05-04 NOTE — Patient Instructions (Addendum)
Maternity Assessment Unit (MAU)  The Maternity Assessment Unit (MAU) is located at the [facility] and [place] [facility]. The address is: [address], [place], [place], [place] [address]. Please see map below for additional directions.    The Maternity Assessment Unit is designed to help you during your pregnancy, and for up to 6 weeks after delivery, with any pregnancy- or postpartum-related emergencies, if you think you are in labor, or if your water has broken. For example, if you experience nausea and vomiting, vaginal bleeding, severe abdominal or pelvic pain, elevated blood pressure or other problems related to your pregnancy or postpartum time, please come to the Maternity Assessment Unit for assistance.        Preterm Labor The normal length of a pregnancy is 39-41 weeks. Preterm labor is when labor starts before 37 completed weeks of pregnancy. Babies who are born prematurely and survive may not be fully developed and may be at an increased risk for long-term problems such as cerebral palsy, developmental delays, and vision and hearing problems. Babies who are born too early may have problems soon after birth. Problems may include regulating blood sugar, body temperature, heart rate, and breathing rate. These babies often have trouble with feeding. The risk of having problems is highest for babies who are born before 44 weeks of pregnancy. What are the causes? The exact cause of this condition is not known. What increases the risk? You are more likely to have preterm labor if you have certain risk factors that relate to your medical history, problems with present and past pregnancies, and lifestyle factors. Medical history  You have abnormalities of the uterus, including a short cervix.  You have STIs (sexually transmitted infections), or other infections of the urinary tract and the vagina.  You have chronic illnesses, such as blood clotting problems,  diabetes, or high blood pressure.  You are overweight or underweight. Present and past pregnancies  You have had preterm labor before.  You are pregnant with twins or other multiples.  You have been diagnosed with a condition in which the placenta covers your cervix (placenta previa).  You waited less than 6 months between giving birth and becoming pregnant again.  Your unborn baby has some abnormalities.  You have vaginal bleeding during pregnancy.  You became pregnant through in vitro fertilization (IVF). Lifestyle and environmental factors  You use tobacco products.  You drink alcohol.  You use street drugs.  You have stress and no social support.  You experience domestic violence.  You are exposed to certain chemicals or environmental pollutants. Other factors  You are younger than age 110 or older than age 19. What are the signs or symptoms? Symptoms of this condition include:  Cramps similar to those that can happen during a menstrual period. The cramps may happen with diarrhea.  Pain in the abdomen or lower back.  Regular contractions that may feel like tightening of the abdomen.  A feeling of increased pressure in the pelvis.  Increased watery or bloody mucus discharge from the vagina.  Water breaking (ruptured amniotic sac). How is this diagnosed? This condition is diagnosed based on:  Your medical history and a physical exam.  A pelvic exam.  An ultrasound.  Monitoring your uterus for contractions.  Other tests, including: ? A swab of the cervix to check for a chemical called fetal fibronectin. ? Urine tests. How is this treated? Treatment for this condition depends on the length of your pregnancy, your  condition, and the health of your baby. Treatment may include:  Taking medicines, such as: ? Hormone medicines. These may be given early in pregnancy to help support the pregnancy. ? Medicines to stop contractions. ? Medicines to help mature  the baby's lungs. These may be prescribed if the risk of delivery is high. ? Medicines to prevent your baby from developing cerebral palsy.  Bed rest. If the labor happens before 34 weeks of pregnancy, you may need to stay in the hospital.  Delivery of the baby. Follow these instructions at home:  Do not use any products that contain nicotine or tobacco, such as cigarettes, e-cigarettes, and chewing tobacco. If you need help quitting, ask your health care provider.  Do not drink alcohol.  Take over-the-counter and prescription medicines only as told by your health care provider.  Rest as told by your health care provider.  Return to your normal activities as told by your health care provider. Ask your health care provider what activities are safe for you.  Keep all follow-up visits as told by your health care provider. This is important.   How is this prevented? To increase your chance of having a full-term pregnancy:  Do not use street drugs or medicines that have not been prescribed to you during your pregnancy.  Talk with your health care provider before taking any herbal supplements, even if you have been taking them regularly.  Make sure you gain a healthy amount of weight during your pregnancy.  Watch for infection. If you think that you might have an infection, get it checked right away. Symptoms of infection may include: ? Fever. ? Abnormal vaginal discharge or discharge that smells bad. ? Pain or burning with urination. ? Needing to urinate urgently. ? Frequently urinating or passing small amounts of urine frequently. ? Blood in your urine. ? Urine that smells bad or unusual.  Tell your health care provider if you have had preterm labor before. Contact a health care provider if:  You think you are going into preterm labor.  You have signs or symptoms of preterm labor.  You have symptoms of infection. Get help right away if:  You are having regular, painful  contractions every 5 minutes or less.  Your water breaks. Summary  Preterm labor is labor that starts before you reach 37 weeks of pregnancy.  Delivering your baby early increases your baby's risk of developing lifelong problems.  The exact cause of preterm labor is unknown. However, having an abnormal uterus, an STI (sexually transmitted infection), or vaginal bleeding during pregnancy increases your risk for preterm labor.  Keep all follow-up visits as told by your health care provider. This is important.  Contact a health care provider if you have signs or symptoms of preterm labor. This information is not intended to replace advice given to you by your health care provider. Make sure you discuss any questions you have with your health care provider. Document Revised: 05/12/2019 Document Reviewed: 05/12/2019 Elsevier Patient Education  2021 Elsevier Inc.        Round Ligament Pain  The round ligament is a cord of muscle and tissue that helps support the uterus. It can become a source of pain during pregnancy if it becomes stretched or twisted as the baby grows. The pain usually begins in the second trimester (13-28 weeks) of pregnancy, and it can come and go until the baby is delivered. It is not a serious problem, and it does not cause harm to the  baby. Round ligament pain is usually a short, sharp, and pinching pain, but it can also be a dull, lingering, and aching pain. The pain is felt in the lower side of the abdomen or in the groin. It usually starts deep in the groin and moves up to the outside of the hip area. The pain may occur when you:  Suddenly change position, such as quickly going from a sitting to standing position.  Roll over in bed.  Cough or sneeze.  Do physical activity. Follow these instructions at home:  Watch your condition for any changes.  When the pain starts, relax. Then try any of these methods to help with the pain: ? Sitting down. ? Flexing  your knees up to your abdomen. ? Lying on your side with one pillow under your abdomen and another pillow between your legs. ? Sitting in a warm bath for 15-20 minutes or until the pain goes away.  Take over-the-counter and prescription medicines only as told by your health care provider.  Move slowly when you sit down or stand up.  Avoid long walks if they cause pain.  Stop or reduce your physical activities if they cause pain.  Keep all follow-up visits as told by your health care provider. This is important.   Contact a health care provider if:  Your pain does not go away with treatment.  You feel pain in your back that you did not have before.  Your medicine is not helping. Get help right away if:  You have a fever or chills.  You develop uterine contractions.  You have vaginal bleeding.  You have nausea or vomiting.  You have diarrhea.  You have pain when you urinate. Summary  Round ligament pain is felt in the lower abdomen or groin. It is usually a short, sharp, and pinching pain. It can also be a dull, lingering, and aching pain.  This pain usually begins in the second trimester (13-28 weeks). It occurs because the uterus is stretching with the growing baby, and it is not harmful to the baby.  You may notice the pain when you suddenly change position, when you cough or sneeze, or during physical activity.  Relaxing, flexing your knees to your abdomen, lying on one side, or taking a warm bath may help to get rid of the pain.  Get help from your health care provider if the pain does not go away or if you have vaginal bleeding, nausea, vomiting, diarrhea, or painful urination. This information is not intended to replace advice given to you by your health care provider. Make sure you discuss any questions you have with your health care provider. Document Revised: 09/25/2017 Document Reviewed: 09/25/2017 Elsevier Patient Education  2021 Elsevier  Inc.        Back Pain in Pregnancy Back pain during pregnancy is common. Back pain may be caused by several factors that are related to changes during your pregnancy. Follow these instructions at home: Managing pain, stiffness, and swelling  If directed, for sudden (acute) back pain, put ice on the painful area. ? Put ice in a plastic bag. ? Place a towel between your skin and the bag. ? Leave the ice on for 20 minutes, 2-3 times per day.  If directed, apply heat to the affected area before you exercise. Use the heat source that your health care provider recommends, such as a moist heat pack or a heating pad. ? Place a towel between your skin and the heat source. ?  Leave the heat on for 20-30 minutes. ? Remove the heat if your skin turns bright red. This is especially important if you are unable to feel pain, heat, or cold. You may have a greater risk of getting burned.  If directed, massage the affected area.      Activity  Exercise as told by your health care provider. Gentle exercise is the best way to prevent or manage back pain.  Listen to your body when lifting. If lifting hurts, ask for help or bend your knees. This uses your leg muscles instead of your back muscles.  Squat down when picking up something from the floor. Do not bend over.  Only use bed rest for short periods as told by your health care provider. Bed rest should only be used for the most severe episodes of back pain. Standing, sitting, and lying down  Do not stand in one place for long periods of time.  Use good posture when sitting. Make sure your head rests over your shoulders and is not hanging forward. Use a pillow on your lower back if necessary.  Try sleeping on your side, preferably the left side, with a pregnancy support pillow or 1-2 regular pillows between your legs. ? If you have back pain after a night's rest, your bed may be too soft. ? A firm mattress may provide more support for your  back during pregnancy. General instructions  Do not wear high heels.  Eat a healthy diet. Try to gain weight within your health care provider's recommendations.  Use a maternity girdle, elastic sling, or back brace as told by your health care provider.  Take over-the-counter and prescription medicines only as told by your health care provider.  Work with a physical therapist or massage therapist to find ways to manage back pain. Acupuncture or massage therapy may be helpful.  Keep all follow-up visits as told by your health care provider. This is important. Contact a health care provider if:  Your back pain interferes with your daily activities.  You have increasing pain in other parts of your body. Get help right away if:  You develop numbness, tingling, weakness, or problems with the use of your arms or legs.  You develop severe back pain that is not controlled with medicine.  You have a change in bowel or bladder control.  You develop shortness of breath, dizziness, or you faint.  You develop nausea, vomiting, or sweating.  You have back pain that is a rhythmic, cramping pain similar to labor pains. Labor pain is usually 1-2 minutes apart, lasts for about 1 minute, and involves a bearing down feeling or pressure in your pelvis.  You have back pain and your water breaks or you have vaginal bleeding.  You have back pain or numbness that travels down your leg.  Your back pain developed after you fell.  You develop pain on one side of your back.  You see blood in your urine.  You develop skin blisters in the area of your back pain. Summary  Back pain may be caused by several factors that are related to changes during your pregnancy.  Follow instructions as told by your health care provider for managing pain, stiffness, and swelling.  Exercise as told by your health care provider. Gentle exercise is the best way to prevent or manage back pain.  Take over-the-counter  and prescription medicines only as told by your health care provider.  Keep all follow-up visits as told by your health  care provider. This is important. This information is not intended to replace advice given to you by your health care provider. Make sure you discuss any questions you have with your health care provider. Document Revised: 07/29/2018 Document Reviewed: 09/25/2017 Elsevier Patient Education  2021 Elsevier Inc.        Round Ligament Pain  The round ligament is a cord of muscle and tissue that helps support the uterus. It can become a source of pain during pregnancy if it becomes stretched or twisted as the baby grows. The pain usually begins in the second trimester (13-28 weeks) of pregnancy, and it can come and go until the baby is delivered. It is not a serious problem, and it does not cause harm to the baby. Round ligament pain is usually a short, sharp, and pinching pain, but it can also be a dull, lingering, and aching pain. The pain is felt in the lower side of the abdomen or in the groin. It usually starts deep in the groin and moves up to the outside of the hip area. The pain may occur when you:  Suddenly change position, such as quickly going from a sitting to standing position.  Roll over in bed.  Cough or sneeze.  Do physical activity. Follow these instructions at home:  Watch your condition for any changes.  When the pain starts, relax. Then try any of these methods to help with the pain: ? Sitting down. ? Flexing your knees up to your abdomen. ? Lying on your side with one pillow under your abdomen and another pillow between your legs. ? Sitting in a warm bath for 15-20 minutes or until the pain goes away.  Take over-the-counter and prescription medicines only as told by your health care provider.  Move slowly when you sit down or stand up.  Avoid long walks if they cause pain.  Stop or reduce your physical activities if they cause pain.  Keep  all follow-up visits as told by your health care provider. This is important.   Contact a health care provider if:  Your pain does not go away with treatment.  You feel pain in your back that you did not have before.  Your medicine is not helping. Get help right away if:  You have a fever or chills.  You develop uterine contractions.  You have vaginal bleeding.  You have nausea or vomiting.  You have diarrhea.  You have pain when you urinate. Summary  Round ligament pain is felt in the lower abdomen or groin. It is usually a short, sharp, and pinching pain. It can also be a dull, lingering, and aching pain.  This pain usually begins in the second trimester (13-28 weeks). It occurs because the uterus is stretching with the growing baby, and it is not harmful to the baby.  You may notice the pain when you suddenly change position, when you cough or sneeze, or during physical activity.  Relaxing, flexing your knees to your abdomen, lying on one side, or taking a warm bath may help to get rid of the pain.  Get help from your health care provider if the pain does not go away or if you have vaginal bleeding, nausea, vomiting, diarrhea, or painful urination. This information is not intended to replace advice given to you by your health care provider. Make sure you discuss any questions you have with your health care provider. Document Revised: 09/25/2017 Document Reviewed: 09/25/2017 Elsevier Patient Education  2021 [employer].

## 2020-05-04 NOTE — Progress Notes (Signed)
Subjective:  Sharon Garner is a 30 y.o. [phone] at [redacted]w[redacted]d being seen today for ongoing prenatal care.  She is currently monitored for the following issues for this low-risk pregnancy and has Family history of intellectual disability; Migraine headache without aura; History of C-section; Supervision of other normal pregnancy, antepartum; Short interval between pregnancies affecting pregnancy in first trimester, antepartum; Round ligament pain; Back pain in pregnancy; and Obesity in pregnancy on their problem list.  Patient reports on-going back pain and white vaginal discharge that is sometimes clumped..  Contractions: Not present. Vag. Bleeding: None.  Movement: Present. Denies leaking of fluid.   The following portions of the patient's history were reviewed and updated as appropriate: allergies, current medications, past family history, past medical history, past social history, past surgical history and problem list. Problem list updated.  Objective:   Vitals:   05/04/20 1028  BP: 110/68  Pulse: 86  Weight: 180 lb (81.6 kg)    Fetal Status: Fetal Heart Rate (bpm): 145   Movement: Present     General:  Alert, oriented and cooperative. Patient is in no acute distress.  Skin: Skin is warm and dry. No rash noted.   Cardiovascular: Normal heart rate noted  Respiratory: Normal respiratory effort, no problems with respiration noted  Abdomen: Soft, gravid, appropriate for gestational age. Pain/Pressure: Present     Pelvic: Vag. Bleeding: None Vag D/C Character: Curdy   Cervical exam deferred        Extremities: Normal range of motion.  Edema: None  Mental Status: Normal mood and affect. Normal behavior. Normal judgment and thought content.   Urinalysis:      Assessment and Plan:  Pregnancy: [id number] at [redacted]w[redacted]d  1. [redacted] weeks gestation of pregnancy  2. Supervision of other normal pregnancy, antepartum -f/u anatomy US 05/31/2020 -[facility] today - pt did not have drawn with [facility] as  was not marked on sheet to draw, pt requests today - Genetic Screening - Prenatal Vit-Fe Fumarate-FA (PRENATAL VITAMINS) 28-0.8 MG TABS; Take 1 tablet by mouth daily.  Dispense: 30 tablet; Refill: 6  3. History of C-section -x2, will need repeat  4. Family history of intellectual disability - Genetic Screening  5. Migraine without aura and without status migrainosus, not intractable -no concerns today -referral to [facility] clinic  6. Obesity in pregnancy - HgB A1c  7. Vaginal discharge during pregnancy in second trimester - Cervicovaginal ancillary only( [place])  8. Back pain in pregnancy - pt reports was referred to PT and is scheduled for first visit on 05/10/2020 - discussed Tylenol, Flexeril, pregnancy support belt, chiropractic care, massage therapy, info given - cyclobenzaprine (FLEXERIL) 10 MG tablet; Take 1 tablet (10 mg total) by mouth every 8 (eight) hours as needed for muscle spasms.  Dispense: 30 tablet; Refill: 1  9. Round ligament pain -reviewed s/sx of RLP vs PTL  Preterm labor symptoms and general obstetric precautions including but not limited to vaginal bleeding, contractions, leaking of fluid and fetal movement were reviewed in detail with the patient. I discussed the assessment and treatment plan with the patient. The patient was provided an opportunity to ask questions and all were answered. The patient agreed with the plan and demonstrated an understanding of the instructions. The patient was advised to call back or seek an in-person office evaluation/go to [facility] at [facility] for any urgent or concerning symptoms. Please refer to After Visit Summary for other counseling recommendations.  Return in about 4 weeks (around 06/01/2020) for in-person  LOB/APP OK (pt does not have BP cuff at home).   Nariya Neumeyer, Odie Sera, NP

## 2020-05-04 NOTE — Progress Notes (Addendum)
ROB c/o white cottage cheese like discharge, L side pain 8/10 x 1 month.  Denies itching, burning. Self swab done.  BTS signed today.

## 2020-05-05 LAB — CERVICOVAGINAL ANCILLARY ONLY
Candida Glabrata: NEGATIVE
Candida Vaginitis: NEGATIVE
Chlamydia: NEGATIVE
Comment: NEGATIVE
Comment: NEGATIVE
Comment: NEGATIVE
Comment: NEGATIVE
Comment: NORMAL
Neisseria Gonorrhea: NEGATIVE
Trichomonas: NEGATIVE

## 2020-05-05 LAB — HEMOGLOBIN A1C
Est. average glucose Bld gHb Est-mCnc: 114 mg/dL
Hgb A1c MFr Bld: 5.6 % (ref 4.8–5.6)

## 2020-05-10 ENCOUNTER — Other Ambulatory Visit: Payer: Self-pay

## 2020-05-10 ENCOUNTER — Encounter: Payer: Medicaid Other | Attending: Women's Health | Admitting: Physical Therapy

## 2020-05-10 ENCOUNTER — Encounter: Payer: Self-pay | Admitting: Physical Therapy

## 2020-05-10 DIAGNOSIS — R252 Cramp and spasm: Secondary | ICD-10-CM | POA: Insufficient documentation

## 2020-05-10 DIAGNOSIS — M545 Low back pain, unspecified: Secondary | ICD-10-CM | POA: Diagnosis not present

## 2020-05-10 DIAGNOSIS — M6281 Muscle weakness (generalized): Secondary | ICD-10-CM | POA: Diagnosis present

## 2020-05-10 NOTE — Therapy (Signed)
[facility] [address], [address] [place], [place], [address] Phone: [phone]   Fax:  [phone]  Physical Therapy Evaluation  Patient Details  Name: Sharon Garner MRN: [id number] Date of Birth: [birth year] Referring Provider (PT): Nolene Bernheim, NP   Encounter Date: 05/10/2020   PT End of Session - 05/10/20 1438    Visit Number 1    Date for PT Re-Evaluation 08/02/20    Authorization Type UHC Medicaid    PT Start Time 1347    PT Stop Time 1425    PT Time Calculation (min) 38 min    Activity Tolerance Patient tolerated treatment well;No increased pain    Behavior During Therapy WFL for tasks assessed/performed           Past Medical History:  Diagnosis Date  . Migraine   . Ovarian cyst     Past Surgical History:  Procedure Laterality Date  . CESAREAN SECTION N/A 10/17/2012   Procedure: CESAREAN SECTION;  Surgeon: Tereso Newcomer, MD;  Location: [facility];  Service: Obstetrics;  Laterality: N/A;  . CESAREAN SECTION N/A 05/11/2019   Procedure: CESAREAN SECTION;  Surgeon: Conan Bowens, MD;  Location: [facility];  Service: Obstetrics;  Laterality: N/A;    There were no vitals filed for this visit.    Subjective Assessment - 05/10/20 1350    Subjective Patient reports back pain 3 months ago. when bends forward ahd to hold onto something. the sacrum pops. Patient is due 09/12/2020    Patient Stated Goals reduce pain    Currently in Pain? Yes    Pain Score 8     Pain Location Sacrum    Pain Orientation Mid    Pain Descriptors / Indicators Sharp;Other (Comment)   popping   Pain Type Acute pain    Pain Onset More than a month ago    Pain Frequency Intermittent    Aggravating Factors  bending further, laying on the floor on her back, sitting too long needs help to get up    Pain Relieving Factors walking, sit with pillow in low back    Multiple Pain Sites No              [facility] PT Assessment - 05/10/20  0001      Assessment   Medical Diagnosis O26.892, R10.2 Pelvic pain affecting prenanacy in second trimester, antepartum    Referring Provider (PT) Nolene Bernheim, NP    Onset Date/Surgical Date 01/22/20    Prior Therapy None      Precautions   Precautions Other (comment)    Precaution Comments pregnant      Restrictions   Weight Bearing Restrictions No      Balance Screen   Has the patient fallen in the past 6 months No    Has the patient had a decrease in activity level because of a fear of falling?  No    Is the patient reluctant to leave their home because of a fear of falling?  No      Home [employer]ist information centre manager residence      Prior Function   Level of Independence Independent    Leisure walking      Cognition   Overall Cognitive Status Within Functional Limits for tasks assessed      Posture/Postural Control   Posture/Postural Control Postural limitations    Postural Limitations Increased lumbar lordosis    Posture Comments pregnant posture  ROM / Strength   AROM / PROM / Strength AROM;PROM;Strength      AROM   Lumbar Flexion decreased by 25%    Lumbar - Left Side Bend decreased by 25%      Strength   Overall Strength Comments Lef thip abduction and extension 4/5      Palpation   Spinal mobility Decreased movememtn of left side of L1-L5    SI assessment  Lef tilium rotated anteriorly and shallow    Palpation comment tenderness located in left gluteal and piriformis, left paraspinals,                      Objective measurements completed on examination: See above findings.     Pelvic Floor Special Questions - 05/10/20 0001    Prior Pregnancies Yes    Number of Pregnancies 2   presently pregnant   Number of C-Sections 2    Currently Sexually Active Yes    Is this Painful Yes   sacral area   Urinary Leakage No    Fecal incontinence No            [facility] Adult PT Treatment/Exercise - 05/10/20 0001      Lumbar  Exercises: Stretches   Piriformis Stretch Right;Left;1 rep;30 seconds    Piriformis Stretch Limitations sitting      Lumbar Exercises: Quadruped   Madcat/Old Horse 15 reps    Madcat/Old Horse Limitations then swaying of hips 10x      Manual Therapy   Manual Therapy Joint mobilization;Soft tissue mobilization;Muscle Energy Technique    Joint Mobilization Rotational mobilization to L1-L5, sacral mobilization    Soft tissue mobilization using the addaday to the left lumbar paraspinals, gluteal    Muscle Energy Technique correct left ilium                  PT Education - 05/10/20 1435    Education Details Access Code: [email or web address]    Person(s) Educated Patient    Methods Explanation;Demonstration;Verbal cues;Handout    Comprehension Returned demonstration;Verbalized understanding            PT Short Term Goals - 05/10/20 1448      PT SHORT TERM GOAL #1   Title independent with lumbar stretches    Baseline not educated yet    Time 4    Period Weeks    Status New    Target Date 06/07/20             PT Long Term Goals - 05/10/20 1449      PT LONG TERM GOAL #1   Title independent with back stretches and gentle strengthening as her pregnancy progresses    Baseline not educated yet    Time 12    Period Weeks    Status New    Target Date 08/02/20      PT LONG TERM GOAL #2   Title able to bend forward without needing assistance to get back in neutral due to pain decreased </=  4/10    Baseline pain level 8/10    Time 12    Period Weeks    Status New    Target Date 08/02/20      PT LONG TERM GOAL #3   Title understand correct body mechanics to reduce strain on back and pelvis as her pregnancy progresses    Baseline not educated yet    Time 12    Period Weeks    Status New  Target Date 08/02/20                  Plan - 05/10/20 1436    Clinical Impression Statement Patient is a 30 year old female with sudden onset of left lumbar pain at level 8/10  when she bends foward, sitts too long or lay flat on her back. Patinet is due 09/12/2020. Patinet last child was born on 05/11/2019 by c-section. Patient will hear a popping sound when she leans forward. Lumbar ROM is limited for flexion and left rotation. Left ilium is rotated anteriorly and sacrum rotated left. Left hip abduction and extension 4/5. Patient has decreased movement of L1-L5 and stuck in extension for left side of vertebrae. Tenderness located in the left lumbar, left gluteal and piriformis. Patient felt better with manual work to the pelvis and lumbar. Patient will benefit f4rom skilled therapy to reduce her pain and strengthen to improve the function as her pregnanacy continues.    Personal Factors and Comorbidities Comorbidity 1;Fitness;Past/Current Experience    Comorbidities C-section x2    Examination-Activity Limitations Sit;Bend;Sleep;Lift;Transfers    Examination-Participation Restrictions Cleaning;Interpersonal Relationship;Meal Prep    Stability/Clinical Decision Making Stable/Uncomplicated    Clinical Decision Making Low    Rehab Potential Good    PT Frequency 1x / week    PT Duration 12 weeks    PT Treatment/Interventions ADLs/Self Care Home Management;Cryotherapy;Moist Heat;Therapeutic activities;Therapeutic exercise;Manual techniques;Patient/family education;Neuromuscular re-education;Passive range of motion;Taping;Spinal Manipulations    PT Next Visit Plan check pelvic alignement, mobilize the lumbar, sidely iliacus pull on left, sidely trunk rotation, gluteal squeezes, stand Pallof, body mechanics    PT Home Exercise Plan Access Code: [email or web address]    Consulted and Agree with Plan of Care Patient           Patient will benefit from skilled therapeutic intervention in order to improve the following deficits and impairments:  Pain,Increased fascial restricitons,Decreased activity tolerance,Increased muscle spasms,Impaired flexibility,Decreased range of motion,Decreased  strength  Visit Diagnosis: Acute left-sided low back pain without sciatica - Plan: PT plan of care cert/re-cert  Cramp and spasm - Plan: PT plan of care cert/re-cert  Muscle weakness (generalized) - Plan: PT plan of care cert/re-cert     Problem List Patient Active Problem List   Diagnosis Date Noted  . Round ligament pain 05/04/2020  . Back pain in pregnancy 05/04/2020  . Obesity in pregnancy 05/04/2020  . Supervision of other normal pregnancy, antepartum 01/28/2020  . Short interval between pregnancies affecting pregnancy in first trimester, antepartum 01/28/2020  . History of C-section 05/11/2019  . Family history of intellectual disability 05/16/2012  . Migraine headache without aura 11/09/2011    Eulis Foster, PT 05/10/20 2:54 PM   [place] [facility] [address], [address] [place], [place], [address] Phone: [phone]   Fax:  [phone]  Name: Sharon Garner MRN: [id number] Date of Birth: [birth year]

## 2020-05-10 NOTE — Patient Instructions (Signed)
Access Code: [email or web address] URL: [email or web address] Date: 05/10/2020 Prepared by: Eulis Foster  Exercises Seated Piriformis Stretch - 1 x daily - 7 x weekly - 1 sets - 2 reps - 30 sec hold Cat-Camel - 2 x daily - 7 x weekly - 1 sets - 10 reps [facility] [address], [address] [place], [place] [address] Phone # [phone] Fax [phone]

## 2020-05-11 ENCOUNTER — Encounter: Payer: Self-pay | Admitting: Women's Health

## 2020-05-17 ENCOUNTER — Encounter: Payer: Medicaid Other | Admitting: Physical Therapy

## 2020-05-18 ENCOUNTER — Telehealth: Payer: Self-pay | Admitting: Radiology

## 2020-05-18 NOTE — Telephone Encounter (Signed)
Left message for patient to call [facility] to schedule New Headache appointment with Nada Maclachlan from referral, also sent mychart message.

## 2020-05-24 ENCOUNTER — Encounter: Payer: Self-pay | Admitting: Physical Therapy

## 2020-05-24 ENCOUNTER — Other Ambulatory Visit: Payer: Self-pay

## 2020-05-24 ENCOUNTER — Encounter: Payer: Medicaid Other | Attending: Women's Health | Admitting: Physical Therapy

## 2020-05-24 DIAGNOSIS — R252 Cramp and spasm: Secondary | ICD-10-CM

## 2020-05-24 DIAGNOSIS — M6281 Muscle weakness (generalized): Secondary | ICD-10-CM

## 2020-05-24 DIAGNOSIS — M545 Low back pain, unspecified: Secondary | ICD-10-CM

## 2020-05-24 NOTE — Patient Instructions (Signed)
Access Code: [email or web address] URL: [email or web address] Date: 05/24/2020 Prepared by: Eulis Foster  Exercises Seated Piriformis Stretch - 1 x daily - 7 x weekly - 1 sets - 2 reps - 30 sec hold Cat-Camel - 2 x daily - 7 x weekly - 1 sets - 10 reps Standing Gastroc Stretch - 1 x daily - 7 x weekly - 1 sets - 2 reps - 30 sec hold Standing Hip Extension with Chair - 1 x daily - 7 x weekly - 2 sets - 10 reps Mini Squat with Counter Support - 1 x daily - 7 x weekly - 1 sets - 5 reps - 5 sec hold Standing Scapular Retraction - 1 x daily - 7 x weekly - 1 sets - 10 reps Sidelying Thoracic and Shoulder Rotation - 1 x daily - 7 x weekly - 1 sets - 10 reps Eulis Foster, PT [facility] [address] [place], [place] [address] W: [phone] [email or web address]

## 2020-05-24 NOTE — Therapy (Addendum)
[place] [facility] [address], [address] [place], [place], [address] Phone: [phone]   Fax:  [phone]  Physical Therapy Treatment  Patient Details  Name: Sharon Garner MRN: [id number] Date of Birth: 11/04/1990 Referring Provider (PT): Terri Burleson, NP   Encounter Date: 05/24/2020   PT End of Session - 05/24/20 0914    Visit Number 2    Date for PT Re-Evaluation 08/02/20    Authorization Type UHC Medicaid    Authorization - Visit Number 2    Authorization - Number of Visits 27    PT Start Time 0830    PT Stop Time 0910    PT Time Calculation (min) 40 min    Activity Tolerance Patient tolerated treatment well;No increased pain    Behavior During Therapy WFL for tasks assessed/performed           Past Medical History:  Diagnosis Date  . Migraine   . Ovarian cyst     Past Surgical History:  Procedure Laterality Date  . CESAREAN SECTION N/A 10/17/2012   Procedure: CESAREAN SECTION;  Surgeon: Ugonna A Anyanwu, MD;  Location: [facility];  Service: Obstetrics;  Laterality: N/A;  . CESAREAN SECTION N/A 05/11/2019   Procedure: CESAREAN SECTION;  Surgeon: Davis, Kelly M, MD;  Location: [facility];  Service: Obstetrics;  Laterality: N/A;    There were no vitals filed for this visit.   Subjective Assessment - 05/24/20 0834    Subjective I stillhave pain in the back. I felt good after the eval but only lasted several days.    Patient Stated Goals reduce pain    Currently in Pain? Yes    Pain Score 6     Pain Location Back    Pain Orientation Mid    Pain Descriptors / Indicators Sharp;Other (Comment)   popping   Pain Type Acute pain    Pain Onset More than a month ago    Pain Frequency Intermittent    Aggravating Factors  bending further, laying on the flloor on her back, sitting too long needs help to get up    Pain Relieving Factors walking, sit with pillow in low back    Multiple Pain Sites No                              [facility] Adult PT Treatment/Exercise - 05/24/20 0001      Self-Care   Self-Care Other Self-Care Comments    Other Self-Care Comments  educated patient onhow to put the maternity SI belt on correctly and she was ble to do it on her won      Lumbar Exercises: Sidelying   Other Sidelying Lumbar Exercises thoracic rotation to open rib cage    Other Sidelying Lumbar Exercises iliacus pull backs in right sidely wit hfoam roll between knees      Knee/Hip Exercises: Standing   Hip Extension Stengthening;1 set;10 reps    Extension Limitations while pushing the stroller to engage the gluteals    Functional Squat 1 set;5 reps;5 seconds    Functional Squat Limitations while holding the stroller, post. pelvic tilt with engagement of the lower abdominals    Other Standing Knee Exercises scapula retraction as she pulls the stroller back holding for 3 seconds and not elevating the shoulders      Manual Therapy   Manual Therapy Muscle Energy Technique;Joint mobilization    Joint Mobilization sidely rotation mobs to   L2-L5 and gapping of left facets    Soft tissue mobilization using the addaday to the left lumbar paraspinals, gluteal    Muscle Energy Technique correct left ilium                  PT Education - 05/24/20 0913    Education Details Access Code: [email or web address]    Person(s) Educated Patient    Methods Explanation;Demonstration;Verbal cues;Handout    Comprehension Returned demonstration;Verbalized understanding            PT Short Term Goals - 05/10/20 1448      PT SHORT TERM GOAL #1   Title independent with lumbar stretches    Baseline not educated yet    Time 4    Period Weeks    Status New    Target Date 06/07/20             PT Long Term Goals - 05/10/20 1449      PT LONG TERM GOAL #1   Title independent with back stretches and gentle strengthening as her pregnancy progresses    Baseline not educated yet    Time 12    Period  Weeks    Status New    Target Date 08/02/20      PT LONG TERM GOAL #2   Title able to bend forward without needing assistance to get back in neutral due to pain decreased </=  4/10    Baseline pain level 8/10    Time 12    Period Weeks    Status New    Target Date 08/02/20      PT LONG TERM GOAL #3   Title understand correct body mechanics to reduce strain on back and pelvis as her pregnancy progresses    Baseline not educated yet    Time 12    Period Weeks    Status New    Target Date 08/02/20                 Plan - 05/24/20 0858    Clinical Impression Statement Patient felt good several days after last visit. Patient pain today is 6/10 instead of 8/10. After manual work her pelvis in correct lignment. Patient understands how to put the SI belt on correctly and she feels a difference with it on. Patient had improved mobility of the lumbar vertebrae after manual work. Patient had a c-section in the past so she will be educated on c-section scar management for the next birth. Patient will benefit from skilled therapy to reduce her pain and strengthen to improve the function as her pregnancy continues.    Personal Factors and Comorbidities Comorbidity 1;Fitness;Past/Current Experience    Comorbidities C-section x2    Examination-Activity Limitations Sit;Bend;Sleep;Lift;Transfers    Examination-Participation Restrictions Cleaning;Interpersonal Relationship;Meal Prep    Stability/Clinical Decision Making Stable/Uncomplicated    Rehab Potential Good    PT Frequency 1x / week    PT Duration 12 weeks    PT Treatment/Interventions ADLs/Self Care Home Management;Cryotherapy;Moist Heat;Therapeutic activities;Therapeutic exercise;Manual techniques;Patient/family education;Neuromuscular re-education;Passive range of motion;Taping;Spinal Manipulations    PT Next Visit Plan pallof with red band attached to stroller; check pelvic alignment; c-section scar management; body mechanics, manual  work    PT Home Exercise Plan Access Code: [email or web address]    Recommended Other Services MD signed initial eval    Consulted and Agree with Plan of Care Patient           Patient will benefit from skilled therapeutic intervention in   order to improve the following deficits and impairments:  Pain,Increased fascial restricitons,Decreased activity tolerance,Increased muscle spasms,Impaired flexibility,Decreased range of motion,Decreased strength  Visit Diagnosis: Acute left-sided low back pain without sciatica  Cramp and spasm  Muscle weakness (generalized)     Problem List Patient Active Problem List   Diagnosis Date Noted  . Round ligament pain 05/04/2020  . Back pain in pregnancy 05/04/2020  . Obesity in pregnancy 05/04/2020  . Supervision of other normal pregnancy, antepartum 01/28/2020  . Short interval between pregnancies affecting pregnancy in first trimester, antepartum 01/28/2020  . History of C-section 05/11/2019  . Family history of intellectual disability 05/16/2012  . Migraine headache without aura 11/09/2011    Cheryl Gray, PT 05/24/20 9:22 AM   [place] [facility] [address], [address] [place], [place], [address] Phone: [phone]   Fax:  [phone]  Name: Sharon Garner MRN: [id number] Date of Birth: 07/10/1990  PHYSICAL THERAPY DISCHARGE SUMMARY  Visits from Start of Care: 2  Current functional level related to goals / functional outcomes: See above.    Remaining deficits: See above. Patient has not returned since her last attended visit. She has no-showed and canceled several visits since 05/24/2020. Due to our attendance policy she will be discharged.    Education / Equipment: HEP Plan:                                                    Patient goals were not met. Patient is being discharged due to not returning since the last visit.  Thank you for the referral. Cheryl Gray, PT 06/21/20 9:08  AM  ?????      

## 2020-05-31 ENCOUNTER — Encounter: Payer: Medicaid Other | Admitting: Physical Therapy

## 2020-05-31 ENCOUNTER — Ambulatory Visit: Payer: Medicaid Other

## 2020-06-01 ENCOUNTER — Other Ambulatory Visit: Payer: Self-pay

## 2020-06-01 ENCOUNTER — Encounter: Payer: Self-pay | Admitting: Obstetrics and Gynecology

## 2020-06-01 ENCOUNTER — Ambulatory Visit (INDEPENDENT_AMBULATORY_CARE_PROVIDER_SITE_OTHER): Payer: Medicaid Other | Admitting: Obstetrics and Gynecology

## 2020-06-01 ENCOUNTER — Other Ambulatory Visit (HOSPITAL_COMMUNITY)
Admission: RE | Admit: 2020-06-01 | Discharge: 2020-06-01 | Disposition: A | Discharge status: Home / Self Care | Payer: Medicaid Other | Source: Ambulatory Visit | Attending: Obstetrics and Gynecology | Admitting: Obstetrics and Gynecology

## 2020-06-01 VITALS — BP 105/69 | HR 95 | Wt 181.0 lb

## 2020-06-01 DIAGNOSIS — Z98891 History of uterine scar from previous surgery: Secondary | ICD-10-CM | POA: Insufficient documentation

## 2020-06-01 DIAGNOSIS — O26892 Other specified pregnancy related conditions, second trimester: Secondary | ICD-10-CM

## 2020-06-01 DIAGNOSIS — N898 Other specified noninflammatory disorders of vagina: Secondary | ICD-10-CM

## 2020-06-01 DIAGNOSIS — M549 Dorsalgia, unspecified: Secondary | ICD-10-CM

## 2020-06-01 DIAGNOSIS — Z3480 Encounter for supervision of other normal pregnancy, unspecified trimester: Secondary | ICD-10-CM

## 2020-06-01 DIAGNOSIS — Z3A25 25 weeks gestation of pregnancy: Secondary | ICD-10-CM

## 2020-06-01 DIAGNOSIS — Z3009 Encounter for other general counseling and advice on contraception: Secondary | ICD-10-CM | POA: Insufficient documentation

## 2020-06-01 DIAGNOSIS — O99210 Obesity complicating pregnancy, unspecified trimester: Secondary | ICD-10-CM

## 2020-06-01 DIAGNOSIS — O99891 Other specified diseases and conditions complicating pregnancy: Secondary | ICD-10-CM

## 2020-06-01 LAB — POCT URINALYSIS DIPSTICK
Bilirubin, UA: NEGATIVE
Blood, UA: NEGATIVE
Glucose, UA: NEGATIVE
Ketones, UA: NEGATIVE
Leukocytes, UA: NEGATIVE
Nitrite, UA: NEGATIVE
Protein, UA: NEGATIVE
Spec Grav, UA: 1.015 (ref 1.010–1.025)
Urobilinogen, UA: 0.2 E.U./dL
pH, UA: 5 (ref 5.0–8.0)

## 2020-06-01 NOTE — Progress Notes (Signed)
ROB c/o back pain 7/10, spotting x 2 days.

## 2020-06-01 NOTE — Addendum Note (Signed)
Addended by: Maretta Bees on: 06/01/2020 11:31 AM   Modules accepted: Orders

## 2020-06-01 NOTE — Progress Notes (Signed)
   PRENATAL VISIT NOTE  Subjective:  Sharon Garner is a 30 y.o. [phone] at [redacted]w[redacted]d being seen today for ongoing prenatal care.  She is currently monitored for the following issues for this high-risk pregnancy and has Family history of intellectual disability; Migraine headache without aura; History of C-section; Supervision of other normal pregnancy, antepartum; Short interval between pregnancies affecting pregnancy in first trimester, antepartum; Round ligament pain; Back pain in pregnancy; Obesity in pregnancy; and Unwanted fertility on their problem list.  Patient reports vaginal pressure and pain, reports spotting when baby kicks her really hard. Painful pressure for the last 4 days, has happened 2-4 times per day..  Contractions: Irritability. Vag. Bleeding: None,Scant.  Movement: Present. Denies leaking of fluid.   The following portions of the patient's history were reviewed and updated as appropriate: allergies, current medications, past family history, past medical history, past social history, past surgical history and problem list.   Objective:   Vitals:   06/01/20 0848  BP: 105/69  Pulse: 95  Weight: 181 lb (82.1 kg)    Fetal Status: Fetal Heart Rate (bpm): 146   Movement: Present     General:  Alert, oriented and cooperative. Patient is in no acute distress.  Skin: Skin is warm and dry. No rash noted.   Cardiovascular: Normal heart rate noted  Respiratory: Normal respiratory effort, no problems with respiration noted  Abdomen: Soft, gravid, appropriate for gestational age.  Pain/Pressure: Present     Pelvic: Cervical exam deferred      SSE: closed, scant white discharge, no bleeding noted  Extremities: Normal range of motion.  Edema: Trace  Mental Status: Normal mood and affect. Normal behavior. Normal judgment and thought content.   Assessment and Plan:  Pregnancy: [id number] at [redacted]w[redacted]d  1. History of C-section Will schedule repeat today Would like TOLAC if she  labors spontaneously  2. Supervision of other normal pregnancy, antepartum  3. Back pain in pregnancy/spotting - vaginal pressure - no obvious source on exam - wet prep today - urine dip  4. Obesity in pregnancy  5. [redacted] weeks gestation of pregnanc  6. Unwanted fertility BTL papers signed previously   Preterm labor symptoms and general obstetric precautions including but not limited to vaginal bleeding, contractions, leaking of fluid and fetal movement were reviewed in detail with the patient. Please refer to After Visit Summary for other counseling recommendations.   Return in about 2 weeks (around 06/15/2020) for high OB, in person, 2 hr GTT, 3rd trim labs.  Future Appointments  Date Time Provider Department Center  06/14/2020 11:30 AM Theressa Millard, PT [facility]  06/28/2020 11:30 AM Theressa Millard, PT [facility]  07/12/2020 11:30 AM Theressa Millard, PT [facility]  07/19/2020  4:00 PM Theressa Millard, PT [facility]  07/26/2020 10:30 AM Theressa Millard, PT [facility]  08/02/2020 10:30 AM Theressa Millard, PT [facility]    Conan Bowens, MD

## 2020-06-02 LAB — CERVICOVAGINAL ANCILLARY ONLY
Bacterial Vaginitis (gardnerella): NEGATIVE
Candida Glabrata: NEGATIVE
Candida Vaginitis: NEGATIVE
Chlamydia: NEGATIVE
Comment: NEGATIVE
Comment: NEGATIVE
Comment: NEGATIVE
Comment: NEGATIVE
Comment: NEGATIVE
Comment: NORMAL
Neisseria Gonorrhea: NEGATIVE
Trichomonas: NEGATIVE

## 2020-06-03 ENCOUNTER — Other Ambulatory Visit: Payer: Self-pay

## 2020-06-03 ENCOUNTER — Inpatient Hospital Stay (HOSPITAL_COMMUNITY)
Admission: AD | Admit: 2020-06-03 | Discharge: 2020-06-03 | Disposition: A | Discharge status: Home / Self Care | Payer: Medicaid Other | Attending: Obstetrics and Gynecology | Admitting: Obstetrics and Gynecology

## 2020-06-03 ENCOUNTER — Encounter (HOSPITAL_COMMUNITY): Payer: Self-pay | Admitting: Obstetrics and Gynecology

## 2020-06-03 DIAGNOSIS — O26852 Spotting complicating pregnancy, second trimester: Secondary | ICD-10-CM | POA: Diagnosis not present

## 2020-06-03 DIAGNOSIS — O4692 Antepartum hemorrhage, unspecified, second trimester: Secondary | ICD-10-CM | POA: Insufficient documentation

## 2020-06-03 DIAGNOSIS — G43009 Migraine without aura, not intractable, without status migrainosus: Secondary | ICD-10-CM | POA: Diagnosis not present

## 2020-06-03 DIAGNOSIS — Z3689 Encounter for other specified antenatal screening: Secondary | ICD-10-CM

## 2020-06-03 DIAGNOSIS — Z3A25 25 weeks gestation of pregnancy: Secondary | ICD-10-CM | POA: Diagnosis not present

## 2020-06-03 DIAGNOSIS — O09892 Supervision of other high risk pregnancies, second trimester: Secondary | ICD-10-CM

## 2020-06-03 DIAGNOSIS — R109 Unspecified abdominal pain: Secondary | ICD-10-CM

## 2020-06-03 DIAGNOSIS — G43909 Migraine, unspecified, not intractable, without status migrainosus: Secondary | ICD-10-CM | POA: Diagnosis not present

## 2020-06-03 DIAGNOSIS — O26892 Other specified pregnancy related conditions, second trimester: Secondary | ICD-10-CM | POA: Diagnosis not present

## 2020-06-03 DIAGNOSIS — Z3480 Encounter for supervision of other normal pregnancy, unspecified trimester: Secondary | ICD-10-CM

## 2020-06-03 DIAGNOSIS — O99352 Diseases of the nervous system complicating pregnancy, second trimester: Secondary | ICD-10-CM | POA: Diagnosis not present

## 2020-06-03 HISTORY — DX: Calculus of kidney: N20.0

## 2020-06-03 LAB — URINALYSIS, ROUTINE W REFLEX MICROSCOPIC
Bilirubin Urine: NEGATIVE
Glucose, UA: NEGATIVE mg/dL
Hgb urine dipstick: NEGATIVE
Ketones, ur: NEGATIVE mg/dL
Leukocytes,Ua: NEGATIVE
Nitrite: NEGATIVE
Protein, ur: NEGATIVE mg/dL
Specific Gravity, Urine: 1.023 (ref 1.005–1.030)
pH: 6 (ref 5.0–8.0)

## 2020-06-03 MED ORDER — BUTALBITAL-APAP-CAFFEINE 50-325-40 MG PO TABS: 2.0000 | ORAL_TABLET | Freq: Four times a day (QID) | ORAL | 1 refills | Status: DC | PRN

## 2020-06-03 MED ORDER — BUTALBITAL-APAP-CAFFEINE 50-325-40 MG PO TABS
2.0000 | ORAL_TABLET | Freq: Once | ORAL | Status: AC
  Administered 2020-06-03: 2 via ORAL
  Filled 2020-06-03: qty 2

## 2020-06-03 NOTE — MAU Note (Signed)
PT SAYS SHE HAD SPOTTING AT 2330-  AND YESTERDAY X2- ON PAD - PINK . PNC WITH [facility] -  ON Tuesday  -IN OFFICE- TOLD THEM- SAID NL.   CALLED OFFICE  TODAY- NO ANSWER.      FEELS CRAMPS - LOWER ABD -  STARTED MON- TODAY WORSE. Marland Kitchen LAST SEX-  2 WEEKS AGO.

## 2020-06-03 NOTE — MAU Note (Addendum)
TOOK XS TYLENOL 2 TABS AT 1215- NO RELIEF.   HAS H/A - STARTED ON WED- TOOK TYLENOL X3 YESTERDAY .

## 2020-06-03 NOTE — Discharge Instructions (Signed)

## 2020-06-03 NOTE — MAU Note (Addendum)
RN to room - pt noted to be leaning over on bed-states that she is having lower abdominal(previous C/S incision)pain and burning-intensifies with fetal movement. Also reports uterine cramping. Started on Monday with pain increasing in intensity today. Pt denies SROM, Reports vaginal spotting x 2 yesterday and once today. Reports spotting after increased fetal movement. Pt denies spotting or bleeding at this time. Pt reports persistent headache x 3 days that has not responded to tylenol or rest.

## 2020-06-03 NOTE — MAU Provider Note (Signed)
History     CSN: [id number]  Arrival date and time: 06/03/20 1927   Event Date/Time   First Provider Initiated Contact with Patient 06/03/20 2030      Chief Complaint  Patient presents with  . Abdominal Pain  . Vaginal Bleeding  . Headache   Sharon Garner is a 30 y.o. [id number] at [redacted]w[redacted]d who receives care at [facility].  She presents today for Abdominal Pain, Vaginal Bleeding, and Headache.  She states she has been experiencing cramping since Monday that is worsened with fetal movement.  She states that "when he moves really hard I have spotting."  She states she also has "a lot of pain in my c/s and pressure in my private."  Patient states she has taken tylenol without relief of her symptoms. She denies any relieving factors for her pain and rates it a 9/10.  She endorses fetal movement and denies vaginal discharge or leaking.     OB History    Gravida  6   Para  3   Term  3   Preterm      AB  2   Living  3     SAB  2   IAB      Ectopic      Multiple  0   Live Births  3           Past Medical History:  Diagnosis Date  . Kidney stones    kidney stones removed 2021  . Migraine   . Ovarian cyst     Past Surgical History:  Procedure Laterality Date  . CESAREAN SECTION N/A 10/17/2012   Procedure: CESAREAN SECTION;  Surgeon: Tereso Newcomer, MD;  Location: [facility];  Service: Obstetrics;  Laterality: N/A;  . CESAREAN SECTION N/A 05/11/2019   Procedure: CESAREAN SECTION;  Surgeon: Conan Bowens, MD;  Location: [facility];  Service: Obstetrics;  Laterality: N/A;  . KIDNEY STONE SURGERY  2021    Family History  Problem Relation Age of Onset  . Cancer Mother        reproductive  . Other Neg Hx   . Alcohol abuse Neg Hx   . Arthritis Neg Hx   . Asthma Neg Hx   . Birth defects Neg Hx   . COPD Neg Hx   . Depression Neg Hx   . Diabetes Neg Hx   . Drug abuse Neg Hx   . Early death Neg Hx   . Hearing loss Neg Hx   . Heart disease Neg Hx   .  Hyperlipidemia Neg Hx   . Hypertension Neg Hx   . Kidney disease Neg Hx   . Learning disabilities Neg Hx   . Mental illness Neg Hx   . Mental retardation Neg Hx   . Miscarriages / Stillbirths Neg Hx   . Stroke Neg Hx   . Vision loss Neg Hx     Social History   Tobacco Use  . Smoking status: Never Smoker  . Smokeless tobacco: Never Used  Vaping Use  . Vaping Use: Never used  Substance Use Topics  . Alcohol use: No  . Drug use: No    Allergies: No Known Allergies  Medications Prior to Admission  Medication Sig Dispense Refill Last Dose  . acetaminophen (TYLENOL) 325 MG tablet Take 650 mg by mouth every 6 (six) hours as needed for moderate pain.    06/03/2020 at 1215  . Prenatal Vit-Fe Fumarate-FA (PRENATAL VITAMINS) 28-0.8 MG TABS Take 1  tablet by mouth daily. 30 tablet 6 06/03/2020 at Unknown time  . cyclobenzaprine (FLEXERIL) 10 MG tablet Take 0.5-1 tablets (5-10 mg total) by mouth 3 (three) times daily as needed (for headache). (Patient not taking: Reported on 04/06/2020) 20 tablet 1   . cyclobenzaprine (FLEXERIL) 10 MG tablet Take 1 tablet (10 mg total) by mouth every 8 (eight) hours as needed for muscle spasms. (Patient not taking: Reported on 06/01/2020) 30 tablet 1   . glycopyrrolate (ROBINUL) 1 MG tablet Take 1 tablet (1 mg total) by mouth 2 (two) times daily. (Patient not taking: Reported on 04/06/2020) 60 tablet 5   . Prenatal Vit-Fe Fumarate-FA (MULTIVITAMIN-PRENATAL) 27-0.8 MG TABS tablet Take 1 tablet by mouth daily at 12 noon.     . promethazine (PHENERGAN) 25 MG tablet Take 1 tablet (25 mg total) by mouth every 6 (six) hours as needed for nausea or vomiting. (Patient not taking: Reported on 06/01/2020) 30 tablet 0     Review of Systems  Constitutional: Negative for chills and fever.  Respiratory: Negative for cough and shortness of breath.   Gastrointestinal: Positive for abdominal pain. Negative for constipation, diarrhea, nausea and vomiting.  Genitourinary: Positive  for pelvic pain and vaginal bleeding (Spotting 3x in past 2 days). Negative for difficulty urinating, dysuria and vaginal discharge.  Musculoskeletal: Negative for back pain.  Neurological: Positive for headaches (H/O Migraines.  8/10; Left Side). Negative for dizziness and light-headedness.   Physical Exam   Blood pressure 119/64, pulse 91, temperature 98.5 F (36.9 C), temperature source Oral, resp. rate 20, height 5\' 1"  (1.549 m), weight 82 kg, last menstrual period 11/10/2019, not currently breastfeeding.  Physical Exam Vitals reviewed.  Constitutional:      Appearance: Normal appearance. She is well-developed.  HENT:     Head: Normocephalic and atraumatic.  Eyes:     Conjunctiva/sclera: Conjunctivae normal.  Cardiovascular:     Rate and Rhythm: Normal rate.  Pulmonary:     Effort: Pulmonary effort is normal. No respiratory distress.  Abdominal:     Tenderness: There is abdominal tenderness (Mild tenderness at incision).     Comments: Gravid  Musculoskeletal:        General: Normal range of motion.     Cervical back: Normal range of motion.  Skin:    General: Skin is warm and dry.  Neurological:     Mental Status: She is alert and oriented to person, place, and time.  Psychiatric:        Mood and Affect: Mood normal.        Behavior: Behavior normal.        Thought Content: Thought content normal.     Fetal Assessment 140 bpm, Mod Var, -Decels, +Accels Toco: No ctx graphed  MAU Course   Results for orders placed or performed during the hospital encounter of 06/03/20 (from the past 24 hour(s))  Urinalysis, Routine w reflex microscopic Urine, Clean Catch     Status: Abnormal   Collection Time: 06/03/20  7:50 PM  Result Value Ref Range   Color, Urine YELLOW YELLOW   APPearance HAZY (A) CLEAR   Specific Gravity, Urine 1.023 1.005 - 1.030   pH 6.0 5.0 - 8.0   Glucose, UA NEGATIVE NEGATIVE mg/dL   Hgb urine dipstick NEGATIVE NEGATIVE   Bilirubin Urine NEGATIVE  NEGATIVE   Ketones, ur NEGATIVE NEGATIVE mg/dL   Protein, ur NEGATIVE NEGATIVE mg/dL   Nitrite NEGATIVE NEGATIVE   Leukocytes,Ua NEGATIVE NEGATIVE   No results found.  MDM PE Labs: UA EFM Analgesic Assessment and Plan  30 year old [id number]  SIUP at 25.4weeks Cat I FT Abdominal Pain Spotting  Migraine  -POC reviewed.  -Exam performed. -Patient declines pelvic exam stating she had one 2 days ago. -Informed that vaginal spotting is likely from recent pelvic exam and she should continue to monitor. -Reviewed normalcy of abdominal pain/discomfort with short interval pregnancy in setting of previous c/s. -Encouraged usage of pregnancy belt/band to offer increased support. -Patient offered and accepts pain medication. -Will give Fioricet for HA. -NST Reactive; Okay to discontinue EFM  Cherre Robins MSN, CNM 06/03/2020, 8:30 PM   Reassessment (9:27 PM) -Patient reports HA with some improvement, now 6/10. -Will monitor for an additional 30 minutes and reassess.   Reassessment (10:11 PM)  -Patient reports continued improvement in Migraine. -Offered and accepts script for Fioricet.   -Rx sent to pharmacy on file.  -Reiterated usage of pregnancy belt for abdominal support/comfort. -Encouraged to call or return to MAU if symptoms worsen or with the onset of new symptoms. -Discharged to home in improved condition.  Cherre Robins MSN, CNM Advanced Practice Provider, [facility] [employer]

## 2020-06-09 ENCOUNTER — Other Ambulatory Visit: Payer: Self-pay

## 2020-06-09 ENCOUNTER — Other Ambulatory Visit: Payer: Self-pay | Admitting: *Deleted

## 2020-06-09 ENCOUNTER — Ambulatory Visit: Payer: Medicaid Other

## 2020-06-09 ENCOUNTER — Ambulatory Visit: Payer: Medicaid Other | Attending: Obstetrics and Gynecology

## 2020-06-09 ENCOUNTER — Ambulatory Visit: Payer: Medicaid Other | Admitting: *Deleted

## 2020-06-09 ENCOUNTER — Encounter: Payer: Self-pay | Admitting: *Deleted

## 2020-06-09 DIAGNOSIS — O99212 Obesity complicating pregnancy, second trimester: Secondary | ICD-10-CM

## 2020-06-09 DIAGNOSIS — Z3A26 26 weeks gestation of pregnancy: Secondary | ICD-10-CM | POA: Diagnosis not present

## 2020-06-09 DIAGNOSIS — E669 Obesity, unspecified: Secondary | ICD-10-CM | POA: Diagnosis not present

## 2020-06-09 DIAGNOSIS — O358XX0 Maternal care for other (suspected) fetal abnormality and damage, not applicable or unspecified: Secondary | ICD-10-CM | POA: Diagnosis not present

## 2020-06-09 DIAGNOSIS — O09899 Supervision of other high risk pregnancies, unspecified trimester: Secondary | ICD-10-CM

## 2020-06-09 DIAGNOSIS — Z98891 History of uterine scar from previous surgery: Secondary | ICD-10-CM | POA: Diagnosis present

## 2020-06-09 DIAGNOSIS — O09892 Supervision of other high risk pregnancies, second trimester: Secondary | ICD-10-CM

## 2020-06-09 DIAGNOSIS — Z3480 Encounter for supervision of other normal pregnancy, unspecified trimester: Secondary | ICD-10-CM | POA: Diagnosis present

## 2020-06-09 DIAGNOSIS — O34219 Maternal care for unspecified type scar from previous cesarean delivery: Secondary | ICD-10-CM

## 2020-06-14 ENCOUNTER — Encounter: Payer: Medicaid Other | Admitting: Physical Therapy

## 2020-06-14 ENCOUNTER — Telehealth: Payer: Self-pay | Admitting: Physical Therapy

## 2020-06-14 NOTE — Telephone Encounter (Signed)
Called patient about her missed appointment today at 11:30. Left a message.  Eulis Foster, PT @2 /22/2022@ 11:50 AM

## 2020-06-15 ENCOUNTER — Other Ambulatory Visit: Payer: Self-pay

## 2020-06-15 ENCOUNTER — Encounter: Payer: Self-pay | Admitting: Obstetrics and Gynecology

## 2020-06-15 ENCOUNTER — Ambulatory Visit (INDEPENDENT_AMBULATORY_CARE_PROVIDER_SITE_OTHER): Payer: Medicaid Other | Admitting: Obstetrics and Gynecology

## 2020-06-15 ENCOUNTER — Other Ambulatory Visit: Payer: Medicaid Other

## 2020-06-15 VITALS — BP 125/78 | HR 90 | Wt 183.0 lb

## 2020-06-15 DIAGNOSIS — Z3480 Encounter for supervision of other normal pregnancy, unspecified trimester: Secondary | ICD-10-CM | POA: Diagnosis not present

## 2020-06-15 DIAGNOSIS — Z3009 Encounter for other general counseling and advice on contraception: Secondary | ICD-10-CM

## 2020-06-15 DIAGNOSIS — Z3A27 27 weeks gestation of pregnancy: Secondary | ICD-10-CM

## 2020-06-15 DIAGNOSIS — Z98891 History of uterine scar from previous surgery: Secondary | ICD-10-CM

## 2020-06-15 DIAGNOSIS — O99210 Obesity complicating pregnancy, unspecified trimester: Secondary | ICD-10-CM

## 2020-06-15 DIAGNOSIS — O09891 Supervision of other high risk pregnancies, first trimester: Secondary | ICD-10-CM

## 2020-06-15 MED ORDER — MISC. DEVICES MISC: 0 refills | Status: DC

## 2020-06-15 NOTE — Progress Notes (Signed)
ROB [redacted]w[redacted]d   CC: None

## 2020-06-15 NOTE — Progress Notes (Signed)
   PRENATAL VISIT NOTE  Subjective:  Sharon Garner is a 30 y.o. [phone] at [redacted]w[redacted]d being seen today for ongoing prenatal care.  She is currently monitored for the following issues for this low-risk pregnancy and has Family history of intellectual disability; Migraine headache without aura; History of C-section; Supervision of other normal pregnancy, antepartum; Short interval between pregnancies affecting pregnancy in first trimester, antepartum; Round ligament pain; Back pain in pregnancy; Obesity in pregnancy; and Unwanted fertility on their problem list.  Patient reports hip pain.  Contractions: Not present. Vag. Bleeding: None.  Movement: Present. Denies leaking of fluid.   The following portions of the patient's history were reviewed and updated as appropriate: allergies, current medications, past family history, past medical history, past social history, past surgical history and problem list.   Objective:   Vitals:   06/15/20 0918  BP: 125/78  Pulse: 90  Weight: 183 lb (83 kg)    Fetal Status: Fetal Heart Rate (bpm): 135   Movement: Present     General:  Alert, oriented and cooperative. Patient is in no acute distress.  Skin: Skin is warm and dry. No rash noted.   Cardiovascular: Normal heart rate noted  Respiratory: Normal respiratory effort, no problems with respiration noted  Abdomen: Soft, gravid, appropriate for gestational age.  Pain/Pressure: Absent     Pelvic: Cervical exam deferred        Extremities: Normal range of motion.  Edema: Trace  Mental Status: Normal mood and affect. Normal behavior. Normal judgment and thought content.   Assessment and Plan:  Pregnancy: [id number] at [redacted]w[redacted]d 1. Supervision of other normal pregnancy, antepartum - CBC - Glucose Tolerance, 2 Hours w/1 Hour - RPR - HIV Antibody (routine testing w rflx)  2. History of C-section Requested CS date for 09/05/20 today  3. Obesity in pregnancy  4. Short interval between pregnancies  affecting pregnancy in first trimester, antepartum  5. Unwanted fertility For BTL  6. [redacted] weeks gestation of pregnancy   Preterm labor symptoms and general obstetric precautions including but not limited to vaginal bleeding, contractions, leaking of fluid and fetal movement were reviewed in detail with the patient. Please refer to After Visit Summary for other counseling recommendations.   Return in about 2 weeks (around 06/29/2020) for high OB, in person.  Future Appointments  Date Time Provider Department Center  06/15/2020  9:45 AM Conan Bowens, MD [facility] None  06/28/2020 11:30 AM Theressa Millard, PT [facility]  07/12/2020 11:30 AM Theressa Millard, PT [facility]  07/19/2020  4:00 PM Theressa Millard, PT [facility]  07/21/2020  9:30 AM [facility] NURSE [facility]  07/21/2020  9:45 AM [facility] US5 [facility]  07/26/2020 10:30 AM Theressa Millard, PT [facility]  08/02/2020 10:30 AM Theressa Millard, PT [facility]    Conan Bowens, MD

## 2020-06-16 LAB — CBC
Hematocrit: 35 % (ref 34.0–46.6)
Hemoglobin: 11.8 g/dL (ref 11.1–15.9)
MCH: 29 pg (ref 26.6–33.0)
MCHC: 33.7 g/dL (ref 31.5–35.7)
MCV: 86 fL (ref 79–97)
Platelets: 365 10*3/uL (ref 150–450)
RBC: 4.07 x10E6/uL (ref 3.77–5.28)
RDW: 12 % (ref 11.7–15.4)
WBC: 11.5 10*3/uL — ABNORMAL HIGH (ref 3.4–10.8)

## 2020-06-16 LAB — GLUCOSE TOLERANCE, 2 HOURS W/ 1HR
Glucose, 1 hour: 154 mg/dL (ref 65–179)
Glucose, 2 hour: 105 mg/dL (ref 65–152)
Glucose, Fasting: 77 mg/dL (ref 65–91)

## 2020-06-16 LAB — RPR: RPR Ser Ql: NONREACTIVE

## 2020-06-16 LAB — HIV ANTIBODY (ROUTINE TESTING W REFLEX): HIV Screen 4th Generation wRfx: NONREACTIVE

## 2020-06-21 ENCOUNTER — Telehealth: Payer: Self-pay | Admitting: Physical Therapy

## 2020-06-21 ENCOUNTER — Encounter: Payer: Medicaid Other | Attending: Physical Therapy | Admitting: Physical Therapy

## 2020-06-21 DIAGNOSIS — M6281 Muscle weakness (generalized): Secondary | ICD-10-CM | POA: Insufficient documentation

## 2020-06-21 DIAGNOSIS — R252 Cramp and spasm: Secondary | ICD-10-CM | POA: Insufficient documentation

## 2020-06-21 DIAGNOSIS — M545 Low back pain, unspecified: Secondary | ICD-10-CM | POA: Insufficient documentation

## 2020-06-21 NOTE — Telephone Encounter (Signed)
Called patient about her missed appointment today at 8:30. Left a message.  Eulis Foster, PT @3 /04/2020@ 9:04 AM

## 2020-06-28 ENCOUNTER — Encounter: Payer: Medicaid Other | Admitting: Physical Therapy

## 2020-06-30 ENCOUNTER — Other Ambulatory Visit: Payer: Self-pay | Admitting: Obstetrics and Gynecology

## 2020-06-30 ENCOUNTER — Encounter: Payer: Self-pay | Admitting: Obstetrics and Gynecology

## 2020-06-30 ENCOUNTER — Other Ambulatory Visit (HOSPITAL_COMMUNITY)
Admission: RE | Admit: 2020-06-30 | Discharge: 2020-06-30 | Disposition: A | Discharge status: Home / Self Care | Payer: Medicaid Other | Source: Ambulatory Visit | Attending: Obstetrics and Gynecology | Admitting: Obstetrics and Gynecology

## 2020-06-30 ENCOUNTER — Ambulatory Visit (INDEPENDENT_AMBULATORY_CARE_PROVIDER_SITE_OTHER): Payer: Medicaid Other | Admitting: Obstetrics and Gynecology

## 2020-06-30 ENCOUNTER — Other Ambulatory Visit: Payer: Self-pay

## 2020-06-30 VITALS — BP 114/76 | HR 99 | Wt 178.0 lb

## 2020-06-30 DIAGNOSIS — R102 Pelvic and perineal pain: Secondary | ICD-10-CM | POA: Diagnosis not present

## 2020-06-30 DIAGNOSIS — O99210 Obesity complicating pregnancy, unspecified trimester: Secondary | ICD-10-CM

## 2020-06-30 DIAGNOSIS — Z98891 History of uterine scar from previous surgery: Secondary | ICD-10-CM

## 2020-06-30 DIAGNOSIS — Z3480 Encounter for supervision of other normal pregnancy, unspecified trimester: Secondary | ICD-10-CM

## 2020-06-30 DIAGNOSIS — Z3009 Encounter for other general counseling and advice on contraception: Secondary | ICD-10-CM

## 2020-06-30 NOTE — Progress Notes (Signed)
   PRENATAL VISIT NOTE  Subjective:  Sharon Garner is a 30 y.o. [phone] at [redacted]w[redacted]d being seen today for ongoing prenatal care.  She is currently monitored for the following issues for this high-risk pregnancy and has Family history of intellectual disability; Migraine headache without aura; History of C-section; Supervision of other normal pregnancy, antepartum; Short interval between pregnancies affecting pregnancy in first trimester, antepartum; Round ligament pain; Back pain in pregnancy; Obesity in pregnancy; and Unwanted fertility on their problem list.  Patient reports increased contractions over the last 2 days, happened 4-5 times yesterday, 3 in at a time, just feels a lot of pressure..  Contractions: Irregular. Vag. Bleeding: None.  Movement: Present. Denies leaking of fluid.   The following portions of the patient's history were reviewed and updated as appropriate: allergies, current medications, past family history, past medical history, past social history, past surgical history and problem list.   Objective:   Vitals:   06/30/20 1043  BP: 114/76  Pulse: 99  Weight: 178 lb (80.7 kg)    Fetal Status: Fetal Heart Rate (bpm): 145   Movement: Present     General:  Alert, oriented and cooperative. Patient is in no acute distress.  Skin: Skin is warm and dry. No rash noted.   Cardiovascular: Normal heart rate noted  Respiratory: Normal respiratory effort, no problems with respiration noted  Abdomen: Soft, gravid, appropriate for gestational age.  Pain/Pressure: Absent     Pelvic: Cervical exam deferred      SSE: cervix apperas closed, thick white discharge in vagina  Extremities: Normal range of motion.  Edema: None  Mental Status: Normal mood and affect. Normal behavior. Normal judgment and thought content.   Assessment and Plan:  Pregnancy: [id number] at [redacted]w[redacted]d  1. Supervision of other normal pregnancy, antepartum  2. History of C-section Scheduled RCS today  3.  Unwanted fertility  4. Obesity in pregnancy  5. Pelvic pressure in female Cervix appears closed - Urine Culture - Cervicovaginal ancillary only   Preterm labor symptoms and general obstetric precautions including but not limited to vaginal bleeding, contractions, leaking of fluid and fetal movement were reviewed in detail with the patient. Please refer to After Visit Summary for other counseling recommendations.   Return in about 2 weeks (around 07/14/2020) for high OB, in person.  Future Appointments  Date Time Provider Department Center  07/14/2020  4:00 PM Gita Kudo, MD [facility] None  07/21/2020  9:30 AM [facility] NURSE [facility]  07/21/2020  9:45 AM [facility] US5 [facility]    Conan Bowens, MD

## 2020-06-30 NOTE — Progress Notes (Signed)
Pt c/o increased contractions over the last 2 days. No bleeding or increased d/c.

## 2020-07-01 LAB — CERVICOVAGINAL ANCILLARY ONLY
Bacterial Vaginitis (gardnerella): NEGATIVE
Candida Glabrata: NEGATIVE
Candida Vaginitis: NEGATIVE
Chlamydia: NEGATIVE
Comment: NEGATIVE
Comment: NEGATIVE
Comment: NEGATIVE
Comment: NEGATIVE
Comment: NEGATIVE
Comment: NORMAL
Neisseria Gonorrhea: NEGATIVE
Trichomonas: NEGATIVE

## 2020-07-02 LAB — URINE CULTURE

## 2020-07-12 ENCOUNTER — Encounter: Payer: Medicaid Other | Admitting: Physical Therapy

## 2020-07-14 ENCOUNTER — Ambulatory Visit (INDEPENDENT_AMBULATORY_CARE_PROVIDER_SITE_OTHER): Payer: Medicaid Other | Admitting: Obstetrics and Gynecology

## 2020-07-14 ENCOUNTER — Other Ambulatory Visit: Payer: Self-pay

## 2020-07-14 VITALS — BP 110/72 | HR 92 | Wt 182.0 lb

## 2020-07-14 DIAGNOSIS — Z3A31 31 weeks gestation of pregnancy: Secondary | ICD-10-CM

## 2020-07-14 DIAGNOSIS — Z3009 Encounter for other general counseling and advice on contraception: Secondary | ICD-10-CM

## 2020-07-14 DIAGNOSIS — Z3480 Encounter for supervision of other normal pregnancy, unspecified trimester: Secondary | ICD-10-CM

## 2020-07-14 DIAGNOSIS — O99210 Obesity complicating pregnancy, unspecified trimester: Secondary | ICD-10-CM

## 2020-07-14 DIAGNOSIS — Z98891 History of uterine scar from previous surgery: Secondary | ICD-10-CM

## 2020-07-14 NOTE — Patient Instructions (Signed)

## 2020-07-14 NOTE — Progress Notes (Signed)
   PRENATAL VISIT NOTE  Subjective:  Sharon Garner is a 30 y.o. [phone] at [redacted]w[redacted]d being seen today for ongoing prenatal care.  She is currently monitored for the following issues for this low-risk pregnancy and has Family history of intellectual disability; Migraine headache without aura; History of C-section; Supervision of other normal pregnancy, antepartum; Short interval between pregnancies affecting pregnancy in first trimester, antepartum; Round ligament pain; Back pain in pregnancy; Obesity in pregnancy; and Unwanted fertility on their problem list.  Patient reports backache.  Contractions: Not present. Vag. Bleeding: None.  Movement: Present. Denies leaking of fluid.   The following portions of the patient's history were reviewed and updated as appropriate: allergies, current medications, past family history, past medical history, past social history, past surgical history and problem list.   Objective:   Vitals:   07/14/20 1533  BP: 110/72  Pulse: 92  Weight: 182 lb (82.6 kg)    Fetal Status: Fetal Heart Rate (bpm): 135   Movement: Present     General:  Alert, oriented and cooperative. Patient is in no acute distress.  Skin: Skin is warm and dry. No rash noted.   Cardiovascular: Normal heart rate noted  Respiratory: Normal respiratory effort, no problems with respiration noted  Abdomen: Soft, gravid, appropriate for gestational age.  Pain/Pressure: Present     Pelvic: Cervical exam deferred        Extremities: Normal range of motion.  Edema: Trace  Mental Status: Normal mood and affect. Normal behavior. Normal judgment and thought content.   Assessment and Plan:  Pregnancy: [id number] at [redacted]w[redacted]d 1. Supervision of other normal pregnancy, antepartum -discussed round ligament pain, stretching, exercise, etc. Encouraged rest when needed. Answered all patient questions.   2. History of C-section Repeat LTCS and BTL scheduled for 5/16   3. Unwanted fertility BTL papers  resigned today  4. Obesity in pregnancy TWG 4lb   5. [redacted] weeks gestation of pregnancy   Preterm labor symptoms and general obstetric precautions including but not limited to vaginal bleeding, contractions, leaking of fluid and fetal movement were reviewed in detail with the patient. Please refer to After Visit Summary for other counseling recommendations.   No follow-ups on file.  Future Appointments  Date Time Provider Department Center  07/14/2020  4:00 PM Gita Kudo, MD [facility] None  07/21/2020  9:30 AM [facility] NURSE [facility]  07/21/2020  9:45 AM [facility] US5 [facility]    Gita Kudo, MD

## 2020-07-14 NOTE — Progress Notes (Signed)
ROB c/o pressure and pain sometimes.

## 2020-07-19 ENCOUNTER — Encounter: Payer: Medicaid Other | Admitting: Physical Therapy

## 2020-07-21 ENCOUNTER — Encounter: Payer: Self-pay | Admitting: *Deleted

## 2020-07-21 ENCOUNTER — Ambulatory Visit: Payer: Medicaid Other | Attending: Obstetrics and Gynecology

## 2020-07-21 ENCOUNTER — Other Ambulatory Visit: Payer: Self-pay

## 2020-07-21 ENCOUNTER — Ambulatory Visit: Payer: Medicaid Other | Admitting: *Deleted

## 2020-07-21 DIAGNOSIS — Z3480 Encounter for supervision of other normal pregnancy, unspecified trimester: Secondary | ICD-10-CM | POA: Insufficient documentation

## 2020-07-21 DIAGNOSIS — O09899 Supervision of other high risk pregnancies, unspecified trimester: Secondary | ICD-10-CM | POA: Diagnosis not present

## 2020-07-22 ENCOUNTER — Encounter (HOSPITAL_COMMUNITY): Payer: Self-pay | Admitting: Obstetrics & Gynecology

## 2020-07-22 ENCOUNTER — Inpatient Hospital Stay (HOSPITAL_COMMUNITY)
Admission: AD | Admit: 2020-07-22 | Discharge: 2020-07-22 | Disposition: A | Discharge status: Home / Self Care | Payer: Medicaid Other | Attending: Obstetrics & Gynecology | Admitting: Obstetrics & Gynecology

## 2020-07-22 ENCOUNTER — Other Ambulatory Visit: Payer: Self-pay

## 2020-07-22 DIAGNOSIS — R42 Dizziness and giddiness: Secondary | ICD-10-CM | POA: Diagnosis not present

## 2020-07-22 DIAGNOSIS — O26893 Other specified pregnancy related conditions, third trimester: Secondary | ICD-10-CM | POA: Insufficient documentation

## 2020-07-22 DIAGNOSIS — R000 Tachycardia, unspecified: Secondary | ICD-10-CM | POA: Insufficient documentation

## 2020-07-22 DIAGNOSIS — D649 Anemia, unspecified: Secondary | ICD-10-CM

## 2020-07-22 DIAGNOSIS — R531 Weakness: Secondary | ICD-10-CM | POA: Diagnosis not present

## 2020-07-22 DIAGNOSIS — O99353 Diseases of the nervous system complicating pregnancy, third trimester: Secondary | ICD-10-CM | POA: Diagnosis not present

## 2020-07-22 DIAGNOSIS — Z3480 Encounter for supervision of other normal pregnancy, unspecified trimester: Secondary | ICD-10-CM

## 2020-07-22 DIAGNOSIS — O99013 Anemia complicating pregnancy, third trimester: Secondary | ICD-10-CM | POA: Diagnosis not present

## 2020-07-22 DIAGNOSIS — Z3689 Encounter for other specified antenatal screening: Secondary | ICD-10-CM | POA: Insufficient documentation

## 2020-07-22 DIAGNOSIS — Z3A32 32 weeks gestation of pregnancy: Secondary | ICD-10-CM

## 2020-07-22 DIAGNOSIS — G44209 Tension-type headache, unspecified, not intractable: Secondary | ICD-10-CM | POA: Insufficient documentation

## 2020-07-22 LAB — BASIC METABOLIC PANEL
Anion gap: 7 (ref 5–15)
BUN: <5 mg/dL — ABNORMAL LOW (ref 6–20)
CO2: 20 mmol/L — ABNORMAL LOW (ref 22–32)
Calcium: 8.4 mg/dL — ABNORMAL LOW (ref 8.9–10.3)
Chloride: 107 mmol/L (ref 98–111)
Creatinine, Ser: 0.58 mg/dL (ref 0.44–1.00)
GFR, Estimated: >60 mL/min (ref >60)
Glucose, Bld: 88 mg/dL (ref 70–99)
Potassium: 3.4 mmol/L — ABNORMAL LOW (ref 3.5–5.1)
Sodium: 134 mmol/L — ABNORMAL LOW (ref 135–145)

## 2020-07-22 LAB — CBC
HCT: 33.5 % — ABNORMAL LOW (ref 36.0–46.0)
Hemoglobin: 10.9 g/dL — ABNORMAL LOW (ref 12.0–15.0)
MCH: 27.2 pg (ref 26.0–34.0)
MCHC: 32.5 g/dL (ref 30.0–36.0)
MCV: 83.5 fL (ref 80.0–100.0)
Platelets: 301 10*3/uL (ref 150–400)
RBC: 4.01 MIL/uL (ref 3.87–5.11)
RDW: 12.9 % (ref 11.5–15.5)
WBC: 9.9 10*3/uL (ref 4.0–10.5)
nRBC: 0 % (ref 0.0–0.2)

## 2020-07-22 LAB — URINALYSIS, ROUTINE W REFLEX MICROSCOPIC
Bilirubin Urine: NEGATIVE
Glucose, UA: 150 mg/dL — AB
Hgb urine dipstick: NEGATIVE
Ketones, ur: 5 mg/dL — AB
Nitrite: NEGATIVE
Protein, ur: NEGATIVE mg/dL
Specific Gravity, Urine: 1.015 (ref 1.005–1.030)
pH: 6 (ref 5.0–8.0)

## 2020-07-22 MED ORDER — BUTALBITAL-APAP-CAFFEINE 50-325-40 MG PO TABS
1.0000 | ORAL_TABLET | Freq: Once | ORAL | Status: AC
  Administered 2020-07-22: 1 via ORAL
  Filled 2020-07-22: qty 1

## 2020-07-22 NOTE — MAU Note (Signed)
Sharon Garner is a 30 y.o. at [redacted]w[redacted]d here in MAU reporting: for the past 2-3 days has been feeling weak and shaky. States her heart rate feels high and she starts to feel lightheaded. Also has a headache. No bleeding or LOF. +FM  Onset of complaint: ongoing  Pain score: 5/10  Vitals:   07/22/20 1646  BP: 110/68  Pulse: 99  Resp: 20  Temp: 98 F (36.7 C)  SpO2: 98%     FHT:141  Lab orders placed from triage: UA

## 2020-07-22 NOTE — Discharge Instructions (Signed)
Pregnancy and Anemia  Anemia is a condition in which there is not enough red blood cells or hemoglobin in the blood. Hemoglobin is a substance in red blood cells that carries oxygen. When you do not have enough red blood cells or hemoglobin (are anemic), your body cannot get enough oxygen and your organs may not work properly. Anemia is common during pregnancy because your body needs more blood volume and blood cells to provide nutrition to the unborn baby. What are the causes? The most common cause of anemia during pregnancy is not having enough iron in the body to make red blood cells (iron deficiency anemia). Other causes may include:  Folic acid deficiency.  Vitamin B12 deficiency.  Certain prescription or over-the-counter medicines.  Certain medical conditions or infections that destroy red blood cells.  A low platelet count and bleeding caused by antibodies that go through the placenta to the baby from the mother's blood. What are the signs or symptoms? Mild anemia may not cause any symptoms. If anemia becomes severe, symptoms may include:  Feeling tired or weak.  Shortness of breath, especially during activity.  Fainting.  Pale skin.  Headaches.  A fast or irregular heartbeat.  Dizziness. How is this diagnosed? This condition may be diagnosed based on your medical history and a physical exam. You may also have blood tests. How is this treated? Treatment for anemia during pregnancy depends on the cause of the anemia. Treatment may include:  Making changes to your diet.  Taking iron, vitamin B12, or folic acid supplements.  Having a blood transfusion. This may be needed if the anemia is severe. Follow these instructions at home: Eating and drinking  Follow recommendations from your health care provider about changing your diet.  Eat a diet rich in iron. This would include foods such as: ? Liver. ? Beef. ? Eggs. ? Whole grains. ? Spinach. ? Dried  fruit.  Increase your vitamin C intake. This will help the stomach absorb more iron. Some foods that are high in vitamin C include: ? Oranges. ? Peppers. ? Tomatoes. ? Mangoes.  Eat green leafy vegetables. These are a good source of folic acid. General instructions  Take iron supplements and vitamins as told by your health care provider.  Keep all follow-up visits. This is important. Contact a health care provider if:  You have headaches that happen often or do not go away.  You bruise easily.  You have a fever.  You have nausea and vomiting for more than 24 hours.  You are unable to take supplements prescribed to treat your anemia. Get help right away if:  You develop signs or symptoms of severe anemia.  You have bleeding from your vagina.  You develop a rash.  You have bloody or tarry stools.  You are very dizzy or you faint. Summary  Anemia is a condition in which there is not enough red blood cells or hemoglobin in the blood.  The most common cause of anemia during pregnancy is not having enough iron in the body to make red blood cells (iron deficiency anemia).  Mild anemia may not cause any symptoms. If it becomes severe, symptoms may include feeling tired and weak.  Take iron supplements and vitamins as told by your health care provider.  Keep all follow-up visits. This is important. This information is not intended to replace advice given to you by your health care provider. Make sure you discuss any questions you have with your health care provider. Document   Revised: 08/11/2019 Document Reviewed: 08/11/2019 Elsevier Patient Education  2021 [employer] Inc.   Tension Headache, Adult A tension headache is a feeling of pain, pressure, or aching in the head. It is often felt over the front and sides of the head. Tension headaches can last from 30 minutes to several days. What are the causes? The cause of this condition is not known. Sometimes, tension  headaches are brought on by stress, worry (anxiety), or depression. Other things that may set them off include:  Alcohol.  Too much caffeine or caffeine withdrawal.  Colds, flu, or sinus infections.  Dental problems. This can include clenching your teeth.  Being tired.  Holding your head and neck in the same position for a long time, such as while using a computer.  Smoking.  Arthritis in the neck. What are the signs or symptoms?  Feeling pressure around the head.  A dull ache in the head.  Pain over the front and sides of the head.  Feeling sore or tender in the muscles of the head, neck, and shoulders. How is this treated? This condition may be treated with lifestyle changes and with medicines that help relieve symptoms. Follow these instructions at home: Managing pain  Take over-the-counter and prescription medicines only as told by your doctor.  When you have a headache, lie down in a dark, quiet room.  If told, put ice on your head and neck. To do this: ? Put ice in a plastic bag. ? Place a towel between your skin and the bag. ? Leave the ice on for 20 minutes, 2-3 times a day. ? Take off the ice if your skin turns bright red. This is very important. If you cannot feel pain, heat, or cold, you have a greater risk of damage to the area.  If told, put heat on the back of your neck. Do this as often as told by your doctor. Use the heat source that your doctor recommends, such as a moist heat pack or a heating pad. ? Place a towel between your skin and the heat source. ? Leave the heat on for 20-30 minutes. ? Take off the heat if your skin turns bright red. This is very important. If you cannot feel pain, heat, or cold, you have a greater risk of getting burned. Eating and drinking  Eat meals on a regular schedule.  If you drink alcohol: ? Limit how much you have to:  0-1 drink a day for women who are not pregnant.  0-2 drinks a day for men. ? Know how much  alcohol is in your drink. In the [place] one drink equals one 12 oz bottle of beer (355 mL), one 5 oz glass of wine (148 mL), or one 1 oz glass of hard liquor (44 mL).  Drink enough fluid to keep your pee (urine) pale yellow.  Do not use a lot of caffeine, or stop using caffeine. Lifestyle  Get 7-9 hours of sleep each night. Or get the amount of sleep that your doctor tells you to.  At bedtime, keep computers, phones, and tablets out of your room.  Find ways to lessen your stress. This may include: ? Exercise. ? Deep breathing. ? Yoga. ? Listening to music. ? Thinking positive thoughts.  Sit up straight. Try to relax your muscles.  Do not smoke or use any products that contain nicotine or tobacco. If you need help quitting, ask your doctor. General instructions  Avoid things that can bring on headaches.  Keep a headache journal to see what may bring on headaches. For example, write down: ? What you eat and drink. ? How much sleep you get. ? Any change to your diet or medicines.  Keep all follow-up visits.   Contact a doctor if:  Your headache does not get better.  Your headache comes back.  You have a headache, and sounds, light, or smells bother you.  You feel like you may vomit, or you vomit.  Your stomach hurts. Get help right away if:  You all of a sudden get a very bad headache with any of these things: ? A stiff neck. ? Feeling like you may vomit. ? Vomiting. ? Feeling mixed up (confused). ? Feeling weak in one part or one side of your body. ? Having trouble seeing or speaking, or both. ? Feeling short of breath. ? A rash. ? Feeling very sleepy. ? Pain in your eye or ear. ? Trouble walking or balancing. ? Feeling like you will faint, or you faint. Summary  A tension headache is pain, pressure, or aching in your head.  Tension headaches can last from 30 minutes to several days.  Lifestyle changes and medicines may help relieve pain. This information is  not intended to replace advice given to you by your health care provider. Make sure you discuss any questions you have with your health care provider. Document Revised: 01/07/2020 Document Reviewed: 01/07/2020 Elsevier Patient Education  2021 [employer].

## 2020-07-22 NOTE — MAU Provider Note (Signed)
History     CSN: [id number]  Arrival date and time: 07/22/20 1622   Event Date/Time   First Provider Initiated Contact with Patient 07/22/20 1730      Chief Complaint  Patient presents with  . Weakness  . Headache   30 y.o. [id number] @32 .4 wks presenting with HA, weakness, heart racing, and dizziness. Sx started 2-3 days ago. Denies syncope. Denies recent illness or fever. Reports drinking 4 bottles of water, however she only ate a gravy biscuit and some vegetable/noodle soup today. Reports good FM. No pregnancy complaints.   OB History    Gravida  6   Para  3   Term  3   Preterm      AB  2   Living  3     SAB  2   IAB      Ectopic      Multiple  0   Live Births  3           Past Medical History:  Diagnosis Date  . Kidney stones    kidney stones removed 2021  . Migraine   . Ovarian cyst     Past Surgical History:  Procedure Laterality Date  . CESAREAN SECTION N/A 10/17/2012   Procedure: CESAREAN SECTION;  Surgeon: 10/19/2012, MD;  Location: [facility];  Service: Obstetrics;  Laterality: N/A;  . CESAREAN SECTION N/A 05/11/2019   Procedure: CESAREAN SECTION;  Surgeon: 05/13/2019, MD;  Location: [facility];  Service: Obstetrics;  Laterality: N/A;  . KIDNEY STONE SURGERY  2021    Family History  Problem Relation Age of Onset  . Cancer Mother        reproductive  . Other Neg Hx   . Alcohol abuse Neg Hx   . Arthritis Neg Hx   . Asthma Neg Hx   . Birth defects Neg Hx   . COPD Neg Hx   . Depression Neg Hx   . Diabetes Neg Hx   . Drug abuse Neg Hx   . Early death Neg Hx   . Hearing loss Neg Hx   . Heart disease Neg Hx   . Hyperlipidemia Neg Hx   . Hypertension Neg Hx   . Kidney disease Neg Hx   . Learning disabilities Neg Hx   . Mental illness Neg Hx   . Mental retardation Neg Hx   . Miscarriages / Stillbirths Neg Hx   . Stroke Neg Hx   . Vision loss Neg Hx     Social History   Tobacco Use  . Smoking status: Never Smoker  .  Smokeless tobacco: Never Used  Vaping Use  . Vaping Use: Never used  Substance Use Topics  . Alcohol use: No  . Drug use: No    Allergies: No Known Allergies  Medications Prior to Admission  Medication Sig Dispense Refill Last Dose  . Prenatal Vit-Fe Fumarate-FA (PRENATAL VITAMINS) 28-0.8 MG TABS Take 1 tablet by mouth daily. 30 tablet 6 07/22/2020 at Unknown time  . acetaminophen (TYLENOL) 325 MG tablet Take 650 mg by mouth every 6 (six) hours as needed for moderate pain.      . butalbital-acetaminophen-caffeine (FIORICET) 50-325-40 MG tablet Take 2 tablets by mouth every 6 (six) hours as needed for migraine. (Patient not taking: Reported on 07/21/2020) 30 tablet 1   . Misc. Devices MISC Dispense one maternity belt for patient 1 each 0     Review of Systems  Constitutional: Negative for fever.  Eyes: Negative for visual disturbance.  Gastrointestinal: Negative for abdominal pain.  Genitourinary: Negative for vaginal bleeding.  Neurological: Positive for dizziness and headaches.   Physical Exam   Blood pressure 110/68, pulse 99, temperature 98 F (36.7 C), temperature source Oral, resp. rate 20, height 5\' 1"  (1.549 m), weight 82.5 kg, last menstrual period 11/10/2019, SpO2 98 %, not currently breastfeeding.  Physical Exam Vitals and nursing note reviewed.  Constitutional:      General: She is not in acute distress.    Appearance: Normal appearance.  HENT:     Head: Normocephalic and atraumatic.  Cardiovascular:     Rate and Rhythm: Normal rate and regular rhythm.     Heart sounds: Normal heart sounds.  Pulmonary:     Effort: Pulmonary effort is normal. No respiratory distress.     Breath sounds: Normal breath sounds. No wheezing, rhonchi or rales.  Musculoskeletal:        General: Normal range of motion.     Cervical back: Normal range of motion.  Skin:    General: Skin is warm and dry.  Neurological:     General: No focal deficit present.     Mental Status: She is  alert and oriented to person, place, and time.  Psychiatric:        Mood and Affect: Mood normal.        Behavior: Behavior normal.   EFM: 125 bpm, mod variability, + accels, no decels Toco: none  Results for orders placed or performed during the hospital encounter of 07/22/20 (from the past 24 hour(s))  Urinalysis, Routine w reflex microscopic Urine, Clean Catch     Status: Abnormal   Collection Time: 07/22/20  4:42 PM  Result Value Ref Range   Color, Urine YELLOW YELLOW   APPearance HAZY (A) CLEAR   Specific Gravity, Urine 1.015 1.005 - 1.030   pH 6.0 5.0 - 8.0   Glucose, UA 150 (A) NEGATIVE mg/dL   Hgb urine dipstick NEGATIVE NEGATIVE   Bilirubin Urine NEGATIVE NEGATIVE   Ketones, ur 5 (A) NEGATIVE mg/dL   Protein, ur NEGATIVE NEGATIVE mg/dL   Nitrite NEGATIVE NEGATIVE   Leukocytes,Ua MODERATE (A) NEGATIVE   RBC / HPF 0-5 0 - 5 RBC/hpf   WBC, UA 0-5 0 - 5 WBC/hpf   Bacteria, UA MANY (A) NONE SEEN   Squamous Epithelial / LPF 11-20 0 - 5   Mucus PRESENT   CBC     Status: Abnormal   Collection Time: 07/22/20  5:39 PM  Result Value Ref Range   WBC 9.9 4.0 - 10.5 K/uL   RBC 4.01 3.87 - 5.11 MIL/uL   Hemoglobin 10.9 (L) 12.0 - 15.0 g/dL   HCT 09/21/20 (L) 89.2 - 11.9 %   MCV 83.5 80.0 - 100.0 fL   MCH 27.2 26.0 - 34.0 pg   MCHC 32.5 30.0 - 36.0 g/dL   RDW 41.7 40.8 - 14.4 %   Platelets 301 150 - 400 K/uL   nRBC 0.0 0.0 - 0.2 %  Basic metabolic panel     Status: Abnormal   Collection Time: 07/22/20  5:39 PM  Result Value Ref Range   Sodium 134 (L) 135 - 145 mmol/L   Potassium 3.4 (L) 3.5 - 5.1 mmol/L   Chloride 107 98 - 111 mmol/L   CO2 20 (L) 22 - 32 mmol/L   Glucose, Bld 88 70 - 99 mg/dL   BUN <5 (L) 6 - 20 mg/dL   Creatinine, Ser 09/21/20  0.44 - 1.00 mg/dL   Calcium 8.4 (L) 8.9 - 10.3 mg/dL   GFR, Estimated >59 >16 mL/min   Anion gap 7 5 - 15   MAU Course  Procedures  MDM Labs ordered and reviewed. Not orthostatic. HA resolved. Anemia noted. Sx likely caused by  dietary choices and lack of calories and protein, recommendations provided. UA with many bacteria, pt asymptomatic, UC ordered. Stable for discharge home.   Assessment and Plan   1. [redacted] weeks gestation of pregnancy   2. Supervision of other normal pregnancy, antepartum   3. NST (non-stress test) reactive   4. Acute non intractable tension-type headache   5. Anemia during pregnancy in third trimester    Discharge home Follow up at [facility] as scheduled Return precautions  Allergies as of 07/22/2020   No Known Allergies     Medication List    TAKE these medications   acetaminophen 325 MG tablet Commonly known as: TYLENOL Take 650 mg by mouth every 6 (six) hours as needed for moderate pain.   butalbital-acetaminophen-caffeine 50-325-40 MG tablet Commonly known as: FIORICET Take 2 tablets by mouth every 6 (six) hours as needed for migraine.   Misc. Devices Misc Dispense one maternity belt for patient   Prenatal Vitamins 28-0.8 MG Tabs Take 1 tablet by mouth daily.      Donette Larry, CNM 07/22/2020, 5:56 PM

## 2020-07-25 LAB — CULTURE, OB URINE: Culture: 40000 — AB

## 2020-07-26 ENCOUNTER — Encounter: Payer: Medicaid Other | Admitting: Physical Therapy

## 2020-07-26 ENCOUNTER — Telehealth: Payer: Self-pay | Admitting: Certified Nurse Midwife

## 2020-07-26 DIAGNOSIS — O2340 Unspecified infection of urinary tract in pregnancy, unspecified trimester: Secondary | ICD-10-CM | POA: Insufficient documentation

## 2020-07-26 DIAGNOSIS — O2343 Unspecified infection of urinary tract in pregnancy, third trimester: Secondary | ICD-10-CM

## 2020-07-26 MED ORDER — NITROFURANTOIN MONOHYD MACRO 100 MG PO CAPS: 100.0000 mg | ORAL_CAPSULE | Freq: Two times a day (BID) | ORAL | 0 refills | Status: DC

## 2020-07-26 NOTE — Telephone Encounter (Signed)
Pt notified of +UTI and RX sent.

## 2020-07-28 ENCOUNTER — Ambulatory Visit (INDEPENDENT_AMBULATORY_CARE_PROVIDER_SITE_OTHER): Payer: Medicaid Other | Admitting: Obstetrics and Gynecology

## 2020-07-28 ENCOUNTER — Encounter: Payer: Self-pay | Admitting: Obstetrics and Gynecology

## 2020-07-28 ENCOUNTER — Other Ambulatory Visit: Payer: Self-pay

## 2020-07-28 VITALS — BP 123/74 | HR 89 | Wt 183.0 lb

## 2020-07-28 DIAGNOSIS — Z3009 Encounter for other general counseling and advice on contraception: Secondary | ICD-10-CM

## 2020-07-28 DIAGNOSIS — Z3480 Encounter for supervision of other normal pregnancy, unspecified trimester: Secondary | ICD-10-CM

## 2020-07-28 DIAGNOSIS — Z98891 History of uterine scar from previous surgery: Secondary | ICD-10-CM

## 2020-07-28 NOTE — Progress Notes (Signed)
ROB c/o pain/pressure and cramping 6/10 x 3 days

## 2020-07-28 NOTE — Progress Notes (Signed)
   PRENATAL VISIT NOTE  Subjective:  Sharon Garner is a 30 y.o. [phone] at [redacted]w[redacted]d being seen today for ongoing prenatal care.  She is currently monitored for the following issues for this low-risk pregnancy and has Family history of intellectual disability; Migraine headache without aura; History of C-section; Supervision of other normal pregnancy, antepartum; Short interval between pregnancies affecting pregnancy in first trimester, antepartum; Round ligament pain; Back pain in pregnancy; Obesity in pregnancy; Unwanted fertility; and UTI (urinary tract infection) during pregnancy on their problem list.  Patient reports backache.  Contractions: Irritability. Vag. Bleeding: None.  Movement: Present. Denies leaking of fluid.   The following portions of the patient's history were reviewed and updated as appropriate: allergies, current medications, past family history, past medical history, past social history, past surgical history and problem list.   Objective:   Vitals:   07/28/20 1036  BP: 123/74  Pulse: 89  Weight: 183 lb (83 kg)    Fetal Status: Fetal Heart Rate (bpm): 153 Fundal Height: 34 cm Movement: Present     General:  Alert, oriented and cooperative. Patient is in no acute distress.  Skin: Skin is warm and dry. No rash noted.   Cardiovascular: Normal heart rate noted  Respiratory: Normal respiratory effort, no problems with respiration noted  Abdomen: Soft, gravid, appropriate for gestational age.  Pain/Pressure: Present     Pelvic: Cervical exam deferred        Extremities: Normal range of motion.  Edema: Trace  Mental Status: Normal mood and affect. Normal behavior. Normal judgment and thought content.   Assessment and Plan:  Pregnancy: [id number] at [redacted]w[redacted]d 1. Supervision of other normal pregnancy, antepartum Patient is doing well with the exception of back pain Patient has not used previously prescribed maternity support belt Encouraged patient to perform back  exercises as well  2. History of C-section Scheduled for repeat with BTL  3. Unwanted fertility   Preterm labor symptoms and general obstetric precautions including but not limited to vaginal bleeding, contractions, leaking of fluid and fetal movement were reviewed in detail with the patient. Please refer to After Visit Summary for other counseling recommendations.   Return in about 2 weeks (around 08/11/2020) for in person, ROB, Low risk.  Future Appointments  Date Time Provider Department Center  08/11/2020 10:45 AM Warden Fillers, MD [facility] None    Catalina Antigua, MD

## 2020-08-02 ENCOUNTER — Encounter: Payer: Medicaid Other | Admitting: Physical Therapy

## 2020-08-11 ENCOUNTER — Inpatient Hospital Stay (HOSPITAL_COMMUNITY)
Admission: AD | Admit: 2020-08-11 | Discharge: 2020-08-11 | Disposition: A | Discharge status: Home / Self Care | Payer: Medicaid Other | Attending: Family Medicine | Admitting: Family Medicine

## 2020-08-11 ENCOUNTER — Encounter (HOSPITAL_COMMUNITY): Payer: Self-pay | Admitting: Family Medicine

## 2020-08-11 ENCOUNTER — Ambulatory Visit (INDEPENDENT_AMBULATORY_CARE_PROVIDER_SITE_OTHER): Payer: Medicaid Other | Admitting: Obstetrics and Gynecology

## 2020-08-11 ENCOUNTER — Other Ambulatory Visit: Payer: Self-pay

## 2020-08-11 VITALS — BP 117/79 | HR 78 | Wt 182.0 lb

## 2020-08-11 DIAGNOSIS — Z3A35 35 weeks gestation of pregnancy: Secondary | ICD-10-CM | POA: Insufficient documentation

## 2020-08-11 DIAGNOSIS — R109 Unspecified abdominal pain: Secondary | ICD-10-CM

## 2020-08-11 DIAGNOSIS — R102 Pelvic and perineal pain: Secondary | ICD-10-CM | POA: Diagnosis not present

## 2020-08-11 DIAGNOSIS — Z3689 Encounter for other specified antenatal screening: Secondary | ICD-10-CM

## 2020-08-11 DIAGNOSIS — Z3480 Encounter for supervision of other normal pregnancy, unspecified trimester: Secondary | ICD-10-CM

## 2020-08-11 DIAGNOSIS — Z98891 History of uterine scar from previous surgery: Secondary | ICD-10-CM

## 2020-08-11 DIAGNOSIS — O26893 Other specified pregnancy related conditions, third trimester: Secondary | ICD-10-CM | POA: Diagnosis not present

## 2020-08-11 DIAGNOSIS — O479 False labor, unspecified: Secondary | ICD-10-CM

## 2020-08-11 DIAGNOSIS — O99891 Other specified diseases and conditions complicating pregnancy: Secondary | ICD-10-CM

## 2020-08-11 DIAGNOSIS — O99210 Obesity complicating pregnancy, unspecified trimester: Secondary | ICD-10-CM

## 2020-08-11 LAB — URINALYSIS, ROUTINE W REFLEX MICROSCOPIC
Bilirubin Urine: NEGATIVE
Glucose, UA: NEGATIVE mg/dL
Hgb urine dipstick: NEGATIVE
Ketones, ur: NEGATIVE mg/dL
Nitrite: NEGATIVE
Protein, ur: NEGATIVE mg/dL
Specific Gravity, Urine: 1.016 (ref 1.005–1.030)
pH: 6 (ref 5.0–8.0)

## 2020-08-11 LAB — WET PREP, GENITAL
Clue Cells Wet Prep HPF POC: NONE SEEN
Sperm: NONE SEEN
Trich, Wet Prep: NONE SEEN
Yeast Wet Prep HPF POC: NONE SEEN

## 2020-08-11 MED ORDER — CYCLOBENZAPRINE HCL 5 MG PO TABS
10.0000 mg | ORAL_TABLET | Freq: Once | ORAL | Status: AC
  Administered 2020-08-11: 10 mg via ORAL
  Filled 2020-08-11 (×2): qty 2

## 2020-08-11 NOTE — Discharge Instructions (Signed)
Abdominal Pain During Pregnancy Abdominal pain is common during pregnancy and has many possible causes. Some causes are more serious than others, and sometimes the cause is not known. Abdominal pain can be a sign that labor is starting. It can also be caused by normal growth of your baby causing stretching of muscles and ligaments during pregnancy. Always tell your health care provider if you have any abdominal pain. Follow these instructions at home:  Do not have sex or put anything in your vagina until your pain goes away completely.  Get plenty of rest until your pain improves.  Drink enough fluid to keep your urine pale yellow.  Take over-the-counter and prescription medicines only as told by your health care provider.  Keep all follow-up visits. This is important.   Contact a health care provider if:  Your pain continues or gets worse after resting.  You have lower abdominal pain that: ? Comes and goes at regular intervals. ? Spreads to your back. ? Is similar to menstrual cramps.  You have pain or burning when you urinate. Get help right away if:  You have a fever, chills, or shortness of breath.  You have vaginal bleeding.  You are leaking fluid or passing tissue from your vagina.  You have vomiting or diarrhea that lasts for more than 24 hours.  Your baby is moving less than usual.  You feel very weak or faint.  You develop severe pain in your upper abdomen. Summary  Abdominal pain is common during pregnancy and has many possible causes.  If you experience abdominal pain during pregnancy, tell your health care provider right away.  Follow your health care provider's home care instructions and keep all follow-up visits as told. This information is not intended to replace advice given to you by your health care provider. Make sure you discuss any questions you have with your health care provider. Document Revised: 12/22/2019 Document Reviewed: 12/22/2019 Elsevier  Patient Education  2021 [employer]  

## 2020-08-11 NOTE — MAU Provider Note (Signed)
History     CSN: [id number]  Arrival date and time: 08/11/20 1758   Event Date/Time   First Provider Initiated Contact with Patient 08/11/20 1858      Chief Complaint  Patient presents with  . Abdominal Pain  . Vaginal Discharge   Sharon Garner is a 30 y.o. [phone] at [redacted]w[redacted]d who receives care at [facility].  She presents today for Abdominal Pain and Vaginal Discharge.  She states she has been having cramping for the past 2 days that is intermittent.  She reports it starts in her abdomen and "wraps around to my hips."  She also reports pressure at the top of her abdomen that radiates to her pelvis.  She reports the cramping is worsened with laying on her side, but has no known relieving factors. She rates the cramping a 7-8/10.  She states she has taken tylenol with no relief.  She also reports she has yellowish white discharge and was told it was normal today while at the office.  However, she would like testing to rule out any infections. She endorses fetal movement and denies vaginal bleeding and contractions.    OB History    Gravida  6   Para  3   Term  3   Preterm      AB  2   Living  3     SAB  2   IAB      Ectopic      Multiple  0   Live Births  3           Past Medical History:  Diagnosis Date  . Kidney stones    kidney stones removed 2021  . Migraine   . Ovarian cyst     Past Surgical History:  Procedure Laterality Date  . CESAREAN SECTION N/A 10/17/2012   Procedure: CESAREAN SECTION;  Surgeon: Tereso Newcomer, MD;  Location: [facility];  Service: Obstetrics;  Laterality: N/A;  . CESAREAN SECTION N/A 05/11/2019   Procedure: CESAREAN SECTION;  Surgeon: Conan Bowens, MD;  Location: [facility];  Service: Obstetrics;  Laterality: N/A;  . KIDNEY STONE SURGERY  2021    Family History  Problem Relation Age of Onset  . Cancer Mother        reproductive  . Other Neg Hx   . Alcohol abuse Neg Hx   . Arthritis Neg Hx   . Asthma Neg Hx   . Birth  defects Neg Hx   . COPD Neg Hx   . Depression Neg Hx   . Diabetes Neg Hx   . Drug abuse Neg Hx   . Early death Neg Hx   . Hearing loss Neg Hx   . Heart disease Neg Hx   . Hyperlipidemia Neg Hx   . Hypertension Neg Hx   . Kidney disease Neg Hx   . Learning disabilities Neg Hx   . Mental illness Neg Hx   . Mental retardation Neg Hx   . Miscarriages / Stillbirths Neg Hx   . Stroke Neg Hx   . Vision loss Neg Hx     Social History   Tobacco Use  . Smoking status: Never Smoker  . Smokeless tobacco: Never Used  Vaping Use  . Vaping Use: Never used  Substance Use Topics  . Alcohol use: No  . Drug use: No    Allergies: No Known Allergies  Medications Prior to Admission  Medication Sig Dispense Refill Last Dose  . nitrofurantoin, macrocrystal-monohydrate, (MACROBID)  100 MG capsule Take 1 capsule (100 mg total) by mouth 2 (two) times daily. 14 capsule 0   . Prenatal Vit-Fe Fumarate-FA (PRENATAL VITAMINS) 28-0.8 MG TABS Take 1 tablet by mouth daily. 30 tablet 6 08/11/2020 at Unknown time  . acetaminophen (TYLENOL) 325 MG tablet Take 650 mg by mouth every 6 (six) hours as needed for moderate pain.      . butalbital-acetaminophen-caffeine (FIORICET) 50-325-40 MG tablet Take 2 tablets by mouth every 6 (six) hours as needed for migraine. (Patient not taking: Reported on 07/21/2020) 30 tablet 1   . Misc. Devices MISC Dispense one maternity belt for patient 1 each 0     Review of Systems  Constitutional: Negative for chills and fever.  Gastrointestinal: Positive for abdominal pain. Negative for nausea and vomiting.  Genitourinary: Positive for pelvic pain (Pressure) and vaginal discharge. Negative for difficulty urinating, dysuria and vaginal bleeding.  Neurological: Negative for dizziness, light-headedness and headaches.   Physical Exam   Blood pressure 123/78, pulse 91, temperature 98.7 F (37.1 C), temperature source Oral, resp. rate 20, last menstrual period 11/10/2019, not  currently breastfeeding.  Physical Exam Vitals reviewed.  Constitutional:      Appearance: Normal appearance. She is well-developed.  HENT:     Head: Normocephalic and atraumatic.  Eyes:     Conjunctiva/sclera: Conjunctivae normal.  Cardiovascular:     Rate and Rhythm: Normal rate and regular rhythm.     Heart sounds: Normal heart sounds.  Pulmonary:     Effort: Pulmonary effort is normal. No respiratory distress.     Breath sounds: Normal breath sounds.  Abdominal:     Tenderness: There is no abdominal tenderness (None with palpation).  Genitourinary:    Comments: Wet prep and GC/CT collected via blind swab. Musculoskeletal:        General: Normal range of motion.     Cervical back: Normal range of motion.  Skin:    General: Skin is warm and dry.  Neurological:     Mental Status: She is alert and oriented to person, place, and time.  Psychiatric:        Mood and Affect: Mood normal.        Behavior: Behavior normal.     Fetal Assessment 130 bpm, Mod Var, -Decels, +15x15 Accels Toco: Occasional mild contractions.   MAU Course   Results for orders placed or performed during the hospital encounter of 08/11/20 (from the past 24 hour(s))  Urinalysis, Routine w reflex microscopic Urine, Clean Catch     Status: Abnormal   Collection Time: 08/11/20  6:11 PM  Result Value Ref Range   Color, Urine YELLOW YELLOW   APPearance HAZY (A) CLEAR   Specific Gravity, Urine 1.016 1.005 - 1.030   pH 6.0 5.0 - 8.0   Glucose, UA NEGATIVE NEGATIVE mg/dL   Hgb urine dipstick NEGATIVE NEGATIVE   Bilirubin Urine NEGATIVE NEGATIVE   Ketones, ur NEGATIVE NEGATIVE mg/dL   Protein, ur NEGATIVE NEGATIVE mg/dL   Nitrite NEGATIVE NEGATIVE   Leukocytes,Ua TRACE (A) NEGATIVE   RBC / HPF 0-5 0 - 5 RBC/hpf   WBC, UA 6-10 0 - 5 WBC/hpf   Bacteria, UA FEW (A) NONE SEEN   Squamous Epithelial / LPF 6-10 0 - 5   Mucus PRESENT   Wet prep, genital     Status: Abnormal   Collection Time: 08/11/20   7:14 PM  Result Value Ref Range   Yeast Wet Prep HPF POC NONE SEEN NONE SEEN  Trich, Wet Prep NONE SEEN NONE SEEN   Clue Cells Wet Prep HPF POC NONE SEEN NONE SEEN   WBC, Wet Prep HPF POC FEW (A) NONE SEEN   Sperm NONE SEEN    No results found.  MDM PE Labs: Wet Prep, GC/CT EFM Muscle Relaxant Assessment and Plan  30 [id number]  SIUP at 35.5weeks Cat I FT Pelvic Pain Abdominal Cramping  -POC Reviewed. -Informed that pelvic pressure is common during this stage of pregnancy. -Reviewed some techniques that may improve symptoms including position changes, proper hydration, and frequent rest.  -Exam performed and findings discussed. -Will give flexeril for pain.  Patient states she has someone who can get her if she becomes drowsy. -Will await results and reassess.   Cherre Robins MSN, CNM 08/11/2020, 6:59 PM   Reassessment (8:13 PM)  -Patient reports pain with minimal improvement. States now 6-7/10. -Reviewed tracing and tocometer shows contractions c/w patient complaint of pain/discomfort. -Informed that these are likely BH contractions. Instructed to monitor. -Discussed few bacteria in UA and will send for UC.  -Instructed to keep next appt as scheduled. -Labor precautions given. -Encouraged to go home and rest.  -Encouraged to call or return to MAU if symptoms worsen or with the onset of new symptoms. -Discharged to home in stable condition.  Cherre Robins MSN, CNM Advanced Practice Provider, Center for [employer]

## 2020-08-11 NOTE — Progress Notes (Signed)
ROB c/o pressure and cramping.

## 2020-08-11 NOTE — Progress Notes (Signed)
PRENATAL VISIT NOTE  Subjective:  Sharon Garner is a 30 y.o. [phone] at [redacted]w[redacted]d being seen today for ongoing prenatal care.  She is currently monitored for the following issues for this high-risk pregnancy and has Family history of intellectual disability; Migraine headache without aura; History of C-section; Supervision of other normal pregnancy, antepartum; Short interval between pregnancies affecting pregnancy in third trimester, antepartum; Round ligament pain; Back pain in pregnancy; Obesity in pregnancy; Unwanted fertility; UTI (urinary tract infection) during pregnancy; and [redacted] weeks gestation of pregnancy on their problem list.  Patient doing well with no acute concerns today. She reports backache and pelvic pressure with irregular cramping.  Contractions: Irregular. Vag. Bleeding: None.  Movement: Present. Denies leaking of fluid.   The following portions of the patient's history were reviewed and updated as appropriate: allergies, current medications, past family history, past medical history, past social history, past surgical history and problem list. Problem list updated.  Objective:   Vitals:   08/11/20 1051  BP: 117/79  Pulse: 78  Weight: 182 lb (82.6 kg)    Fetal Status: Fetal Heart Rate (bpm): 141 Fundal Height: 35 cm Movement: Present     General:  Alert, oriented and cooperative. Patient is in no acute distress.  Skin: Skin is warm and dry. No rash noted.   Cardiovascular: Normal heart rate noted  Respiratory: Normal respiratory effort, no problems with respiration noted  Abdomen: Soft, gravid, appropriate for gestational age.  Pain/Pressure: Present     Pelvic: Cervical exam deferred        Extremities: Normal range of motion.  Edema: Trace  Mental Status:  Normal mood and affect. Normal behavior. Normal judgment and thought content.   Assessment and Plan:  Pregnancy: [id number] at [redacted]w[redacted]d  1. [redacted] weeks gestation of pregnancy   2. Supervision of other normal  pregnancy, antepartum Pt would like TOLAC if she has spontaneous labor.  She understands she will not receive pitcin.  VBAC papers signed.  She is already scheduled for repeat c/s with BTL on 5/16.   30 y.o. [id number] at [redacted]w[redacted]d with Estimated Date of Delivery: 09/12/20 was seen today in office to discuss trial of labor after cesarean section (TOLAC) versus elective repeat cesarean delivery (ERCD). The following risks were discussed with the patient.  Risk of uterine rupture at term is 0.78 percent with TOLAC and 0.22 percent with ERCD. 1 in 10 uterine ruptures will result in neonatal death or neurological injury. The benefits of a trial of labor after cesarean (TOLAC) resulting in a vaginal birth after cesarean (VBAC) include the following: shorter length of hospital stay and postpartum recovery (in most cases); fewer complications, such as postpartum fever, wound or uterine infection, thromboembolism (blood clots in the leg or lung), need for blood transfusion and fewer neonatal breathing problems. The risks of an attempted VBAC or TOLAC include the following: . Risk of failed trial of labor after cesarean (TOLAC) without a vaginal birth after cesarean (VBAC) resulting in repeat cesarean delivery (RCD) in about 20 to 40 percent of women who attempt VBAC.  Marland Kitchen Risk of rupture of uterus resulting in an emergency cesarean delivery. The risk of uterine rupture may be related in part to the type of uterine incision made during the first cesarean delivery. A previous transverse uterine incision has the lowest risk of rupture (0.2 to 1.5 percent risk). Vertical or T-shaped uterine incisions have a higher risk of uterine rupture (4 to 9 percent risk)The risk of fetal death is very low  with both VBAC and elective repeat cesarean delivery (ERCD), but the likelihood of fetal death is higher with VBAC than with ERCD. Maternal death is very rare with either type of delivery. The risks of an elective repeat cesarean  delivery (ERCD) were reviewed with the patient including but not limited to: 05/998 risk of uterine rupture which could have serious consequences, bleeding which may require transfusion; infection which may require antibiotics; injury to bowel, bladder or other surrounding organs (bowel, bladder, ureters); injury to the fetus; need for additional procedures including hysterectomy in the event of a life-threatening hemorrhage; thromboembolic phenomenon; abnormal placentation; incisional problems; death and other postoperative or anesthesia complications.    These risks and benefits are summarized on the consent form, which was reviewed with the patient during the visit.  All her questions answered and she signed a consent indicating a preference for TOLAC/ERCD. A copy of the consent was given to the patient.  (She does say that she would want tubal sterilization in the event a cesarean section is performed due to any maternal-fetal indication.)  (Patient also desires bilateral tubal sterilization (BTS) at the time of RCD, also discussed the additional risks of regret, failure rate of 0.5-1% with increased risk of ectopic gestation and permanent and irreversible nature of the procedure.  Also discussed possibility of post-tubal pain syndrome.  Other forms of reversible BCM discussed, also discussed vasectomy, patient desires BTS).  Will continue routine postpartum care.   Obstetrician & Gynecologist, Biochemist, clinical for [employer], [facility]     3. Obesity in pregnancy   4. History of C-section See above  Preterm labor symptoms and general obstetric precautions including but not limited to vaginal bleeding, contractions, leaking of fluid and fetal movement were reviewed in detail with the patient.  Please refer to After Visit Summary for other counseling recommendations.   Return in about 1 week (around 08/18/2020) for [facility], in person, 36 weeks  swabs.   Mariel Aloe, MD Faculty Attending Center for [facility]

## 2020-08-11 NOTE — MAU Note (Signed)
Pt reports to MAU c/o cramping for 2 days that increased in intensity today. +FM. Denies VB or LOF. Pt states vaginal discharge is yellow and white. No recent intercourse.  Pain score: 7/10 FHT; 135

## 2020-08-12 LAB — GC/CHLAMYDIA PROBE AMP (~~LOC~~) NOT AT ARMC
Chlamydia: NEGATIVE
Comment: NEGATIVE
Comment: NORMAL
Neisseria Gonorrhea: NEGATIVE

## 2020-08-13 LAB — CULTURE, OB URINE: Culture: NO GROWTH

## 2020-08-22 ENCOUNTER — Other Ambulatory Visit: Payer: Self-pay

## 2020-08-22 ENCOUNTER — Ambulatory Visit (INDEPENDENT_AMBULATORY_CARE_PROVIDER_SITE_OTHER): Payer: Medicaid Other | Admitting: Obstetrics and Gynecology

## 2020-08-22 ENCOUNTER — Encounter: Payer: Self-pay | Admitting: Obstetrics and Gynecology

## 2020-08-22 VITALS — BP 134/84 | HR 91 | Wt 186.3 lb

## 2020-08-22 DIAGNOSIS — O09893 Supervision of other high risk pregnancies, third trimester: Secondary | ICD-10-CM

## 2020-08-22 DIAGNOSIS — Z3480 Encounter for supervision of other normal pregnancy, unspecified trimester: Secondary | ICD-10-CM | POA: Diagnosis not present

## 2020-08-22 DIAGNOSIS — O99210 Obesity complicating pregnancy, unspecified trimester: Secondary | ICD-10-CM

## 2020-08-22 DIAGNOSIS — Z98891 History of uterine scar from previous surgery: Secondary | ICD-10-CM

## 2020-08-22 MED ORDER — COMFORT FIT MATERNITY SUPP LG MISC: 0 refills | Status: DC

## 2020-08-22 MED ORDER — ZOLPIDEM TARTRATE 5 MG PO TABS: 5.0000 mg | ORAL_TABLET | Freq: Every evening | ORAL | 1 refills | Status: DC | PRN

## 2020-08-22 NOTE — Patient Instructions (Signed)
Sharon Garner  08/22/2020   Your procedure is scheduled on:  09/05/2020  Arrive at 0730 at [employer] [facility] on [address] at [facility]  and [employer]. You are invited to use the FREE valet parking or use the Visitor's parking deck.  Pick up the phone at the desk and dial [phone].  Call this number if you have problems the morning of surgery: [phone]  Remember:   Do not eat food:(After Midnight) Desps de medianoche.  Do not drink clear liquids: (After Midnight) Desps de medianoche.  Take these medicines the morning of surgery with A SIP OF WATER:  none   Do not wear jewelry, make-up or nail polish.  Do not wear lotions, powders, or perfumes. Do not wear deodorant.  Do not shave 48 hours prior to surgery.  Do not bring valuables to the hospital.  [facility] is not   responsible for any belongings or valuables brought to the hospital.  Contacts, dentures or bridgework may not be worn into surgery.  Leave suitcase in the car. After surgery it may be brought to your room.  For patients admitted to the hospital, checkout time is 11:00 AM the day of              discharge.      Please read over the following fact sheets that you were given:     Preparing for Surgery

## 2020-08-22 NOTE — Progress Notes (Signed)
   PRENATAL VISIT NOTE  Subjective:  Sharon Garner is a 30 y.o. [phone] at [redacted]w[redacted]d being seen today for ongoing prenatal care.  She is currently monitored for the following issues for this low-risk pregnancy and has Family history of intellectual disability; Migraine headache without aura; History of C-section; Supervision of other normal pregnancy, antepartum; Short interval between pregnancies affecting pregnancy in third trimester, antepartum; Round ligament pain; Back pain in pregnancy; Obesity in pregnancy; Unwanted fertility; UTI (urinary tract infection) during pregnancy; and [redacted] weeks gestation of pregnancy on their problem list.  Patient reports sciatic nerve pain.  Contractions: Irritability. Vag. Bleeding: None.  Movement: Present. Denies leaking of fluid.   The following portions of the patient's history were reviewed and updated as appropriate: allergies, current medications, past family history, past medical history, past social history, past surgical history and problem list.   Objective:   Vitals:   08/22/20 1542  BP: 134/84  Pulse: 91  Weight: 186 lb 4.8 oz (84.5 kg)    Fetal Status: Fetal Heart Rate (bpm): 138 Fundal Height: 37 cm Movement: Present  Presentation: Vertex  General:  Alert, oriented and cooperative. Patient is in no acute distress.  Skin: Skin is warm and dry. No rash noted.   Cardiovascular: Normal heart rate noted  Respiratory: Normal respiratory effort, no problems with respiration noted  Abdomen: Soft, gravid, appropriate for gestational age.  Pain/Pressure: Present     Pelvic: Cervical exam performed in the presence of a chaperone Dilation: 1.5 Effacement (%): 50 Station: -3  Extremities: Normal range of motion.  Edema: Trace  Mental Status: Normal mood and affect. Normal behavior. Normal judgment and thought content.   Assessment and Plan:  Pregnancy: [id number] at [redacted]w[redacted]d 1. Supervision of other normal pregnancy, antepartum Patient is doing  well GBS culture today Rx maternity support belt provided  2. Short interval between pregnancies affecting pregnancy in third trimester, antepartum   3. History of C-section Scheduled for repeat at 39 weeks  4. Obesity in pregnancy   Term labor symptoms and general obstetric precautions including but not limited to vaginal bleeding, contractions, leaking of fluid and fetal movement were reviewed in detail with the patient. Please refer to After Visit Summary for other counseling recommendations.   Return in about 1 week (around 08/29/2020) for in person, ROB, Low risk.  Future Appointments  Date Time Provider Department Center  09/02/2020  9:15 AM [facility] None  09/02/2020 10:00 AM [facility] PAT 1 [facility] None    Catalina Antigua, MD

## 2020-08-22 NOTE — Progress Notes (Signed)
Pt presents for ROB and GBS. GC/CT negative on 08/11/20. Pt reports R hip pain radiating down to R leg.

## 2020-08-23 ENCOUNTER — Encounter (HOSPITAL_COMMUNITY): Payer: Self-pay

## 2020-08-24 LAB — STREP GP B NAA: Strep Gp B NAA: NEGATIVE

## 2020-08-30 ENCOUNTER — Other Ambulatory Visit: Payer: Self-pay

## 2020-08-30 ENCOUNTER — Ambulatory Visit (INDEPENDENT_AMBULATORY_CARE_PROVIDER_SITE_OTHER): Payer: Medicaid Other | Admitting: Advanced Practice Midwife

## 2020-08-30 VITALS — BP 128/81 | HR 84 | Wt 183.0 lb

## 2020-08-30 DIAGNOSIS — Z3A38 38 weeks gestation of pregnancy: Secondary | ICD-10-CM

## 2020-08-30 DIAGNOSIS — Z98891 History of uterine scar from previous surgery: Secondary | ICD-10-CM

## 2020-08-30 DIAGNOSIS — Z3480 Encounter for supervision of other normal pregnancy, unspecified trimester: Secondary | ICD-10-CM

## 2020-08-30 NOTE — Progress Notes (Signed)
   PRENATAL VISIT NOTE  Subjective:  Sharon Garner is a 30 y.o. [phone] at [redacted]w[redacted]d being seen today for ongoing prenatal care.  She is currently monitored for the following issues for this low-risk pregnancy and has Family history of intellectual disability; Migraine headache without aura; History of C-section; Supervision of other normal pregnancy, antepartum; Short interval between pregnancies affecting pregnancy in third trimester, antepartum; Round ligament pain; Back pain in pregnancy; Obesity in pregnancy; Unwanted fertility; UTI (urinary tract infection) during pregnancy; and [redacted] weeks gestation of pregnancy on their problem list.  Patient reports occasional contractions.  Contractions: Irritability. Vag. Bleeding: None.  Movement: Present. Denies leaking of fluid.   The following portions of the patient's history were reviewed and updated as appropriate: allergies, current medications, past family history, past medical history, past social history, past surgical history and problem list.   Objective:   Vitals:   08/30/20 1548  BP: 128/81  Pulse: 84  Weight: 183 lb (83 kg)    Fetal Status: Fetal Heart Rate (bpm): 145 Fundal Height: 38 cm Movement: Present  Presentation: Vertex  General:  Alert, oriented and cooperative. Patient is in no acute distress.  Skin: Skin is warm and dry. No rash noted.   Cardiovascular: Normal heart rate noted  Respiratory: Normal respiratory effort, no problems with respiration noted  Abdomen: Soft, gravid, appropriate for gestational age.  Pain/Pressure: Present     Pelvic: Cervical exam performed in the presence of a chaperone Dilation: 1.5 Effacement (%): 50 Station: -3  Extremities: Normal range of motion.  Edema: Trace  Mental Status: Normal mood and affect. Normal behavior. Normal judgment and thought content.   Assessment and Plan:  Pregnancy: [id number] at [redacted]w[redacted]d 1. Supervision of other normal pregnancy, antepartum --Anticipatory guidance  about next visits/weeks of pregnancy given. --Pt to try the [employer] for optimal fetal positioning --Reviewed s/sx of labor, warning signs, reasons to seek care --Next visit in 1 week  2. History of 2 cesarean sections --Desires VBAC, consent signed. Pt will need to begin labor spontaneously.    3. [redacted] weeks gestation of pregnancy  Term labor symptoms and general obstetric precautions including but not limited to vaginal bleeding, contractions, leaking of fluid and fetal movement were reviewed in detail with the patient. Please refer to After Visit Summary for other counseling recommendations.   Return in about 1 week (around 09/06/2020).  Future Appointments  Date Time Provider Department Center  09/02/2020  9:15 AM [facility] None  09/02/2020 10:00 AM [facility] PAT 1 [facility] None  09/06/2020 10:00 AM Brock Bad, MD [facility] None    Sharen Counter, CNM

## 2020-08-30 NOTE — Progress Notes (Signed)
+   Fetal movement. Pt c/o increased pressure and contractions. Request cervical check.

## 2020-08-30 NOTE — Patient Instructions (Addendum)
Try the [employer] at [email or web address].[email or web address]  Labor Precautions Reasons to come to [facility]U at [facility]:  1.  Contractions are  5 minutes apart or less, each last 1 minute, these have been going on for 1-2 hours, and you cannot walk or talk during them 2.  You have a large gush of fluid, or a trickle of fluid that will not stop and you have to wear a pad 3.  You have bleeding that is bright red, heavier than spotting--like menstrual bleeding (spotting can be normal in early labor or after a check of your cervix) 4.  You do not feel the baby moving like he/she normally does

## 2020-09-02 ENCOUNTER — Encounter (HOSPITAL_COMMUNITY)
Admission: RE | Admit: 2020-09-02 | Discharge: 2020-09-02 | Disposition: A | Discharge status: Home / Self Care | Payer: Medicaid Other | Source: Ambulatory Visit | Attending: Obstetrics and Gynecology | Admitting: Obstetrics and Gynecology

## 2020-09-02 ENCOUNTER — Other Ambulatory Visit (HOSPITAL_COMMUNITY)
Admission: RE | Admit: 2020-09-02 | Discharge: 2020-09-02 | Disposition: A | Discharge status: Home / Self Care | Payer: Medicaid Other | Source: Ambulatory Visit | Attending: Obstetrics and Gynecology | Admitting: Obstetrics and Gynecology

## 2020-09-02 DIAGNOSIS — Z20822 Contact with and (suspected) exposure to covid-19: Secondary | ICD-10-CM | POA: Diagnosis not present

## 2020-09-02 DIAGNOSIS — Z01812 Encounter for preprocedural laboratory examination: Secondary | ICD-10-CM | POA: Insufficient documentation

## 2020-09-02 LAB — SARS CORONAVIRUS 2 (TAT 6-24 HRS): SARS Coronavirus 2: NEGATIVE

## 2020-09-02 NOTE — Pre-Procedure Instructions (Signed)
Arrived for her bloodwork with a child.  Pt has completed her covid test.  Revised instructions to arrive 3 hours early so bloodwork can be done day of surgery.  Reviewed instructions with patient in lobby with verbalized understanding.

## 2020-09-05 ENCOUNTER — Encounter (HOSPITAL_COMMUNITY)
Admission: AD | Disposition: A | Discharge status: Home / Self Care | Payer: Self-pay | Source: Home / Self Care | Attending: Obstetrics and Gynecology

## 2020-09-05 ENCOUNTER — Inpatient Hospital Stay (HOSPITAL_COMMUNITY): Payer: Medicaid Other | Admitting: Anesthesiology

## 2020-09-05 ENCOUNTER — Other Ambulatory Visit: Payer: Self-pay

## 2020-09-05 ENCOUNTER — Encounter (HOSPITAL_COMMUNITY): Payer: Self-pay | Admitting: Obstetrics and Gynecology

## 2020-09-05 ENCOUNTER — Inpatient Hospital Stay (HOSPITAL_COMMUNITY)
Admission: AD | Admit: 2020-09-05 | Discharge: 2020-09-07 | DRG: 785 | Disposition: A | Discharge status: Home / Self Care | Payer: Medicaid Other | Attending: Obstetrics and Gynecology | Admitting: Obstetrics and Gynecology

## 2020-09-05 DIAGNOSIS — Z3A39 39 weeks gestation of pregnancy: Secondary | ICD-10-CM | POA: Diagnosis not present

## 2020-09-05 DIAGNOSIS — Z98891 History of uterine scar from previous surgery: Secondary | ICD-10-CM

## 2020-09-05 DIAGNOSIS — Z302 Encounter for sterilization: Secondary | ICD-10-CM

## 2020-09-05 DIAGNOSIS — O0943 Supervision of pregnancy with grand multiparity, third trimester: Secondary | ICD-10-CM | POA: Diagnosis not present

## 2020-09-05 DIAGNOSIS — O34211 Maternal care for low transverse scar from previous cesarean delivery: Secondary | ICD-10-CM | POA: Diagnosis not present

## 2020-09-05 DIAGNOSIS — O09893 Supervision of other high risk pregnancies, third trimester: Secondary | ICD-10-CM

## 2020-09-05 DIAGNOSIS — Z20822 Contact with and (suspected) exposure to covid-19: Secondary | ICD-10-CM | POA: Diagnosis present

## 2020-09-05 DIAGNOSIS — O99214 Obesity complicating childbirth: Secondary | ICD-10-CM | POA: Diagnosis present

## 2020-09-05 DIAGNOSIS — O2340 Unspecified infection of urinary tract in pregnancy, unspecified trimester: Secondary | ICD-10-CM | POA: Diagnosis present

## 2020-09-05 LAB — TYPE AND SCREEN
ABO/RH(D): A POS
Antibody Screen: NEGATIVE

## 2020-09-05 LAB — CBC
HCT: 36.9 % (ref 36.0–46.0)
Hemoglobin: 12.1 g/dL (ref 12.0–15.0)
MCH: 27 pg (ref 26.0–34.0)
MCHC: 32.8 g/dL (ref 30.0–36.0)
MCV: 82.4 fL (ref 80.0–100.0)
Platelets: 249 10*3/uL (ref 150–400)
RBC: 4.48 MIL/uL (ref 3.87–5.11)
RDW: 16.6 % — ABNORMAL HIGH (ref 11.5–15.5)
WBC: 8.8 10*3/uL (ref 4.0–10.5)
nRBC: 0 % (ref 0.0–0.2)

## 2020-09-05 LAB — RPR: RPR Ser Ql: NONREACTIVE

## 2020-09-05 SURGERY — Surgical Case
Anesthesia: Spinal

## 2020-09-05 MED ORDER — OXYCODONE HCL 5 MG/5ML PO SOLN: 5.0000 mg | Freq: Once | ORAL | Status: DC | PRN

## 2020-09-05 MED ORDER — DIPHENHYDRAMINE HCL 25 MG PO CAPS: 25.0000 mg | ORAL_CAPSULE | Freq: Four times a day (QID) | ORAL | Status: DC | PRN

## 2020-09-05 MED ORDER — FENTANYL CITRATE (PF) 100 MCG/2ML IJ SOLN
INTRAMUSCULAR | Status: DC | PRN
  Administered 2020-09-05: 15 ug via INTRATHECAL

## 2020-09-05 MED ORDER — ONDANSETRON HCL 4 MG/2ML IJ SOLN
INTRAMUSCULAR | Status: AC
  Filled 2020-09-05: qty 2

## 2020-09-05 MED ORDER — CEFAZOLIN SODIUM-DEXTROSE 2-3 GM-%(50ML) IV SOLR
INTRAVENOUS | Status: DC | PRN
  Administered 2020-09-05: 2 g via INTRAVENOUS

## 2020-09-05 MED ORDER — CHLORHEXIDINE GLUCONATE 0.12 % MT SOLN
15.0000 mL | Freq: Once | OROMUCOSAL | Status: AC
  Administered 2020-09-05: 15 mL via OROMUCOSAL

## 2020-09-05 MED ORDER — SCOPOLAMINE 1 MG/3DAYS TD PT72: 1.0000 | MEDICATED_PATCH | Freq: Once | TRANSDERMAL | Status: DC

## 2020-09-05 MED ORDER — OXYTOCIN-SODIUM CHLORIDE 30-0.9 UT/500ML-% IV SOLN
2.5000 [IU]/h | INTRAVENOUS | Status: DC
  Filled 2020-09-05: qty 500

## 2020-09-05 MED ORDER — ENOXAPARIN SODIUM 40 MG/0.4ML IJ SOSY
40.0000 mg | PREFILLED_SYRINGE | INTRAMUSCULAR | Status: DC
  Administered 2020-09-06 – 2020-09-07 (×2): 40 mg via SUBCUTANEOUS
  Filled 2020-09-05 (×2): qty 0.4

## 2020-09-05 MED ORDER — ONDANSETRON HCL 4 MG/2ML IJ SOLN: 4.0000 mg | Freq: Three times a day (TID) | INTRAMUSCULAR | Status: DC | PRN

## 2020-09-05 MED ORDER — LACTATED RINGERS IV SOLN: INTRAVENOUS | Status: AC

## 2020-09-05 MED ORDER — DEXAMETHASONE SODIUM PHOSPHATE 4 MG/ML IJ SOLN
INTRAMUSCULAR | Status: DC | PRN
  Administered 2020-09-05: 8 mg via INTRAVENOUS

## 2020-09-05 MED ORDER — PRENATAL MULTIVITAMIN CH
1.0000 | ORAL_TABLET | Freq: Every day | ORAL | Status: DC
  Administered 2020-09-06 – 2020-09-07 (×2): 1 via ORAL
  Filled 2020-09-05 (×2): qty 1

## 2020-09-05 MED ORDER — DIBUCAINE (PERIANAL) 1 % EX OINT: 1.0000 "application " | TOPICAL_OINTMENT | CUTANEOUS | Status: DC | PRN

## 2020-09-05 MED ORDER — FENTANYL CITRATE (PF) 100 MCG/2ML IJ SOLN: 25.0000 ug | INTRAMUSCULAR | Status: DC | PRN

## 2020-09-05 MED ORDER — OXYTOCIN-SODIUM CHLORIDE 30-0.9 UT/500ML-% IV SOLN
INTRAVENOUS | Status: AC
  Filled 2020-09-05: qty 500

## 2020-09-05 MED ORDER — OXYCODONE HCL 5 MG PO TABS
5.0000 mg | ORAL_TABLET | ORAL | Status: DC | PRN
  Administered 2020-09-06 – 2020-09-07 (×2): 5 mg via ORAL
  Filled 2020-09-05 (×2): qty 1

## 2020-09-05 MED ORDER — SCOPOLAMINE 1 MG/3DAYS TD PT72
MEDICATED_PATCH | TRANSDERMAL | Status: AC
  Filled 2020-09-05: qty 1

## 2020-09-05 MED ORDER — DIPHENHYDRAMINE HCL 50 MG/ML IJ SOLN
12.5000 mg | INTRAMUSCULAR | Status: DC | PRN
  Administered 2020-09-05: 12.5 mg via INTRAVENOUS

## 2020-09-05 MED ORDER — MORPHINE SULFATE (PF) 0.5 MG/ML IJ SOLN
INTRAMUSCULAR | Status: AC
  Filled 2020-09-05: qty 10

## 2020-09-05 MED ORDER — WITCH HAZEL-GLYCERIN EX PADS: 1.0000 "application " | MEDICATED_PAD | CUTANEOUS | Status: DC | PRN

## 2020-09-05 MED ORDER — NALOXONE HCL 0.4 MG/ML IJ SOLN: 0.4000 mg | INTRAMUSCULAR | Status: DC | PRN

## 2020-09-05 MED ORDER — NALBUPHINE HCL 10 MG/ML IJ SOLN
5.0000 mg | INTRAMUSCULAR | Status: DC | PRN
Start: 2020-09-05 — End: 2020-09-07

## 2020-09-05 MED ORDER — FAMOTIDINE 20 MG PO TABS
ORAL_TABLET | ORAL | Status: AC
  Filled 2020-09-05: qty 1

## 2020-09-05 MED ORDER — BUPIVACAINE IN DEXTROSE 0.75-8.25 % IT SOLN
INTRATHECAL | Status: DC | PRN
  Administered 2020-09-05: 1.6 mL via INTRATHECAL

## 2020-09-05 MED ORDER — PROMETHAZINE HCL 25 MG/ML IJ SOLN: 6.2500 mg | INTRAMUSCULAR | Status: DC | PRN

## 2020-09-05 MED ORDER — SODIUM CHLORIDE 0.9% FLUSH: 3.0000 mL | INTRAVENOUS | Status: DC | PRN

## 2020-09-05 MED ORDER — ACETAMINOPHEN 500 MG PO TABS
1000.0000 mg | ORAL_TABLET | Freq: Four times a day (QID) | ORAL | Status: DC
  Administered 2020-09-05 – 2020-09-07 (×8): 1000 mg via ORAL
  Filled 2020-09-05 (×8): qty 2

## 2020-09-05 MED ORDER — LACTATED RINGERS IV SOLN: INTRAVENOUS | Status: DC

## 2020-09-05 MED ORDER — MORPHINE SULFATE (PF) 0.5 MG/ML IJ SOLN
INTRAMUSCULAR | Status: DC | PRN
  Administered 2020-09-05: 150 ug via INTRATHECAL

## 2020-09-05 MED ORDER — NALBUPHINE HCL 10 MG/ML IJ SOLN: 5.0000 mg | Freq: Once | INTRAMUSCULAR | Status: DC | PRN

## 2020-09-05 MED ORDER — KETOROLAC TROMETHAMINE 30 MG/ML IJ SOLN
30.0000 mg | Freq: Four times a day (QID) | INTRAMUSCULAR | Status: AC | PRN
  Administered 2020-09-05: 30 mg via INTRAVENOUS

## 2020-09-05 MED ORDER — SOD CITRATE-CITRIC ACID 500-334 MG/5ML PO SOLN
30.0000 mL | Freq: Once | ORAL | Status: AC
  Administered 2020-09-05: 30 mL via ORAL

## 2020-09-05 MED ORDER — CEFAZOLIN SODIUM-DEXTROSE 2-4 GM/100ML-% IV SOLN: 2.0000 g | INTRAVENOUS | Status: DC

## 2020-09-05 MED ORDER — DIPHENHYDRAMINE HCL 50 MG/ML IJ SOLN
INTRAMUSCULAR | Status: AC
  Filled 2020-09-05: qty 1

## 2020-09-05 MED ORDER — MENTHOL 3 MG MT LOZG: 1.0000 | LOZENGE | OROMUCOSAL | Status: DC | PRN

## 2020-09-05 MED ORDER — IBUPROFEN 600 MG PO TABS
600.0000 mg | ORAL_TABLET | Freq: Four times a day (QID) | ORAL | Status: DC
  Administered 2020-09-06 – 2020-09-07 (×4): 600 mg via ORAL
  Filled 2020-09-05 (×4): qty 1

## 2020-09-05 MED ORDER — KETOROLAC TROMETHAMINE 30 MG/ML IJ SOLN: 30.0000 mg | Freq: Four times a day (QID) | INTRAMUSCULAR | Status: AC | PRN

## 2020-09-05 MED ORDER — OXYTOCIN-SODIUM CHLORIDE 30-0.9 UT/500ML-% IV SOLN
INTRAVENOUS | Status: DC | PRN
  Administered 2020-09-05: 300 mL via INTRAVENOUS

## 2020-09-05 MED ORDER — SIMETHICONE 80 MG PO CHEW: 80.0000 mg | CHEWABLE_TABLET | ORAL | Status: DC | PRN

## 2020-09-05 MED ORDER — KETOROLAC TROMETHAMINE 30 MG/ML IJ SOLN
30.0000 mg | Freq: Four times a day (QID) | INTRAMUSCULAR | Status: AC
  Administered 2020-09-05 – 2020-09-06 (×4): 30 mg via INTRAVENOUS
  Filled 2020-09-05 (×4): qty 1

## 2020-09-05 MED ORDER — PHENYLEPHRINE HCL-NACL 20-0.9 MG/250ML-% IV SOLN
INTRAVENOUS | Status: DC | PRN
  Administered 2020-09-05: 60 ug/min via INTRAVENOUS

## 2020-09-05 MED ORDER — OXYCODONE HCL 5 MG PO TABS: 5.0000 mg | ORAL_TABLET | Freq: Once | ORAL | Status: DC | PRN

## 2020-09-05 MED ORDER — PHENYLEPHRINE 40 MCG/ML (10ML) SYRINGE FOR IV PUSH (FOR BLOOD PRESSURE SUPPORT)
PREFILLED_SYRINGE | INTRAVENOUS | Status: AC
  Filled 2020-09-05: qty 10

## 2020-09-05 MED ORDER — NALOXONE HCL 4 MG/10ML IJ SOLN
1.0000 ug/kg/h | INTRAVENOUS | Status: DC | PRN
  Filled 2020-09-05: qty 5

## 2020-09-05 MED ORDER — CEFAZOLIN SODIUM-DEXTROSE 2-4 GM/100ML-% IV SOLN
INTRAVENOUS | Status: AC
  Filled 2020-09-05: qty 100

## 2020-09-05 MED ORDER — SOD CITRATE-CITRIC ACID 500-334 MG/5ML PO SOLN
ORAL | Status: AC
  Filled 2020-09-05: qty 30

## 2020-09-05 MED ORDER — TETANUS-DIPHTH-ACELL PERTUSSIS 5-2.5-18.5 LF-MCG/0.5 IM SUSY: 0.5000 mL | PREFILLED_SYRINGE | Freq: Once | INTRAMUSCULAR | Status: DC

## 2020-09-05 MED ORDER — SIMETHICONE 80 MG PO CHEW
80.0000 mg | CHEWABLE_TABLET | Freq: Three times a day (TID) | ORAL | Status: DC
  Administered 2020-09-05 – 2020-09-07 (×6): 80 mg via ORAL
  Filled 2020-09-05 (×6): qty 1

## 2020-09-05 MED ORDER — SCOPOLAMINE 1 MG/3DAYS TD PT72
1.0000 | MEDICATED_PATCH | Freq: Once | TRANSDERMAL | Status: DC
  Administered 2020-09-05: 1.5 mg via TRANSDERMAL

## 2020-09-05 MED ORDER — FENTANYL CITRATE (PF) 100 MCG/2ML IJ SOLN
INTRAMUSCULAR | Status: AC
  Filled 2020-09-05: qty 2

## 2020-09-05 MED ORDER — COCONUT OIL OIL: 1.0000 "application " | TOPICAL_OIL | Status: DC | PRN

## 2020-09-05 MED ORDER — POVIDONE-IODINE 10 % EX SWAB
2.0000 "application " | Freq: Once | CUTANEOUS | Status: AC
  Administered 2020-09-05: 2 via TOPICAL

## 2020-09-05 MED ORDER — PHENYLEPHRINE HCL (PRESSORS) 10 MG/ML IV SOLN
INTRAVENOUS | Status: DC | PRN
  Administered 2020-09-05: 120 ug via INTRAVENOUS

## 2020-09-05 MED ORDER — ONDANSETRON HCL 4 MG/2ML IJ SOLN
INTRAMUSCULAR | Status: DC | PRN
  Administered 2020-09-05: 4 mg via INTRAVENOUS

## 2020-09-05 MED ORDER — METOCLOPRAMIDE HCL 5 MG/ML IJ SOLN
INTRAMUSCULAR | Status: DC | PRN
  Administered 2020-09-05: 10 mg via INTRAVENOUS

## 2020-09-05 MED ORDER — CHLORHEXIDINE GLUCONATE 0.12 % MT SOLN
OROMUCOSAL | Status: AC
  Filled 2020-09-05: qty 15

## 2020-09-05 MED ORDER — METOCLOPRAMIDE HCL 5 MG/ML IJ SOLN
INTRAMUSCULAR | Status: AC
  Filled 2020-09-05: qty 2

## 2020-09-05 MED ORDER — PHENYLEPHRINE HCL-NACL 20-0.9 MG/250ML-% IV SOLN
INTRAVENOUS | Status: AC
  Filled 2020-09-05: qty 250

## 2020-09-05 MED ORDER — ORAL CARE MOUTH RINSE: 15.0000 mL | Freq: Once | OROMUCOSAL | Status: AC

## 2020-09-05 MED ORDER — LACTATED RINGERS IV SOLN: INTRAVENOUS | Status: DC | PRN

## 2020-09-05 MED ORDER — SENNOSIDES-DOCUSATE SODIUM 8.6-50 MG PO TABS
2.0000 | ORAL_TABLET | Freq: Every day | ORAL | Status: DC
  Administered 2020-09-06 – 2020-09-07 (×2): 2 via ORAL
  Filled 2020-09-05 (×2): qty 2

## 2020-09-05 MED ORDER — NALBUPHINE HCL 10 MG/ML IJ SOLN: 5.0000 mg | INTRAMUSCULAR | Status: DC | PRN

## 2020-09-05 MED ORDER — MEPERIDINE HCL 25 MG/ML IJ SOLN: 6.2500 mg | INTRAMUSCULAR | Status: DC | PRN

## 2020-09-05 MED ORDER — FAMOTIDINE 20 MG PO TABS
20.0000 mg | ORAL_TABLET | Freq: Once | ORAL | Status: AC
  Administered 2020-09-05: 20 mg via ORAL

## 2020-09-05 MED ORDER — DEXAMETHASONE SODIUM PHOSPHATE 4 MG/ML IJ SOLN
INTRAMUSCULAR | Status: AC
  Filled 2020-09-05: qty 2

## 2020-09-05 MED ORDER — KETOROLAC TROMETHAMINE 30 MG/ML IJ SOLN
INTRAMUSCULAR | Status: AC
  Filled 2020-09-05: qty 1

## 2020-09-05 MED ORDER — DIPHENHYDRAMINE HCL 25 MG PO CAPS: 25.0000 mg | ORAL_CAPSULE | ORAL | Status: DC | PRN

## 2020-09-05 SURGICAL SUPPLY — 30 items
BENZOIN TINCTURE PRP APPL 2/3 (GAUZE/BANDAGES/DRESSINGS) ×2 IMPLANT
CLOTH BEACON ORANGE TIMEOUT ST (SAFETY) ×2 IMPLANT
DRSG OPSITE POSTOP 4X10 (GAUZE/BANDAGES/DRESSINGS) ×2 IMPLANT
ELECT REM PT RETURN 9FT ADLT (ELECTROSURGICAL) ×2
ELECTRODE REM PT RTRN 9FT ADLT (ELECTROSURGICAL) ×1 IMPLANT
EXTRACTOR VACUUM KIWI (MISCELLANEOUS) ×4 IMPLANT
GLOVE BIOGEL PI IND STRL 7.0 (GLOVE) ×1 IMPLANT
GLOVE BIOGEL PI INDICATOR 7.0 (GLOVE) ×1
GLOVE SURG ORTHO 8.0 STRL STRW (GLOVE) ×2 IMPLANT
GOWN STRL REUS W/TWL LRG LVL3 (GOWN DISPOSABLE) ×4 IMPLANT
KIT ABG SYR 3ML LUER SLIP (SYRINGE) IMPLANT
NEEDLE HYPO 25X5/8 SAFETYGLIDE (NEEDLE) IMPLANT
NS IRRIG 1000ML POUR BTL (IV SOLUTION) ×2 IMPLANT
PACK C SECTION WH (CUSTOM PROCEDURE TRAY) ×2 IMPLANT
PAD OB MATERNITY 4.3X12.25 (PERSONAL CARE ITEMS) ×2 IMPLANT
PENCIL SMOKE EVAC W/HOLSTER (ELECTROSURGICAL) ×2 IMPLANT
RTRCTR C-SECT PINK 25CM LRG (MISCELLANEOUS) IMPLANT
STRIP CLOSURE SKIN 1/2X4 (GAUZE/BANDAGES/DRESSINGS) IMPLANT
STRIP SURGICAL 1/4 X 6 IN (GAUZE/BANDAGES/DRESSINGS) ×2 IMPLANT
SUT MON AB-0 CT1 36 (SUTURE) ×4 IMPLANT
SUT PLAIN 0 NONE (SUTURE) IMPLANT
SUT PLAIN 2 0 XLH (SUTURE) ×4 IMPLANT
SUT VIC AB 0 CT1 27 (SUTURE) ×2
SUT VIC AB 0 CT1 27XBRD ANBCTR (SUTURE) ×2 IMPLANT
SUT VIC AB 2-0 CT1 27 (SUTURE) ×1
SUT VIC AB 2-0 CT1 TAPERPNT 27 (SUTURE) ×1 IMPLANT
SUT VIC AB 4-0 KS 27 (SUTURE) ×2 IMPLANT
TOWEL OR 17X24 6PK STRL BLUE (TOWEL DISPOSABLE) ×2 IMPLANT
TRAY FOLEY W/BAG SLVR 14FR LF (SET/KITS/TRAYS/PACK) ×2 IMPLANT
WATER STERILE IRR 1000ML POUR (IV SOLUTION) ×2 IMPLANT

## 2020-09-05 NOTE — Op Note (Signed)
Sharon Garner   PROCEDURE DATE: 09/05/2020  PREOPERATIVE DIAGNOSES: Intrauterine pregnancy at [redacted]w[redacted]d weeks gestation; History of Cesarean x2, Unwanted fertility, Multiparity  POSTOPERATIVE DIAGNOSES: The same  PROCEDURE: Repeat Low Transverse Cesarean Section, Vacuum Assisted with Bilateral Tubal Ligation using Pomeroy Method  SURGEON:  Dr. Mariel Aloe, MD  ASSISTANT:  Dr. Lynnda Shields, MD  ANESTHESIOLOGY TEAM: Anesthesiologist: Beryle Lathe, MD CRNA: Graciela Husbands, CRNA  INDICATIONS: Sharon Garner is a 30 y.o. [phone] at [redacted]w[redacted]d here for cesarean section secondary to the indications listed under preoperative diagnoses; please see preoperative note for further details. The risks of cesarean section were discussed with the patient including but were not limited to: bleeding which may require transfusion or reoperation; infection which may require antibiotics; injury to bowel, bladder, ureters or other surrounding organs; injury to the fetus; need for additional procedures including hysterectomy in the event of a life-threatening hemorrhage; placental abnormalities wth subsequent pregnancies, incisional problems, thromboembolic phenomenon and other postoperative/anesthesia complications.  Patient also desires permanent sterilization.  Other reversible forms of contraception were discussed with patient; she declines all other modalities. Risks of procedure discussed with patient including but not limited to: risk of regret, permanence of method, bleeding, infection, injury to surrounding organs and need for additional procedures.  Failure risk of about 1% with increased risk of ectopic gestation if pregnancy occurs was also discussed with patient.  Also discussed possibility of post-tubal pain syndrome. The patient concurred with the proposed plan, giving informed written consent for the procedures.    FINDINGS:  Viable female infant in cephalic presentation.  Apgars 9 and 9.  Light  meconium stained amniotic fluid.  Intact placenta, three vessel cord.  Normal uterus, fallopian tubes and ovaries bilaterally. Moderate adhesions along lower uterine segment.  ANESTHESIA: Spinal INTRAVENOUS FLUIDS: 2300 ml   ESTIMATED BLOOD LOSS: 277 ml URINE OUTPUT: 100 ml SPECIMENS: Placenta sent to L&D COMPLICATIONS: None immediate  PROCEDURE IN DETAIL:  The patient preoperatively received intravenous antibiotics (ancef) and had sequential compression devices applied to her lower extremities.  She was then taken to the operating room where spinal anesthesia was administered and was found to be adequate. She was then placed in a dorsal supine position with a leftward tilt, and prepped and draped in a sterile manner.  A foley catheter was placed into her bladder and attached to constant gravity.  After an adequate timeout was performed, a Pfannenstiel skin incision was made with scalpel on her preexisting scar and carried through to the underlying layer of fascia. The fascia was incised in the midline, and this incision was extended bilaterally using the Bovie and Mayo scissors.  Kocher clamps were applied to the superior aspect of the fascial incision and the underlying rectus muscles were dissected off bluntly and sharply.  A similar process was carried out on the inferior aspect of the fascial incision. The rectus muscles were separated in the midline and the peritoneum was entered sharply and bluntly. The Alexis self-retaining retractor was introduced into the abdominal cavity.  Attention was turned to the lower uterine segment where a bladder flap was created sharply and bluntly. A low transverse hysterotomy was then made with a scalpel and extended bilaterally bluntly.  The infant was successfully delivered, assisted by Kiwi vacuum. The cord was clamped and cut after one minute, and the infant was handed over to the awaiting neonatology team. Uterine massage was then administered, and the placenta  delivered intact with a three-vessel cord. The uterus was then cleared of  clots and debris and exteriorized.  The hysterotomy was closed with 0 Monocryl in a running locked fashion. Several figure-of-eight 0 Monocryl serosal stitches were placed to help with hemostasis.    The left Fallopian tube was identified, grasped with the Babcock clamps. An avascular midsection of the tube approximately 3-4cm from the cornua was grasped with the babcock clamps and brought into a knuckle. The tube was double ligated with one 0 plain gut suture and the intervening portion of tube was transected and removed.  Attention was then turned to the right fallopian tube after confirmation of identification by tracing the tube out to the fimbriae. The same procedure was then performed on the right Fallopian tube, with excellent hemostasis noted at both BTL sites at the end of the procedure.  The pelvis was irrigated and cleared of all clot and debris. Hemostasis was confirmed on all surfaces. The retractor was removed.  The fascia was then closed using 0 Vicryl in a running fashion.  The subcutaneous layer was irrigated, and the skin was closed with a 4-0 Vicryl subcuticular stitch. The patient tolerated the procedure well. Sponge, instrument and needle counts were correct x 3.  She was taken to the recovery room in stable condition.   Sharon Oats, MD OB Fellow, Faculty Practice 09/05/2020 10:14 AM

## 2020-09-05 NOTE — Discharge Instructions (Signed)

## 2020-09-05 NOTE — Anesthesia Procedure Notes (Signed)
Spinal  Patient location during procedure: OR Start time: 09/05/2020 9:03 AM End time: 09/05/2020 9:06 AM Reason for block: surgical anesthesia Staffing Performed: anesthesiologist  Anesthesiologist: Beryle Lathe, MD Preanesthetic Checklist Completed: patient identified, IV checked, risks and benefits discussed, surgical consent, monitors and equipment checked, pre-op evaluation and timeout performed Spinal Block Patient position: sitting Prep: DuraPrep Patient monitoring: heart rate, cardiac monitor, continuous pulse ox and blood pressure Approach: midline Location: L3-4 Injection technique: single-shot Needle Needle type: Pencan  Needle gauge: 24 G Additional Notes Consent was obtained prior to the procedure with all questions answered and concerns addressed. Risks including, but not limited to, bleeding, infection, nerve damage, paralysis, failed block, inadequate analgesia, allergic reaction, high spinal, itching, and headache were discussed and the patient wished to proceed. Functioning IV was confirmed and monitors were applied. Sterile prep and drape, including hand hygiene, mask, and sterile gloves were used. The patient was positioned and the spine was prepped. The skin was anesthetized with lidocaine. Free flow of clear CSF was obtained prior to injecting local anesthetic into the CSF. The spinal needle aspirated freely following injection. The needle was carefully withdrawn. The patient tolerated the procedure well.   Leslye Peer, MD

## 2020-09-05 NOTE — Transfer of Care (Signed)
Immediate Anesthesia Transfer of Care Note  Patient: Sharon Garner  Procedure(s) Performed: CESAREAN SECTION WITH BILATERAL TUBAL LIGATION (N/A )  Patient Location: PACU  Anesthesia Type:Spinal  Level of Consciousness: awake, alert  and oriented  Airway & Oxygen Therapy: Patient Spontanous Breathing  Post-op Assessment: Report given to RN and Post -op Vital signs reviewed and stable  Post vital signs: Reviewed and stable  Last Vitals:  Vitals Value Taken Time  BP 114/63 09/05/20 1024  Temp    Pulse 82 09/05/20 1027  Resp    SpO2 98 % 09/05/20 1027  Vitals shown include unvalidated device data.  Last Pain:  Vitals:   09/05/20 0652  TempSrc: Oral         Complications: No complications documented.

## 2020-09-05 NOTE — H&P (Addendum)
OBSTETRIC ADMISSION HISTORY AND PHYSICAL  Sharon Garner is a 30 y.o. female [phone] with IUP at [redacted]w[redacted]d by 7 week ultrasound presenting for scheduled repeat Cesarean. She reports +FMs, No LOF, no VB, no blurry vision, headaches or peripheral edema, and RUQ pain.  She plans on breast feeding. She requests BTL for birth control. She received her prenatal care at [facility]   Dating: By 7 week ultrasound --->  Estimated Date of Delivery: 09/12/20  Sono:  @[redacted]w[redacted]d , CWD, normal anatomy, cephalic presentation,  1982g, 41% EFW  Prenatal History/Complications:  - H/o Cesarean x2 - UTI with negative TOC - Short Interval Pregnancy  Past Medical History: Past Medical History:  Diagnosis Date  . Kidney stones    kidney stones removed 2021  . Migraine   . Ovarian cyst     Past Surgical History: Past Surgical History:  Procedure Laterality Date  . CESAREAN SECTION N/A 10/17/2012   Procedure: CESAREAN SECTION;  Surgeon: 10/19/2012, MD;  Location: [facility] ORS;  Service: Obstetrics;  Laterality: N/A;  . CESAREAN SECTION N/A 05/11/2019   Procedure: CESAREAN SECTION;  Surgeon: 05/13/2019, MD;  Location: [facility] LD ORS;  Service: Obstetrics;  Laterality: N/A;  . KIDNEY STONE SURGERY  2021    Obstetrical History: OB History    Gravida  6   Para  3   Term  3   Preterm      AB  2   Living  3     SAB  2   IAB      Ectopic      Multiple  0   Live Births  3           Social History Social History   Socioeconomic History  . Marital status: Married    Spouse name: Not on file  . Number of children: Not on file  . Years of education: Not on file  . Highest education level: Not on file  Occupational History  . Not on file  Tobacco Use  . Smoking status: Never Smoker  . Smokeless tobacco: Never Used  Vaping Use  . Vaping Use: Never used  Substance and Sexual Activity  . Alcohol use: No  . Drug use: No  . Sexual activity: Not Currently    Birth control/protection: None   Other Topics Concern  . Not on file  Social History Narrative  . Not on file   Social Determinants of Health   Financial Resource Strain: Not on file  Food Insecurity: Not on file  Transportation Needs: Not on file  Physical Activity: Not on file  Stress: Not on file  Social Connections: Not on file    Family History: Family History  Problem Relation Age of Onset  . Cancer Mother        reproductive  . Other Neg Hx   . Alcohol abuse Neg Hx   . Arthritis Neg Hx   . Asthma Neg Hx   . Birth defects Neg Hx   . COPD Neg Hx   . Depression Neg Hx   . Diabetes Neg Hx   . Drug abuse Neg Hx   . Early death Neg Hx   . Hearing loss Neg Hx   . Heart disease Neg Hx   . Hyperlipidemia Neg Hx   . Hypertension Neg Hx   . Kidney disease Neg Hx   . Learning disabilities Neg Hx   . Mental illness Neg Hx   . Mental retardation Neg Hx   .  Miscarriages / Stillbirths Neg Hx   . Stroke Neg Hx   . Vision loss Neg Hx     Allergies: No Known Allergies  Medications Prior to Admission  Medication Sig Dispense Refill Last Dose  . acetaminophen (TYLENOL) 325 MG tablet Take 650 mg by mouth every 6 (six) hours as needed for moderate pain.     . nitrofurantoin, macrocrystal-monohydrate, (MACROBID) 100 MG capsule Take 1 capsule (100 mg total) by mouth 2 (two) times daily. (Patient not taking: No sig reported) 14 capsule 0 Not Taking at Unknown time  . Prenatal Vit-Fe Fumarate-FA (PRENATAL VITAMINS) 28-0.8 MG TABS Take 1 tablet by mouth daily. 30 tablet 6 09/04/2020 at Unknown time  . zolpidem (AMBIEN) 5 MG tablet Take 1 tablet (5 mg total) by mouth at bedtime as needed for sleep. 30 tablet 1 09/04/2020 at Unknown time  . Elastic Bandages & Supports (COMFORT FIT MATERNITY SUPP LG) MISC Wear daily when ambulating 1 each 0   . Misc. Devices MISC Dispense one maternity belt for patient 1 each 0      Review of Systems   All systems reviewed and negative except as stated in HPI  Blood pressure  131/88, pulse 73, temperature 98.8 F (37.1 C), temperature source Oral, resp. rate 18, height 5\' 1"  (1.549 m), weight 83 kg, last menstrual period 11/10/2019, SpO2 99 %, currently breastfeeding. General appearance: alert, cooperative and appears stated age Lungs: normal WOB Heart: regular rate  Abdomen: soft, non-tender Extremities: no sign of DVT Presentation: cephalic Fetal monitoring: FHT 130 on Doppler  Prenatal labs: ABO, Rh: --/--/PENDING (05/16 0700) Antibody: PENDING (05/16 0700) Rubella: 1.54 (10/07 1445) RPR: Non Reactive (02/23 1111)  HBsAg: Negative (10/07 1445)  HIV: Non Reactive (02/23 1111)  GBS: Negative/-- (05/02 0420)  2 hr Glucola wnl Genetic screening  wnl Anatomy 08-13-1999 wnl except for echogenic intracardiac focus  Prenatal Transfer Tool  Maternal Diabetes: No Genetic Screening: Normal Maternal Ultrasounds/Referrals: Normal except for echogenic intracardiac focus Fetal Ultrasounds or other Referrals:  None Maternal Substance Abuse:  No Significant Maternal Medications:  None Significant Maternal Lab Results: Group B Strep negative  Results for orders placed or performed during the hospital encounter of 09/05/20 (from the past 24 hour(s))  CBC   Collection Time: 09/05/20  6:42 AM  Result Value Ref Range   WBC 8.8 4.0 - 10.5 K/uL   RBC 4.48 3.87 - 5.11 MIL/uL   Hemoglobin 12.1 12.0 - 15.0 g/dL   HCT 09/07/20 77.8 - 24.2 %   MCV 82.4 80.0 - 100.0 fL   MCH 27.0 26.0 - 34.0 pg   MCHC 32.8 30.0 - 36.0 g/dL   RDW 35.3 (H) 61.4 - 43.1 %   Platelets 249 150 - 400 K/uL   nRBC 0.0 0.0 - 0.2 %  Type and screen   Collection Time: 09/05/20  7:00 AM  Result Value Ref Range   ABO/RH(D) PENDING    Antibody Screen PENDING    Sample Expiration      09/08/2020,2359 Performed at [facility], [address] [place], [place]     Patient Active Problem List   Diagnosis Date Noted  . History of 2 cesarean sections 09/05/2020  . [redacted] weeks gestation of  pregnancy 08/11/2020  . UTI (urinary tract infection) during pregnancy 07/26/2020  . Unwanted fertility 06/01/2020  . Round ligament pain 05/04/2020  . Back pain in pregnancy 05/04/2020  . Obesity in pregnancy 05/04/2020  . Supervision of other normal pregnancy,  antepartum 01/28/2020  . Short interval between pregnancies affecting pregnancy in third trimester, antepartum 01/28/2020  . History of C-section 05/11/2019  . Family history of intellectual disability 05/16/2012  . Migraine headache without aura 11/09/2011    Assessment/Plan:  Lounell Schumacher is a 30 y.o. [id number] at [redacted]w[redacted]d here for scheduled repeat Cesarean with BTL.  #Scheduled Repeat Cesarean with BTL: The risks of cesarean section were discussed with the patient including but were not limited to: bleeding which may require transfusion or reoperation; infection which may require antibiotics; injury to bowel, bladder, ureters or other surrounding organs; injury to the fetus; need for additional procedures including hysterectomy in the event of a life-threatening hemorrhage; placental abnormalities wth subsequent pregnancies, incisional problems, thromboembolic phenomenon and other postoperative/anesthesia complications.  Patient also desires permanent sterilization.  Other reversible forms of contraception were discussed with patient; she declines all other modalities. Risks of procedure discussed with patient including but not limited to: risk of regret, permanence of method, bleeding, infection, injury to surrounding organs and need for additional procedures.  Failure risk of about 1% with increased risk of ectopic gestation if pregnancy occurs was also discussed with patient.  Also discussed possibility of post-tubal pain syndrome. The patient concurred with the proposed plan, giving informed written consent for the procedures.  Patient has been NPO since yesterday; she will remain NPO for procedure. Anesthesia and OR aware.   Preoperative prophylactic antibiotics and SCDs ordered on call to the OR.  To OR when ready.  #Pain: Per anesthesia #FWB: FHT 135 on Doppler #ID: GBS negative; ancef prior to OR #MOF: breast #MOC: desires BTL as noted above. Consent form signed 07/14/20. #Circ:  Declines  Doreatha Offer, Skipper Cliche, MD OB Fellow, Faculty Practice 09/05/2020 7:49 AM

## 2020-09-05 NOTE — Anesthesia Preprocedure Evaluation (Addendum)
Anesthesia Evaluation  Patient identified by MRN, date of birth, ID band Patient awake    Reviewed: Allergy & Precautions, NPO status , Patient's Chart, lab work & pertinent test results  History of Anesthesia Complications Negative for: history of anesthetic complications  Airway Mallampati: II   Neck ROM: Full    Dental   Pulmonary neg pulmonary ROS,    Pulmonary exam normal        Cardiovascular negative cardio ROS Normal cardiovascular exam     Neuro/Psych  Headaches, negative psych ROS   GI/Hepatic negative GI ROS, Neg liver ROS,   Endo/Other   Obesity   Renal/GU negative Renal ROS     Musculoskeletal negative musculoskeletal ROS (+)   Abdominal   Peds  Hematology negative hematology ROS (+)  Plt 249k    Anesthesia Other Findings Covid test negative   Reproductive/Obstetrics (+) Pregnancy                            Anesthesia Physical Anesthesia Plan  ASA: II  Anesthesia Plan: Spinal   Post-op Pain Management:    Induction:   PONV Risk Score and Plan: 2 and Treatment may vary due to age or medical condition, Ondansetron and Scopolamine patch - Pre-op  Airway Management Planned: Natural Airway  Additional Equipment: None  Intra-op Plan:   Post-operative Plan:   Informed Consent: I have reviewed the patients History and Physical, chart, labs and discussed the procedure including the risks, benefits and alternatives for the proposed anesthesia with the patient or authorized representative who has indicated his/her understanding and acceptance.       Plan Discussed with: CRNA and Anesthesiologist  Anesthesia Plan Comments: (Labs reviewed, platelets acceptable. Discussed risks and benefits of spinal, including spinal/epidural hematoma, infection, failed block, and PDPH. Patient expressed understanding and wished to proceed. )       Anesthesia Quick  Evaluation

## 2020-09-05 NOTE — Progress Notes (Signed)
Spoke directly to Dr. Donavan Foil to report that patient is feeling "strong contractions" and wants to speak to a doctor.

## 2020-09-05 NOTE — Anesthesia Postprocedure Evaluation (Signed)
Anesthesia Post Note  Patient: Higher education careers adviser  Procedure(s) Performed: CESAREAN SECTION WITH BILATERAL TUBAL LIGATION (N/A )     Patient location during evaluation: PACU Anesthesia Type: Spinal Level of consciousness: oriented and awake and alert Pain management: pain level controlled Vital Signs Assessment: post-procedure vital signs reviewed and stable Respiratory status: spontaneous breathing, respiratory function stable and patient connected to nasal cannula oxygen Cardiovascular status: blood pressure returned to baseline and stable Postop Assessment: no headache, no backache and no apparent nausea or vomiting Anesthetic complications: no   No complications documented.  Last Vitals:  Vitals:   09/05/20 1230 09/05/20 1338  BP: 106/75 116/80  Pulse: 65 67  Resp: 16 16  Temp: (!) 36.3 C 36.4 C  SpO2: 98% 98%    Last Pain:  Vitals:   09/05/20 1338  TempSrc: Oral  PainSc: 0-No pain   Pain Goal:                Epidural/Spinal Function Cutaneous sensation: Able to Discern Pressure (09/05/20 1338), Patient able to flex knees: Yes (09/05/20 1338), Patient able to lift hips off bed: No (09/05/20 1338), Back pain beyond tenderness at insertion site: No (09/05/20 1338), Progressively worsening motor and/or sensory loss: No (09/05/20 1338), Bowel and/or bladder incontinence post epidural: No (09/05/20 1338)  Beryle Lathe

## 2020-09-05 NOTE — Lactation Note (Signed)
This note was copied from a baby's chart. Lactation Consultation Note  Patient Name: Sharon Garner [id number] Date: 09/05/2020 Reason for consult: Initial assessment;NICU baby;Term Age:30 hours  Initial visit to P4 mother of 8 hours old term currently in NICU. Mother states she breastfed her first and second children for 1 month and last one for 4 months. Mother reports the intention to breastfeed infant. LC set up DEBP. Mother reports using a hand pump before. Mother collected ~25mL of EBM once initiation mode ended.   Reinforced the importance of pumping as well as basics/cleaning/frequency. Talked about breast massage, hand expression and drops easily expressed. Mother is proficient.   Reviewed Breastfeeding a NICU baby booklet and lactation services information with mother and encouraged to contact [facility] for support and questions.  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 Offered to submit a referral on baby's behalf for a Hospital grade pump due to NICU admission.    Maternal Data Has patient been taught Hand Expression?: Yes Does the patient have breastfeeding experience prior to this delivery?: Yes How long did the patient breastfeed?: Up to 4 months x2  Feeding Mother's Current Feeding Choice: Breast Milk  Lactation Tools Discussed/Used Tools: Flanges;Pump Flange Size: 24 Breast pump type: Double-Electric Breast Pump Pump Education: Setup, frequency, and cleaning;Milk Storage Reason for Pumping: NICU baby Pumping frequency: Q3 Pumped volume: 5 mL  Interventions Interventions: Breast feeding basics reviewed;Hand express;Breast massage;DEBP;Hand pump;Expressed milk;Education  Discharge Pump: DEBP WIC Program: Yes  Consult Status Consult Status: Follow-up Date: 09/06/20 Follow-up type: In-patient    Sharon Garner 09/05/2020, 6:06 PM

## 2020-09-05 NOTE — Discharge Summary (Signed)
Postpartum Discharge Summary    Patient Name: Sharon Garner DOB: 1990/11/19 MRN: [id number]  Date of admission: 09/05/2020 Delivery date:09/05/2020  Delivering provider: Lynnda Shields A  Date of discharge: 09/07/2020  Admitting diagnosis: History of 2 cesarean sections [Z98.891] Intrauterine pregnancy: [redacted]w[redacted]d    Secondary diagnosis:  Principal Problem:   Cesarean delivery delivered Active Problems:   Short interval between pregnancies affecting pregnancy in third trimester, antepartum   UTI (urinary tract infection) during pregnancy   History of 2 cesarean sections  Additional problems: as noted above Discharge diagnosis: Cesarean delivery delivered                                            Post partum procedures:postpartum tubal ligation Augmentation: N/A Complications: None  Hospital course: Sceduled C/S   30y.o. yo [id number] admitted to the hospital 09/05/2020 for scheduled cesarean section with the following indication:history of Cesarean x2.Delivery details are as follows:  Membrane Rupture Time/Date: 9:37 AM ,09/05/2020   Delivery Method:C-Section, Vacuum Assisted  Details of operation can be found in separate operative note.  Patient had an uncomplicated postpartum course.  She is ambulating, tolerating a regular diet, passing flatus, and urinating well. Patient is discharged home in stable condition on  09/07/20        Newborn Data: Birth date:09/05/2020  Birth time:9:38 AM  Gender:Female  Living status:Living  Apgars:9 ,9  Weight:3260 g     Magnesium Sulfate received: No BMZ received: No Rhophylac:N/A MMR:N/A T-DaP:offered prior discharge Flu: offered prior discharge Transfusion:No  Physical exam  Vitals:   09/06/20 0544 09/06/20 1841 09/07/20 0041 09/07/20 0618  BP: 114/73 122/80 128/82 132/81  Pulse: 62 74 82 74  Resp:  16 16 16   Temp:  98.3 F (36.8 C) 98.2 F (36.8 C) 98 F (36.7 C)  TempSrc:  Oral Oral Oral  SpO2: 100% 100% 98%  99%  Weight:      Height:       General: alert, cooperative and no distress Lochia: appropriate Uterine Fundus: firm Incision: N/A DVT Evaluation: No evidence of DVT seen on physical exam. No cords or calf tenderness. No significant calf/ankle edema. Labs: Lab Results  Component Value Date   WBC 15.8 (H) 09/06/2020   HGB 10.1 (L) 09/06/2020   HCT 31.0 (L) 09/06/2020   MCV 83.3 09/06/2020   PLT 219 09/06/2020   CMP Latest Ref Rng & Units 07/22/2020  Glucose 70 - 99 mg/dL 88  BUN 6 - 20 mg/dL <5(L)  Creatinine 0.44 - 1.00 mg/dL 0.58  Sodium 135 - 145 mmol/L 134(L)  Potassium 3.5 - 5.1 mmol/L 3.4(L)  Chloride 98 - 111 mmol/L 107  CO2 22 - 32 mmol/L 20(L)  Calcium 8.9 - 10.3 mg/dL 8.4(L)  Total Protein 6.5 - 8.1 g/dL -  Total Bilirubin 0.3 - 1.2 mg/dL -  Alkaline Phos 38 - 126 U/L -  AST 15 - 41 U/L -  ALT 0 - 44 U/L -   Edinburgh Score: Edinburgh Postnatal Depression Scale Screening Tool 09/07/2020  I have been able to laugh and see the funny side of things. 0  I have looked forward with enjoyment to things. 0  I have blamed myself unnecessarily when things went wrong. 0  I have been anxious or worried for no good reason. 0  I have felt scared or panicky for no  good reason. 0  Things have been getting on top of me. 0  I have been so unhappy that I have had difficulty sleeping. 0  I have felt sad or miserable. 0  I have been so unhappy that I have been crying. 0  The thought of harming myself has occurred to me. 0  Edinburgh Postnatal Depression Scale Total 0     After visit meds:  Allergies as of 09/07/2020   No Known Allergies     Medication List    STOP taking these medications   [place]. Devices Misc   nitrofurantoin (macrocrystal-monohydrate) 100 MG capsule Commonly known as: MACROBID   zolpidem 5 MG tablet Commonly known as: AMBIEN     TAKE these medications   acetaminophen 325 MG tablet Commonly known as:  TYLENOL Take 650 mg by mouth every 6 (six) hours as needed for moderate pain.   coconut oil Oil Apply 1 application topically as needed (nipple pain).   ferrous sulfate 325 (65 FE) MG tablet Take 1 tablet (325 mg total) by mouth every other day.   ibuprofen 600 MG tablet Commonly known as: ADVIL Take 1 tablet (600 mg total) by mouth every 6 (six) hours.   oxyCODONE 5 MG immediate release tablet Commonly known as: Oxy IR/ROXICODONE Take 1 tablet (5 mg total) by mouth every 6 (six) hours as needed for severe pain or breakthrough pain.   Prenatal Vitamins 28-0.8 MG Tabs Take 1 tablet by mouth daily.       Discharge home in stable condition Infant Feeding: Breast Infant Disposition:home with mother Discharge instruction: per After Visit Summary and Postpartum booklet. Activity: Advance as tolerated. Pelvic rest for 6 weeks.  Diet: routine diet Future Appointments: Future Appointments  Date Time Provider [place]  09/12/2020  1:40 PM [place] None  10/17/2020  2:10 PM Burleson, Rona Ravens, NP [facility] None   Follow up Visit: Message sent to [facility] by Dr. Astrid Drafts  Please schedule this patient for a In person postpartum visit in 6 weeks with the following provider: Any provider. Additional Postpartum F/U:Incision check 1 week, BP check  High risk pregnancy complicated by: now h/o Cesarean x3 Delivery mode:  C-Section, Vacuum Assisted  Anticipated Birth Control:  BTL done [facility]  09/07/2020 Randa Ngo, MD

## 2020-09-06 ENCOUNTER — Encounter: Payer: Medicaid Other | Admitting: Obstetrics

## 2020-09-06 LAB — CBC
HCT: 31 % — ABNORMAL LOW (ref 36.0–46.0)
Hemoglobin: 10.1 g/dL — ABNORMAL LOW (ref 12.0–15.0)
MCH: 27.2 pg (ref 26.0–34.0)
MCHC: 32.6 g/dL (ref 30.0–36.0)
MCV: 83.3 fL (ref 80.0–100.0)
Platelets: 219 10*3/uL (ref 150–400)
RBC: 3.72 MIL/uL — ABNORMAL LOW (ref 3.87–5.11)
RDW: 16.5 % — ABNORMAL HIGH (ref 11.5–15.5)
WBC: 15.8 10*3/uL — ABNORMAL HIGH (ref 4.0–10.5)
nRBC: 0 % (ref 0.0–0.2)

## 2020-09-06 LAB — SURGICAL PATHOLOGY

## 2020-09-06 LAB — BIRTH TISSUE RECOVERY COLLECTION (PLACENTA DONATION)

## 2020-09-06 MED ORDER — FERROUS SULFATE 325 (65 FE) MG PO TABS: 325.0000 mg | ORAL_TABLET | ORAL | Status: DC

## 2020-09-06 NOTE — Progress Notes (Signed)
Patient screened out for psychosocial assessment since none of the following apply:  Psychosocial stressors documented in mother or baby's chart  Gestation less than 32 weeks  Code at delivery   Infant with anomalies Please contact the Clinical Social Worker if specific needs arise, by MOB's request, or if MOB scores greater than 9/yes to question 10 on Edinburgh Postpartum Depression Screen.  Clarence Dunsmore, LCSW Clinical Social Worker [facility] Cell#: [phone]     

## 2020-09-06 NOTE — Progress Notes (Signed)
Post-Op Day 1, repeat CS for elective  Subjective: No complaints, up ad lib, voiding and tolerating PO, passing flatus,small lochia,.plans to breastfeed, bilateral tubal ligation  Objective: Blood pressure 114/73, pulse 62, temperature 98.5 F (36.9 C), temperature source Oral, resp. rate 16, height 5\' 1"  (1.549 m), weight 83 kg, last menstrual period 11/10/2019, SpO2 100 %, currently breastfeeding.  Physical Exam:  General: alert, cooperative and no distress Lochia:normal flow Chest: CTAB Heart: RRR no m/r/g Abdomen: +BS, soft, nontender, dsg c/d/intact, no erythema Uterine Fundus: firm DVT Evaluation: No evidence of DVT seen on physical exam. Extremities: no edema  Recent Labs    09/05/20 0642 09/06/20 0539  HGB 12.1 10.1*  HCT 36.9 31.0*    Assessment/Plan: POD #1, eRLTCS Baby in NICU (TTN?)   LOS: 1 day   09/08/20 09/06/2020, 7:45 AM

## 2020-09-06 NOTE — Lactation Note (Signed)
This note was copied from a baby's chart. Lactation Consultation Note  Patient Name: Sharon Garner Date: 09/06/2020   Age:30 hours  Mom was seen in her postpartum room. Mom has been pumping per [facility] recommendation, but has only been getting drops. Hand expression was taught to Mom and we obtained almost 5 mL. While helping her to express, I noted that she had colostrum coming from the still-patent areas of the nipple rings she had 5 yrs ago.   Mom reports having lots of milk with her last child (now 58 yo). She said that infant would sometimes choke at the breast b/c of the flow & Mom recalls having to change her nursing pads q2-3hrs b/c of leakage. Mom nursed that child for almost 4 months, but had to quit b/c Mom said  medication she had to take for kidney stones was not compatible with breastfeeding.   Mom is comfortable with her size 24 flanges. I recommended that Mom add hand expression after using her DEBP.   Lurline Hare [facility] 09/06/2020, 12:08 PM

## 2020-09-07 MED ORDER — COCONUT OIL OIL: 1.0000 "application " | TOPICAL_OIL | 0 refills | Status: DC | PRN

## 2020-09-07 MED ORDER — FERROUS SULFATE 325 (65 FE) MG PO TABS: 325.0000 mg | ORAL_TABLET | ORAL | 2 refills | Status: AC

## 2020-09-07 MED ORDER — OXYCODONE HCL 5 MG PO TABS: 5.0000 mg | ORAL_TABLET | Freq: Four times a day (QID) | ORAL | 0 refills | Status: DC | PRN

## 2020-09-07 MED ORDER — IBUPROFEN 600 MG PO TABS: 600.0000 mg | ORAL_TABLET | Freq: Four times a day (QID) | ORAL | 0 refills | Status: DC

## 2020-09-07 NOTE — Progress Notes (Signed)
Subjective: Postpartum Day 2: Cesarean Delivery & BTL Eating, drinking, voiding, ambulating well.  +flatus.  Lochia and pain wnl.  Denies dizziness, lightheadedness, or sob. No complaints.    Objective: Vital signs in last 24 hours: Temp:  [98 F (36.7 C)-98.3 F (36.8 C)] 98 F (36.7 C) (05/18 0618) Pulse Rate:  [74-82] 74 (05/18 0618) Resp:  [16] 16 (05/18 0618) BP: (122-132)/(80-82) 132/81 (05/18 0618) SpO2:  [98 %-100 %] 99 % (05/18 0618)  Physical Exam:  General: alert, cooperative and no distress Lochia: appropriate Uterine Fundus: firm Incision: healing well, no significant drainage (small amt Rt side), no dehiscence, no significant erythema DVT Evaluation: No evidence of DVT seen on physical exam. Negative Homan's sign. No cords or calf tenderness. No significant calf/ankle edema.  Recent Labs    09/05/20 0642 09/06/20 0539  HGB 12.1 10.1*  HCT 36.9 31.0*    Assessment/Plan: Status post Cesarean section. Doing well postoperatively.  Continue current care. Baby in NICU d/t resp issues, plan d/c tomorrow On po Fe   Cheral Marker 09/07/2020, 7:24 AM

## 2020-09-07 NOTE — Lactation Note (Signed)
This note was copied from a baby's chart. Lactation Consultation Note  Patient Name: Sharon Garner WRUEA'V Date: 09/07/2020 Reason for consult: Follow-up assessment;Term;NICU baby Age:30 hours  Follow up visit to 48 hours old infant with 1.84% weight loss at the time of visit. Baby is still in NICU. Mother states she started breastfeeding yesterday and it is going well. Mother reports a good comfortable latch. Infant is getting formula supplementation too. Mother is collecting ~82mL when pumping. Discussed feeding volume according to age.   Feeding plan:  1-Skin to skin 2-Aim for a deep, comfortable latch 3-Breastfeeding on demand or 8-12 times in 24h period. 4-Keep infant awake during breastfeeding session: massaging breast, infant's hand/shoulder/feet 5-Pump or hand-express and offer EBM prior to supplementation. 6-If needed, supplement following guidelines, paced bottle feeding and fullness cues.  7-Monitor voids and stools as signs good intake.  8-Encouraged maternal rest, hydration and food intake.  9-Contact LC as needed for feeds/support/concerns/questions  All questions answered at this time. Family is waiting to be discharged home today.  Feeding Mother's Current Feeding Choice: Breast Milk Nipple Type: Nfant Slow Flow (purple)  Lactation Tools Discussed/Used Tools: Pump Flange Size: 24 Breast pump type: Double-Electric Breast Pump Reason for Pumping: NICU baby Pumping frequency: Q3 Pumped volume: 60 mL  Interventions Interventions: Breast feeding basics reviewed;Skin to skin;Breast massage;Hand express;DEBP;Hand pump;Expressed milk;Education  Discharge Discharge Education: Engorgement and breast care;Warning signs for feeding baby Pump: Personal WIC Program: Yes  Consult Status Consult Status: Complete Date: 09/07/20 Follow-up type: Call as needed    Pranay Hilbun A Higuera Ancidey 09/07/2020, 10:10 AM

## 2020-09-07 NOTE — Progress Notes (Signed)
Honeycomb dressing changed per order. Incision edges approximated and no signs or symptoms of infection at this time.  Dressing changed without difficulty, incision care reviewed with patient, acknowledged instructions.

## 2020-09-08 ENCOUNTER — Telehealth: Payer: Self-pay | Admitting: *Deleted

## 2020-09-08 NOTE — Telephone Encounter (Addendum)
Transition Care Management Unsuccessful Follow-up Telephone Call  Date of discharge and from where:  09/07/2020 - [facility]  Attempts:  1st Attempt  Reason for unsuccessful TCM follow-up call:  Unable to leave message

## 2020-09-09 NOTE — Telephone Encounter (Signed)
Transition Care Management Follow-up Telephone Call  Date of discharge and from where: 09/07/2020 from [facility]  How have you been since you were released from the hospital? Pt states that she is in pain and is not taking pain medication because she is breast feeding. I have suggested to patient that she reach out to the OBGYN for recommendation on what she can do to help control the pain better.   Any questions or concerns? No  Items Reviewed:  Did the pt receive and understand the discharge instructions provided? Yes   Medications obtained and verified? Yes   Other? No   Any new allergies since your discharge? No   Dietary orders reviewed? n/a  Do you have support at home? Yes   Functional Questionnaire: (I = Independent and D = Dependent) ADLs: I - There is some dependence with ADL's due to the Caesarean Section and Tuba ligation. She is having difficulty changing positions but is able to "take it slow" in order to perform her ADL's.   Bathing/Dressing- I  Meal Prep- I  Eating- I  Maintaining continence- I  Transferring/Ambulation- I  Managing Meds- I   Follow up appointments reviewed:   Special[facility] Hospital f/u appt confirmed? Yes  Scheduled to see [facility] Nurse Visit on 09/12/2020 @ 1:40pm.  Are transportation arrangements needed? No   If their condition worsens, is the pt aware to call PCP or go to the Emergency Dept.? Yes  Was the patient provided with contact information for the PCP's office or ED? Yes  Was to pt encouraged to call back with questions or concerns? Yes

## 2020-09-12 ENCOUNTER — Other Ambulatory Visit: Payer: Self-pay

## 2020-09-12 ENCOUNTER — Ambulatory Visit (INDEPENDENT_AMBULATORY_CARE_PROVIDER_SITE_OTHER): Payer: Medicaid Other

## 2020-09-12 VITALS — BP 138/72 | HR 82

## 2020-09-12 DIAGNOSIS — Z5189 Encounter for other specified aftercare: Secondary | ICD-10-CM

## 2020-09-12 NOTE — Progress Notes (Signed)
Subjective:     Sharon Garner is a 30 y.o. female who presents to the clinic one week status post c-section for a wound check. Eating a regular diet with-out difficulty. Bowel movements are normal. Pain is control   Review of Systems   Objective:    BP 138/72   Pulse 82  General:  Well appearance  Abdomen: post op abd exam:soft bowel sounds active,non-tender  Incision:   incision:no dehiscence,incision well approximated,healing well,no drainage,no erythema","no hernia","no seroma","no swelling     Assessment:   Doing Well    Plan:    1. Continue any current medications. 2. Wound care discussed. 3. Activity restrictions: As tolerated 4. Anticipated return to work: 1-2 months 5. Follow up:  10/17/2020  Isaiah Torok Emeline Darling, CMA

## 2020-09-13 ENCOUNTER — Encounter: Payer: Self-pay | Admitting: *Deleted

## 2020-09-26 ENCOUNTER — Ambulatory Visit (INDEPENDENT_AMBULATORY_CARE_PROVIDER_SITE_OTHER): Payer: Medicaid Other

## 2020-09-26 ENCOUNTER — Other Ambulatory Visit: Payer: Self-pay

## 2020-09-26 VITALS — BP 114/72 | HR 67 | Temp 98.2°F

## 2020-09-26 DIAGNOSIS — Z4889 Encounter for other specified surgical aftercare: Secondary | ICD-10-CM

## 2020-09-26 NOTE — Progress Notes (Signed)
Patient presents for wound check 3 weeks postpartum due to having a yellowish/greenish drainage with a slight odor. Sx started about a week ago. Patient reports mild pain.   There is some slight separations in incision. No drainage noted at this time. No odor noticed. Incision evaluated by provider. No fever today. Patient denies fever, chills, and body aches.  The wound is cleansed, debrided of foreign material as much as possible, and dressed. The patient is alerted to watch for any signs of infection (redness, pus, pain, increased swelling or fever) and call if such occurs. Home wound care instructions are provided. Tetanus vaccination status reviewed: last tetanus booster within 10 years.

## 2020-09-27 NOTE — Progress Notes (Signed)
Patient seen and assessed by nursing staff.  Agree with documentation and plan.  

## 2020-10-17 ENCOUNTER — Ambulatory Visit: Payer: Medicaid Other | Admitting: Nurse Practitioner

## 2020-10-20 ENCOUNTER — Ambulatory Visit: Payer: Medicaid Other | Admitting: Obstetrics

## 2020-10-26 ENCOUNTER — Ambulatory Visit: Payer: Medicaid Other | Admitting: Obstetrics

## 2020-11-07 ENCOUNTER — Ambulatory Visit: Payer: Medicaid Other | Admitting: Obstetrics

## 2021-01-02 DIAGNOSIS — Z30011 Encounter for initial prescription of contraceptive pills: Secondary | ICD-10-CM | POA: Diagnosis not present

## 2021-08-18 ENCOUNTER — Emergency Department (HOSPITAL_COMMUNITY)
Admission: EM | Admit: 2021-08-18 | Discharge: 2021-08-19 | Discharge status: Against Medical Advice | Payer: Medicaid Other | Attending: Emergency Medicine | Admitting: Emergency Medicine

## 2021-08-18 ENCOUNTER — Emergency Department (HOSPITAL_COMMUNITY): Payer: Medicaid Other

## 2021-08-18 ENCOUNTER — Other Ambulatory Visit: Payer: Self-pay

## 2021-08-18 ENCOUNTER — Encounter (HOSPITAL_COMMUNITY): Payer: Self-pay | Admitting: Emergency Medicine

## 2021-08-18 DIAGNOSIS — R202 Paresthesia of skin: Secondary | ICD-10-CM | POA: Diagnosis not present

## 2021-08-18 DIAGNOSIS — Z5321 Procedure and treatment not carried out due to patient leaving prior to being seen by health care provider: Secondary | ICD-10-CM | POA: Insufficient documentation

## 2021-08-18 DIAGNOSIS — R531 Weakness: Secondary | ICD-10-CM | POA: Insufficient documentation

## 2021-08-18 DIAGNOSIS — R519 Headache, unspecified: Secondary | ICD-10-CM | POA: Diagnosis not present

## 2021-08-18 DIAGNOSIS — R200 Anesthesia of skin: Secondary | ICD-10-CM | POA: Diagnosis not present

## 2021-08-18 DIAGNOSIS — R079 Chest pain, unspecified: Secondary | ICD-10-CM | POA: Diagnosis not present

## 2021-08-18 DIAGNOSIS — R0789 Other chest pain: Secondary | ICD-10-CM | POA: Insufficient documentation

## 2021-08-18 DIAGNOSIS — M79602 Pain in left arm: Secondary | ICD-10-CM | POA: Insufficient documentation

## 2021-08-18 DIAGNOSIS — G43009 Migraine without aura, not intractable, without status migrainosus: Secondary | ICD-10-CM | POA: Insufficient documentation

## 2021-08-18 DIAGNOSIS — R42 Dizziness and giddiness: Secondary | ICD-10-CM | POA: Diagnosis not present

## 2021-08-18 DIAGNOSIS — R0602 Shortness of breath: Secondary | ICD-10-CM | POA: Insufficient documentation

## 2021-08-18 DIAGNOSIS — R110 Nausea: Secondary | ICD-10-CM | POA: Diagnosis not present

## 2021-08-18 LAB — BASIC METABOLIC PANEL
Anion gap: 8 (ref 5–15)
BUN: 10 mg/dL (ref 6–20)
CO2: 24 mmol/L (ref 22–32)
Calcium: 8.7 mg/dL — ABNORMAL LOW (ref 8.9–10.3)
Chloride: 107 mmol/L (ref 98–111)
Creatinine, Ser: 0.73 mg/dL (ref 0.44–1.00)
GFR, Estimated: >60 mL/min (ref >60)
Glucose, Bld: 101 mg/dL — ABNORMAL HIGH (ref 70–99)
Potassium: 4 mmol/L (ref 3.5–5.1)
Sodium: 139 mmol/L (ref 135–145)

## 2021-08-18 LAB — CBC
HCT: 39.9 % (ref 36.0–46.0)
Hemoglobin: 13.3 g/dL (ref 12.0–15.0)
MCH: 29.7 pg (ref 26.0–34.0)
MCHC: 33.3 g/dL (ref 30.0–36.0)
MCV: 89.1 fL (ref 80.0–100.0)
Platelets: 383 10*3/uL (ref 150–400)
RBC: 4.48 MIL/uL (ref 3.87–5.11)
RDW: 13.1 % (ref 11.5–15.5)
WBC: 7.9 10*3/uL (ref 4.0–10.5)
nRBC: 0 % (ref 0.0–0.2)

## 2021-08-18 LAB — TROPONIN I (HIGH SENSITIVITY): Troponin I (High Sensitivity): 4 ng/L (ref <18)

## 2021-08-18 NOTE — ED Triage Notes (Signed)
Pt reported to ED with c/o right sided chest pain and tingling in right hand since yesterday. Pt also endorses tingling and headache to right side of head, nausea, lightheadedness, and shortness of breath. Pt has very rapid speech at time of triage but denies any hx of anxiety or new stressors. Stroke screen negative.  ?

## 2021-08-19 ENCOUNTER — Encounter (HOSPITAL_COMMUNITY): Payer: Self-pay | Admitting: *Deleted

## 2021-08-19 ENCOUNTER — Emergency Department (HOSPITAL_COMMUNITY)
Admission: EM | Admit: 2021-08-19 | Discharge: 2021-08-19 | Disposition: A | Discharge status: Home / Self Care | Payer: Medicaid Other | Source: Home / Self Care | Attending: Emergency Medicine | Admitting: Emergency Medicine

## 2021-08-19 ENCOUNTER — Emergency Department (HOSPITAL_COMMUNITY): Payer: Medicaid Other

## 2021-08-19 DIAGNOSIS — R531 Weakness: Secondary | ICD-10-CM | POA: Insufficient documentation

## 2021-08-19 DIAGNOSIS — R202 Paresthesia of skin: Secondary | ICD-10-CM | POA: Diagnosis not present

## 2021-08-19 DIAGNOSIS — R200 Anesthesia of skin: Secondary | ICD-10-CM | POA: Diagnosis not present

## 2021-08-19 DIAGNOSIS — R519 Headache, unspecified: Secondary | ICD-10-CM | POA: Diagnosis not present

## 2021-08-19 DIAGNOSIS — R0789 Other chest pain: Secondary | ICD-10-CM | POA: Insufficient documentation

## 2021-08-19 DIAGNOSIS — G43009 Migraine without aura, not intractable, without status migrainosus: Secondary | ICD-10-CM | POA: Insufficient documentation

## 2021-08-19 DIAGNOSIS — M79602 Pain in left arm: Secondary | ICD-10-CM | POA: Insufficient documentation

## 2021-08-19 LAB — COMPREHENSIVE METABOLIC PANEL
ALT: 28 U/L (ref 0–44)
AST: 26 U/L (ref 15–41)
Albumin: 4.2 g/dL (ref 3.5–5.0)
Alkaline Phosphatase: 57 U/L (ref 38–126)
Anion gap: 4 — ABNORMAL LOW (ref 5–15)
BUN: 13 mg/dL (ref 6–20)
CO2: 25 mmol/L (ref 22–32)
Calcium: 8.6 mg/dL — ABNORMAL LOW (ref 8.9–10.3)
Chloride: 109 mmol/L (ref 98–111)
Creatinine, Ser: 0.63 mg/dL (ref 0.44–1.00)
GFR, Estimated: >60 mL/min (ref >60)
Glucose, Bld: 87 mg/dL (ref 70–99)
Potassium: 3.8 mmol/L (ref 3.5–5.1)
Sodium: 138 mmol/L (ref 135–145)
Total Bilirubin: 0.5 mg/dL (ref 0.3–1.2)
Total Protein: 8 g/dL (ref 6.5–8.1)

## 2021-08-19 LAB — CBC WITH DIFFERENTIAL/PLATELET
Abs Immature Granulocytes: 0.02 10*3/uL (ref 0.00–0.07)
Basophils Absolute: 0 10*3/uL (ref 0.0–0.1)
Basophils Relative: 1 %
Eosinophils Absolute: 0.2 10*3/uL (ref 0.0–0.5)
Eosinophils Relative: 2 %
HCT: 40.8 % (ref 36.0–46.0)
Hemoglobin: 13.5 g/dL (ref 12.0–15.0)
Immature Granulocytes: 0 %
Lymphocytes Relative: 32 %
Lymphs Abs: 2.3 10*3/uL (ref 0.7–4.0)
MCH: 29.5 pg (ref 26.0–34.0)
MCHC: 33.1 g/dL (ref 30.0–36.0)
MCV: 89.1 fL (ref 80.0–100.0)
Monocytes Absolute: 0.5 10*3/uL (ref 0.1–1.0)
Monocytes Relative: 7 %
Neutro Abs: 4.2 10*3/uL (ref 1.7–7.7)
Neutrophils Relative %: 58 %
Platelets: 372 10*3/uL (ref 150–400)
RBC: 4.58 MIL/uL (ref 3.87–5.11)
RDW: 13.2 % (ref 11.5–15.5)
WBC: 7.3 10*3/uL (ref 4.0–10.5)
nRBC: 0 % (ref 0.0–0.2)

## 2021-08-19 LAB — I-STAT BETA HCG BLOOD, ED (MC, WL, AP ONLY)
I-stat hCG, quantitative: <5 m[IU]/mL (ref <5)
I-stat hCG, quantitative: <5 m[IU]/mL (ref <5)

## 2021-08-19 LAB — TROPONIN I (HIGH SENSITIVITY): Troponin I (High Sensitivity): <2 ng/L (ref <18)

## 2021-08-19 MED ORDER — KETOROLAC TROMETHAMINE 15 MG/ML IJ SOLN
15.0000 mg | Freq: Once | INTRAMUSCULAR | Status: AC
  Administered 2021-08-19: 15 mg via INTRAVENOUS
  Filled 2021-08-19: qty 1

## 2021-08-19 MED ORDER — GADOBUTROL 1 MMOL/ML IV SOLN
7.0000 mL | Freq: Once | INTRAVENOUS | Status: AC | PRN
  Administered 2021-08-19: 7 mL via INTRAVENOUS

## 2021-08-19 MED ORDER — DIPHENHYDRAMINE HCL 50 MG/ML IJ SOLN
25.0000 mg | Freq: Once | INTRAMUSCULAR | Status: AC
  Administered 2021-08-19: 25 mg via INTRAVENOUS
  Filled 2021-08-19: qty 1

## 2021-08-19 MED ORDER — SODIUM CHLORIDE 0.9 % IV BOLUS
500.0000 mL | Freq: Once | INTRAVENOUS | Status: AC
  Administered 2021-08-19: 500 mL via INTRAVENOUS

## 2021-08-19 MED ORDER — PROCHLORPERAZINE EDISYLATE 10 MG/2ML IJ SOLN
10.0000 mg | Freq: Once | INTRAMUSCULAR | Status: AC
  Administered 2021-08-19: 10 mg via INTRAVENOUS
  Filled 2021-08-19: qty 2

## 2021-08-19 NOTE — ED Triage Notes (Signed)
Pt continues to have rt sides chest pain and arm numbness/tingling with rt sided headache with dizziness for 2 days. Chest pain is reproducible with breathing and movement. Denies N/v or other symptoms ?

## 2021-08-19 NOTE — Discharge Instructions (Addendum)
We saw you in the ER for headaches. All the labs and imaging are normal. ?We are not sure what is causing your headaches, however, there appears to be no evidence of infection, bleeds or tumors based on our exam and results.  Our suspicion is that you are having complex migraine. ? ?Please take motrin and Tylenol every 6-8 hours for the next 2 to 3 days. ?We are giving you a referral to neurology service.  Call the number provided if you do not hear from them by Tuesday. ? ?

## 2021-08-19 NOTE — ED Notes (Signed)
Called x6 for vitals NA ?

## 2021-08-19 NOTE — ED Provider Notes (Signed)
?[facility]-EMERGENCY DEPT ?Provider Note ? ? ?CSN: [id number] ?Arrival date & time: 08/19/21  1329 ? ?  ? ?History ? ?Chief Complaint  ?Garner presents with  ? Chest Pain  ? Heaviness in rt arm  ? ? ?Sharon Garner is a 31 y.o. female. ? ?HPI ? ?  ?Sharon Garner comes in with chief complaint of right-sided chest pain, right-sided arm numbness/tingling, right-sided headache, dizziness and right-sided weakness. ? ?Garner has history of migraines.  She has no other medical history. ? ?She indicates that she started having right-sided chest pain about 2 days ago.  Chest pain is described as sharp pain that is intermittent, worse with movement and with deep inspiration.  She has no cough and denies any shortness of breath.  Pain is quite persistent when it is present.  No exertional component to it.  She has no history of similar pain in the past.  Symptoms started when she was at work.  Associated symptoms include tingling in her right upper extremity ? ?Yesterday, she started noticing tingling to the right side of her lower face and also some weakness in the right upper extremity.  Garner also started having some headaches on the right side, prompting her to finally come to the ER.  After waiting for several hours, she decided to leave yesterday from the ED and presents to the ER again this morning. ? ?Garner has not had a migraine in over a year.  Typically with her migraine, she has photophobia which is not present right now.  Additionally she has never had paresthesias or chest pain with her headache. ? ?She denies any family history of premature CAD, stroke, brain aneurysm, brain bleed.  She has no substance use disorder.  ? ?Pt has no hx of PE, DVT and denies any exogenous hormone (testosterone / estrogen) use, long distance travels or surgery in the past 6 weeks, active cancer, recent immobilization. ? ? ? ?Home Medications ?Prior to Admission medications   ?Medication Sig  Start Date End Date Taking? Authorizing Provider  ?acetaminophen (TYLENOL) 325 MG tablet Take 650 mg by mouth every 6 (six) hours as needed for moderate pain.    [provider]  ?coconut oil OIL Apply 1 application topically as needed (nipple pain). 09/07/20   Sheila Oats, MD  ?ferrous sulfate 325 (65 FE) MG tablet Take 1 tablet (325 mg total) by mouth every other day. 09/07/20   Sheila Oats, MD  ?ibuprofen (ADVIL) 600 MG tablet Take 1 tablet (600 mg total) by mouth every 6 (six) hours. 09/07/20   Sheila Oats, MD  ?oxyCODONE (OXY IR/ROXICODONE) 5 MG immediate release tablet Take 1 tablet (5 mg total) by mouth every 6 (six) hours as needed for severe pain or breakthrough pain. ?Garner not taking: Reported on 09/26/2020 09/07/20   Sheila Oats, MD  ?Prenatal Vit-Fe Fumarate-FA (PRENATAL VITAMINS) 28-0.8 MG TABS Take 1 tablet by mouth daily. 05/04/20   Nugent, Odie Sera, NP  ?   ? ?Allergies    ?Garner has no known allergies.   ? ?Review of Systems   ?Review of Systems  ?All other systems reviewed and are negative. ? ?Physical Exam ?Updated Vital Signs ?BP 116/73 (BP Location: Right Arm)   Pulse 62   Temp 98.6 ?F (37 ?C) (Oral)   Resp 16   Ht 5\' 1"  (1.549 m)   Wt 77.1 kg   SpO2 99%   BMI 32.12 kg/m?  ?Physical Exam ?Vitals and nursing note  reviewed.  ?Constitutional:   ?   Appearance: She is well-developed.  ?HENT:  ?   Head: Atraumatic.  ?Cardiovascular:  ?   Rate and Rhythm: Normal rate.  ?   Pulses:     ?     Radial pulses are 2+ on the right side and 2+ on the left side.  ?   Heart sounds: Normal heart sounds. No murmur heard. ?Pulmonary:  ?   Effort: Pulmonary effort is normal. No tachypnea.  ?   Breath sounds: Normal breath sounds. No decreased breath sounds, wheezing, rhonchi or rales.  ?Musculoskeletal:  ?   Cervical back: Normal range of motion and neck supple.  ?   Right lower leg: No edema.  ?   Left lower leg: No edema.  ?Skin: ?   General: Skin is warm and dry.  ?Neurological:  ?    General: No focal deficit present.  ?   Mental Status: She is alert and oriented to person, place, and time.  ?   Cranial Nerves: No cranial nerve deficit.  ?   Motor: No weakness.  ? ? ?ED Results / Procedures / Treatments   ?Labs ?(all labs ordered are listed, but only abnormal results are displayed) ?Labs Reviewed  ?COMPREHENSIVE METABOLIC PANEL - Abnormal; Notable for the following components:  ?    Result Value  ? Calcium 8.6 (*)   ? Anion gap 4 (*)   ? All other components within normal limits  ?CBC WITH DIFFERENTIAL/PLATELET  ?I-STAT BETA HCG BLOOD, ED ([facility], [facility], [facility] ONLY)  ?TROPONIN I (HIGH SENSITIVITY)  ?TROPONIN I (HIGH SENSITIVITY)  ? ? ?EKG ?EKG Interpretation ? ?Date/Time:  Saturday August 19 2021 13:37:23 EDT ?Ventricular Rate:  66 ?PR Interval:  161 ?QRS Duration: 93 ?QT Interval:  422 ?QTC Calculation: 443 ?R Axis:   55 ?Text Interpretation: Sinus rhythm No acute changes No significant change since last tracing Confirmed by [name] [phone]) on 08/19/2021 3:15:02 PM ? ?Radiology ?DG Chest 2 View ? ?Result Date: 08/18/2021 ?CLINICAL DATA:  Chest pain. EXAM: CHEST - 2 VIEW COMPARISON:  03/11/2019 FINDINGS: The cardiomediastinal contours are normal. The lungs are clear. Pulmonary vasculature is normal. No consolidation, pleural effusion, or pneumothorax. No acute osseous abnormalities are seen. IMPRESSION: Negative radiographs of the chest. Electronically Signed   By: Narda Rutherford M.D.   On: 08/18/2021 22:53  ? ?MR Brain W and Wo Contrast ? ?Result Date: 08/19/2021 ?CLINICAL DATA:  Headache, new or worsening, neuro deficit (Age 14-49y) headache, R sided weakness, numbness EXAM: MRI HEAD WITHOUT AND WITH CONTRAST TECHNIQUE: Multiplanar, multiecho pulse sequences of the brain and surrounding structures were obtained without and with intravenous contrast. CONTRAST:  56mL GADAVIST GADOBUTROL 1 MMOL/ML IV SOLN COMPARISON:  None. FINDINGS: Artifact from braces.  Findings below are within this limitation.  Brain: There is no acute infarction or hemorrhage. There is no intracranial mass, mass effect, or edema. There is no hydrocephalus or extra-axial fluid collection. Ventricles and sulci are normal in size and configuration. No abnormal enhancement. Vascular: Major vessel flow voids at the skull base are preserved. Skull and upper cervical spine: Normal marrow signal is preserved. Sinuses/Orbits: Paranasal sinuses are obscured. Orbits are obscured. Other: Sella is unremarkable.  Mastoid air cells are clear. IMPRESSION: Artifact from braces. Normal MRI of the head within this limitation on some sequences. Electronically Signed   By: Guadlupe Spanish M.D.   On: 08/19/2021 17:12   ? ?Procedures ?Procedures  ? ? ?Medications Ordered in ED ?  Medications  ?prochlorperazine (COMPAZINE) injection 10 mg (10 mg Intravenous Given 08/19/21 1419)  ?ketorolac (TORADOL) 15 MG/ML injection 15 mg (15 mg Intravenous Given 08/19/21 1419)  ?diphenhydrAMINE (BENADRYL) injection 25 mg (25 mg Intravenous Given 08/19/21 1419)  ?sodium chloride 0.9 % bolus 500 mL (500 mLs Intravenous New Bag/Given 08/19/21 1418)  ?gadobutrol (GADAVIST) 1 MMOL/ML injection 7 mL (7 mLs Intravenous Contrast Given 08/19/21 1613)  ? ? ?ED Course/ Medical Decision Making/ A&P ?  ?                        ?Medical Decision Making ?Amount and/or Complexity of Data Reviewed ?Labs: ordered. ?Radiology: ordered. ? ?Risk ?Prescription drug management. ? ? ?This Garner presents to the ED with chief complaint(s) of right-sided headache with right-sided upper extremity paresthesia, right-sided facial paresthesia, pleuritic chest pain with pertinent past medical history of migraines which further complicates the presenting complaint. The complaint involves an extensive differential diagnosis and treatment options and also carries with it a high risk of complications and morbidity.   ? ?Current headaches are different than her migraines, which appear to be relatively well  controlled. ?Her neuro exam is reassuring.  Cardiovascular exam is reassuring.  No carotid bruits.  Family history and social history are also reassuring. ? ?The differential diagnosis includes complex migraine, MS, strok

## 2021-08-21 ENCOUNTER — Telehealth: Payer: Self-pay | Admitting: *Deleted

## 2021-08-21 NOTE — Telephone Encounter (Signed)
Transition Care Management Follow-up Telephone Call ?Date of discharge and from where: 08/19/2021 - [facility] ?How have you been since you were released from the hospital? "Okay" ?Any questions or concerns? No ? ?Items Reviewed: ?Did the pt receive and understand the discharge instructions provided? Yes  ?Medications obtained and verified?  N/A ?Other? No  ?Any new allergies since your discharge? No  ?Dietary orders reviewed? No ?Do you have support at home? Yes  ? ? ?Functional Questionnaire: (I = Independent and D = Dependent) ?ADLs: I ? ?Bathing/Dressing- I ? ?Meal Prep- I ? ?Eating- I ? ?Maintaining continence- I ? ?Transferring/Ambulation- I ? ?Managing Meds- I ? ?Follow up appointments reviewed: ? ?PCP Hospital f/u appt confirmed? No  ?Specialist Hospital f/u appt confirmed? No  - referral pending to Neurology ?Are transportation arrangements needed? No  ?If their condition worsens, is the pt aware to call PCP or go to the Emergency Dept.? Yes ?Was the patient provided with contact information for the PCP's office or ED? Yes ?Was to pt encouraged to call back with questions or concerns? Yes  ?

## 2021-09-19 ENCOUNTER — Other Ambulatory Visit: Payer: Self-pay | Admitting: Neurology

## 2021-09-19 ENCOUNTER — Ambulatory Visit: Payer: Medicaid Other | Admitting: Neurology

## 2021-09-19 ENCOUNTER — Encounter: Payer: Self-pay | Admitting: Neurology

## 2021-09-19 VITALS — BP 123/77 | HR 80 | Ht 61.0 in | Wt 170.0 lb

## 2021-09-19 DIAGNOSIS — Z79899 Other long term (current) drug therapy: Secondary | ICD-10-CM | POA: Diagnosis not present

## 2021-09-19 DIAGNOSIS — G43711 Chronic migraine without aura, intractable, with status migrainosus: Secondary | ICD-10-CM

## 2021-09-19 MED ORDER — AJOVY 225 MG/1.5ML ~~LOC~~ SOAJ: 225.0000 mg | SUBCUTANEOUS | 11 refills | Status: DC

## 2021-09-19 MED ORDER — RIZATRIPTAN BENZOATE 10 MG PO TBDP: 10.0000 mg | ORAL_TABLET | ORAL | 11 refills | Status: DC | PRN

## 2021-09-19 MED ORDER — ONDANSETRON 4 MG PO TBDP: 4.0000 mg | ORAL_TABLET | Freq: Three times a day (TID) | ORAL | 3 refills | Status: DC | PRN

## 2021-09-19 NOTE — Progress Notes (Addendum)
[facility]    Provider:  Dr Sharon Garner Requesting Provider: [name], MD Primary Care Provider:  Patient, No Pcp Per (Inactive)  CC:  Headache/migraines  HPI:  Sharon Garner is a 31 y.o. female here as requested by Sharon Kaplan, MD for headaches. PMHx kidney stones, migraine, UTI, family history of intellectual disability, back pain in pregnancy.  I reviewed emergency room notes from August 19, 2021, Dr. Rhunette Garner who is the referring physician, she came in with a complaint of right-sided chest pain, right-sided arm numbness tingling, right-sided headache, dizziness and right-sided weakness.  She started having chest pain several days prior, sharp pain intermittent worse with movement and deep inspiration, she also started noticing tingling to the right side of her lower face and also some weakness in the right upper extremity, also started having some headaches on the right side prompting her to finally come to the emergency room, patient has not had a migraine in over a year, typically with her migraine she has photophobia which is not present right now, additionally she has never had paresthesias or chest pain with her headache, she was given a migraine cocktail in the emergency room including Compazine, ketorolac, Benadryl, fluids for her right-sided headache with right-sided upper extremity paresthesias facial paresthesias.Patient presented to the emergency room April 29 of this year for a chief complaint of right-sided headache with right-sided upper extremity paresthesias and right-sided facial paresthesias, migraines have been well controlled, current headache much different than prior migraines, neuro exam was reassuring, cardiovascular exam was reassuring, no carotid bruits, differential included complex migraine stroke and other etiologies.  EKG showed no signs of ischemia or right-sided heart strain.  ACS is low in the differential.  Pulmonary exam was normal,  no hypoxia or other signs, x-ray showed normal lung findings, headache cocktail and MRI of the brain was provided.  To follow-up with neurology for "complex migraines".  She has been having headaches for 13 years, after having her daughter. She started having bad headaches for a year with photophobia/phonophobia/osmophobia, she works in [employer] and has been vomiting, pulsating/punding/throbbing, this year she vomits, she feels hot and gets a red face, severe and painful she can't seven get off her bed. Hurst to move. A dar room helps. Cold helps. She has migraines every day. She has a paternal aunt with migraines. She saw someone in the past, she went to a doctor in [place] 6 years ago she was given a pill she was supposed, she is not having anymore children, not breastfeeding, lasting 24-48 hours, not worse with periods, occurs every day, worsening. Moderate to severe. Mainly on the right side unilateral and radiates down the neck with neck pain.No other focal neurologic deficits, associated symptoms, inciting events or modifiable factors.  Reviewed notes, labs and imaging from outside physicians, which showed:  Personally reviewed MRI of the brain images which were normal.  Thorough review of records medications tried that can be used in migraine and headache management include: Tylenol, Fioricet, Flexeril, Decadron injections, Advil, multiple Toradol injections, Reglan, naproxen, Zofran oral and injections, prednisone tablets, Compazine injections and tablets, Phenergan tablets and injections, propranolol extended release, scopolamine patches, Topiramate contraindicated due to kidney stones, she took a preventative in the past per review of records below from Neurology at [facility] she tried propranolol for 8 months, she also took TCA for > 11months per [facility] 2 [facility] with lumbar puncture and workup as reords reviewed below from prior neurology. She  had tubal  ligation, she is done, had it done on May 16th. Aimovig contraindicated due to constipation, imitrex,  Reviewed [facility] notes neurology as below:  Referring Physician: Redge Garner Primary Care Physician: Provider Not In System  HPI: [name] is a 31 y.o. female with no other past medical history who is referred for evaluation of headaches.  Her first severe headache was in 2011 and she went to [facility] in [place]. She received an injection and they did an LP at that time and sent her home. She states she then developed a worse headache that would be exacerbated with sitting up and she went to [facility] in [facility] where she received a blood patch. Additionally, she thinks she had a CT head there but never an MRI brain before. She does not have the records at this time, but will have records sent.   Since 2011 she has been having headaches every 2-3 weeks, except for the past 5 months she has been having them nearly daily. She has been to [facility] 4-5 times over the past year requiring injections for headaches.   She describes her headaches as right sided feeling hot and like something is running through her head. Pain will always start with heat in neck and within a few minutes (<1 hour) will spread to include entire side of head. Describes sharp and throbbing 10/10 pain from the back of her neck to the front with associated eye pain. Will have black dots in vision. Photo/phonophobia, sometimes will have nausea. Emesis has occurred with two of them. Pain will last hours, has had a few where pain lasts >48 hours. Has tried Excedrin, Tylenol for headaches and is currently taking Tylenol daily. Better with cold compress to head, slight improvement with lying down. Worse with bending over. Start any time of day, including will wake up in the morning with pain. States she is getting these headaches every day for the past 5-6 months. Previously was once every 2-3 weeks. No  conjunctival injection, but will have tearing R>L eye and bilateral rhinorrhea. Sensitivity to touch over temple.   No change in headaches with menses. Has not noticed any food/drink triggers. Will occasionally wake up with severe headache, over the past month this has happened 3 times but never before then. Has no trouble with sleep when she doesn't have a headache. Will typically sleep 7-8 hours uninterrupted. Denies snoring or waking up gasping for air. Does admit to hair loss that started the same time as her headaches. Denies any heat/cold intolerance, fatigue, or palpitations.   Review of Systems: Patient complains of symptoms per HPI as well as the following symptoms post partum, not breastfeeding, tubal ligation. Pertinent negatives and positives per HPI. All others negative.   Social History   Socioeconomic History   Marital status: Married    Spouse name: Not on file   Number of children: Not on file   Years of education: Not on file   Highest education level: Not on file  Occupational History   Not on file  Tobacco Use   Smoking status: Never   Smokeless tobacco: Never  Vaping Use   Vaping Use: Never used  Substance and Sexual Activity   Alcohol use: No   Drug use: No   Sexual activity: Not Currently    Birth control/protection: None  Other Topics Concern   Not on file  Social History Narrative   Not on file   Social  Determinants of Health   Financial Resource Strain: Not on file  Food Insecurity: Not on file  Transportation Needs: Not on file  Physical Activity: Not on file  Stress: Not on file  Social Connections: Not on file  Intimate Partner Violence: Not on file    Family History  Problem Relation Age of Onset   Cancer Mother        reproductive   Migraines Paternal Aunt    Other Neg Hx    Alcohol abuse Neg Hx    Arthritis Neg Hx    Asthma Neg Hx    Birth defects Neg Hx    COPD Neg Hx    Depression Neg Hx    Diabetes Neg Hx    Drug abuse Neg Hx     Early death Neg Hx    Hearing loss Neg Hx    Heart disease Neg Hx    Hyperlipidemia Neg Hx    Hypertension Neg Hx    Kidney disease Neg Hx    Learning disabilities Neg Hx    Mental illness Neg Hx    Mental retardation Neg Hx    Miscarriages / Stillbirths Neg Hx    Stroke Neg Hx    Vision loss Neg Hx     Past Medical History:  Diagnosis Date   Kidney stones    kidney stones removed 2021   Migraine    Ovarian cyst     Patient Active Problem List   Diagnosis Date Noted   History of 2 cesarean sections 09/05/2020   Cesarean delivery delivered 09/05/2020   [redacted] weeks gestation of pregnancy 08/11/2020   UTI (urinary tract infection) during pregnancy 07/26/2020   Unwanted fertility 06/01/2020   Round ligament pain 05/04/2020   Back pain in pregnancy 05/04/2020   Obesity in pregnancy 05/04/2020   Supervision of other normal pregnancy, antepartum 01/28/2020   Short interval between pregnancies affecting pregnancy in third trimester, antepartum 01/28/2020   History of C-section 05/11/2019   Family history of intellectual disability 05/16/2012   Chronic migraine without aura, with intractable migraine, so stated, with status migrainosus 11/09/2011    Past Surgical History:  Procedure Laterality Date   CESAREAN SECTION N/A 10/17/2012   Procedure: CESAREAN SECTION;  Surgeon: Tereso Newcomer, MD;  Location: [facility];  Service: Obstetrics;  Laterality: N/A;   CESAREAN SECTION N/A 05/11/2019   Procedure: CESAREAN SECTION;  Surgeon: Conan Bowens, MD;  Location: [facility];  Service: Obstetrics;  Laterality: N/A;   CESAREAN SECTION WITH BILATERAL TUBAL LIGATION N/A 09/05/2020   Procedure: CESAREAN SECTION WITH BILATERAL TUBAL LIGATION;  Surgeon: Warden Fillers, MD;  Location: [facility];  Service: Obstetrics;  Laterality: N/A;   KIDNEY STONE SURGERY  2021    Current Outpatient Medications  Medication Sig Dispense Refill   acetaminophen (TYLENOL) 325 MG tablet Take 650 mg by mouth  every 6 (six) hours as needed for moderate pain.     ferrous sulfate 325 (65 FE) MG tablet Take 1 tablet (325 mg total) by mouth every other day. 30 tablet 2   Fremanezumab-vfrm (AJOVY) 225 MG/1.5ML SOAJ Inject 225 mg into the skin every 30 (thirty) days. 1.5 mL 11   ibuprofen (ADVIL) 600 MG tablet Take 1 tablet (600 mg total) by mouth every 6 (six) hours. 30 tablet 0   ondansetron (ZOFRAN-ODT) 4 MG disintegrating tablet Take 1-2 tablets (4-8 mg total) by mouth every 8 (eight) hours as needed. 30 tablet 3   Prenatal Vit-Fe  Fumarate-FA (PRENATAL VITAMINS) 28-0.8 MG TABS Take 1 tablet by mouth daily. 30 tablet 6   rizatriptan (MAXALT-MLT) 10 MG disintegrating tablet Take 1 tablet (10 mg total) by mouth as needed for migraine. May repeat in 2 hours if needed 9 tablet 11   No current facility-administered medications for this visit.    Allergies as of 09/19/2021   (No Known Allergies)    Vitals: BP 123/77   Pulse 80   Ht  (1.549 m)   Wt 170 lb (77.1 kg)   BMI 32.12 kg/m  Last Weight:  Wt Readings from Last 1 Encounters:  09/19/21 170 lb (77.1 kg)   Last Height:   Ht Readings from Last 1 Encounters:  09/19/21  (1.549 m)     Physical exam: Exam: Gen: NAD, conversant, well nourised, obese, well groomed                     CV: RRR, no MRG. No Carotid Bruits. No peripheral edema, warm, nontender Eyes: Conjunctivae clear without exudates or hemorrhage  Neuro: Detailed Neurologic Exam  Speech:    Speech is normal; fluent and spontaneous with normal comprehension.  Cognition:    The patient is oriented to person, place, and time;     recent and remote memory intact;     language fluent;     normal attention, concentration,     fund of knowledge Cranial Nerves:    The pupils are equal, round, and reactive to light. The fundi are normal and spontaneous venous pulsations are present. Visual fields are full to finger confrontation. Extraocular movements are intact.  Trigeminal sensation is intact and the muscles of mastication are normal. The face is symmetric. The palate elevates in the midline. Hearing intact. Voice is normal. Shoulder shrug is normal. The tongue has normal motion without fasciculations.   Coordination:    Normal finger to nose and heel to shin.   Gait:     normal.   Motor Observation:    No asymmetry, no atrophy, and no involuntary movements noted. Tone:    Normal muscle tone.    Posture:    Posture is normal. normal erect    Strength:    Strength is V/V in the upper and lower limbs.      Sensation: intact to LT     Reflex Exam:  DTR's:    Deep tendon reflexes in the upper and lower extremities are normal bilaterally.   Toes:    The toes are downgoing bilaterally.   Clonus:    Clonus is absent.    Assessment/Plan: Patient presented to the emergency room April 29 of this year for a chief complaint of right-sided headache with right-sided upper extremity paresthesias and right-sided facial paresthesias, migraines daily for > 1 year, neuro exam normal today, cardiovascular exam was reassuring in the ED and today, (no carotid bruits,  EKG showed no signs of ischemia or right-sided heart strain). Headache cocktail and MRI of the brain was provided. Likely chronic migraines  Prevention: Ajovy montlhy. If insurance likes Emgality better we may have to switch( equivalent meds) As needed: Maxalt/izatriptan. Please take one tablet at the onset of your headache. If it does not improve the symptoms please take one additional tablet. Do not take more then 2 tablets in 24hrs. Do not take use more then 2 to 3 times in a week.  She had numbness and tinging with the last migraine, this does not qualify for migraine with aura (  need to have at least 2 per [facility] criteria) but will monitor because discussed increased risk of stroke with migraine with aura. Gave literature. Also discussed teratogenicity of Ajovy do not get pregnant for 6 months  after stopping (she recently had tubal ligation, will also check a urine pregnancy test). Not breastfeeding.   Discussed: To prevent or relieve headaches, try the following: Cool Compress. Lie down and place a cool compress on your head.  Avoid headache triggers. If certain foods or odors seem to have triggered your migraines in the past, avoid them. A headache diary might help you identify triggers.  Include physical activity in your daily routine. Try a daily walk or other moderate aerobic exercise.  Manage stress. Find healthy ways to cope with the stressors, such as delegating tasks on your to-do list.  Practice relaxation techniques. Try deep breathing, yoga, massage and visualization.  Eat regularly. Eating regularly scheduled meals and maintaining a healthy diet might help prevent headaches. Also, drink plenty of fluids.  Follow a regular sleep schedule. Sleep deprivation might contribute to headaches Consider biofeedback. With this mind-body technique, you learn to control certain bodily functions -- such as muscle tension, heart rate and blood pressure -- to prevent headaches or reduce headache pain.    Proceed to emergency room if you experience new or worsening symptoms or symptoms do not resolve, if you have new neurologic symptoms or if headache is severe, or for any concerning symptom.   Provided education and documentation from American headache Society toolbox including articles on: chronic migraine medication overuse headache, chronic migraines, prevention of migraines, behavioral and other nonpharmacologic treatments for headache.   Orders Placed This Encounter  Procedures   Pregnancy, urine   Meds ordered this encounter  Medications   Fremanezumab-vfrm (AJOVY) 225 MG/1.5ML SOAJ    Sig: Inject 225 mg into the skin every 30 (thirty) days.    Dispense:  1.5 mL    Refill:  11   rizatriptan (MAXALT-MLT) 10 MG disintegrating tablet    Sig: Take 1 tablet (10 mg total) by  mouth as needed for migraine. May repeat in 2 hours if needed    Dispense:  9 tablet    Refill:  11   ondansetron (ZOFRAN-ODT) 4 MG disintegrating tablet    Sig: Take 1-2 tablets (4-8 mg total) by mouth every 8 (eight) hours as needed.    Dispense:  30 tablet    Refill:  3    Cc: Sharon KaplanNanavati, Ankit, MD,  Patient, No Pcp Per (Inactive)  Naomie DeanAntonia Neeva Trew, MD  [facility] [address] [place], [place] [id number]  Phone [phone] Fax [phone]

## 2021-09-19 NOTE — Patient Instructions (Addendum)
Prevention: Ajovy montlhy. If insurance likes Emgality better we may have to switch( equivalent meds) As needed: Maxalt/izatriptan. Please take one tablet at the onset of your headache. If it does not improve the symptoms please take one additional tablet. Do not take more then 2 tablets in 24hrs. Do not take use more then 2 to 3 times in a week. May take with the Ondansetron and tylenol/alleve as needed Ondansetron for nausea: May take with the rizatriptan and tylenol/alleve if needed  Meds ordered this encounter  Medications   Fremanezumab-vfrm (AJOVY) 225 MG/1.5ML SOAJ    Sig: Inject 225 mg into the skin every 30 (thirty) days.    Dispense:  1.5 mL    Refill:  11   rizatriptan (MAXALT-MLT) 10 MG disintegrating tablet    Sig: Take 1 tablet (10 mg total) by mouth as needed for migraine. May repeat in 2 hours if needed    Dispense:  9 tablet    Refill:  11   ondansetron (ZOFRAN-ODT) 4 MG disintegrating tablet    Sig: Take 1-2 tablets (4-8 mg total) by mouth every 8 (eight) hours as needed.    Dispense:  30 tablet    Refill:  3     Analgesic Rebound Headache An analgesic rebound headache, sometimes called a medication overuse headache or a drug-induced headache, is a secondary disorder that is caused by the overuse of pain medicine (analgesic) to treat the original (primary) headache. Any type of primary headache can return as a rebound headache if a person regularly takes analgesics. The types of primary headaches that are commonly associated with rebound headaches include: Migraines. Headaches that are caused by tense muscles in the head and neck area (tension headaches). Headaches that develop and happen again on one side of the head and around the eye (cluster headaches). If rebound headaches continue, they can become long-term, daily headaches. What are the causes? This condition may be caused by frequent use of: Over-the-counter medicines such as aspirin, ibuprofen, and  acetaminophen. Sinus-relief medicines and medicines that contain caffeine. Narcotic pain medicines such as codeine and oxycodone. Some prescription migraine medicines. What are the signs or symptoms? The symptoms of a rebound headache are the same as the symptoms of the original headache. Some of the symptoms of specific types of headaches include: Migraine headache Pulsing or throbbing pain on one or both sides of the head. Severe pain that interferes with daily activities. Pain that gets worse with physical activity. Nausea, vomiting, or both. Pain and sensitivity with exposure to bright light, loud noises, or strong smells. Visual changes. Numbness of one or both arms. Tension headache Pressure around the head. Dull, aching head pain. Pain felt over the front and sides of the head. Tenderness in the muscles of the head, neck, and shoulders. Cluster headache Severe pain that begins in or around one eye or temple. Droopy or swollen eyelid, or redness and tearing in the eye on the same side as the pain. One-sided head pain. Nausea. Runny nose. Sweaty, pale facial skin. Restlessness. How is this diagnosed? This condition is diagnosed by: Reviewing your medical history. This includes the nature of your primary headaches. Reviewing the types of pain medicines that you have been using to treat your primary headaches and how often you take them. How is this treated? This condition may be treated or managed by: Discontinuing frequent use of the analgesic medicine. Doing this may worsen your headaches at first, but the pain should eventually become more manageable, less frequent,  and less severe. Seeing a headache specialist. He or she may be able to help you manage your headaches and help make sure there is not another cause of the headaches. Using methods of stress relief, such as acupuncture, counseling, biofeedback, and massage. Talk with your health care provider about which methods  might be good for you. Follow these instructions at home: Medicines  Take over-the-counter and prescription medicines only as told by your health care provider. Stop the repeated use of pain medicine as told by your health care provider. Stopping can be difficult. Carefully follow instructions from your health care provider. Lifestyle  Follow a regular sleep schedule. Do not vary the time that you go to bed or the amount that you sleep from day to day. It is important to stay on the same schedule to help prevent headaches. Get 7-9 hours of sleep each night, or the amount recommended by your health care provider. Exercise regularly. Exercise for at least 30 minutes, 5 times each week. Limit or manage stress. Consider stress-relief options such as acupuncture, counseling, biofeedback, and massage. Talk with your health care provider about which methods might be good for you. Do not drink alcohol. Do not use any products that contain nicotine or tobacco, such as cigarettes, e-cigarettes, and chewing tobacco. If you need help quitting, ask your health care provider. General instructions Avoid triggers that are known to cause your primary headaches. Keep all follow-up visits as told by your health care provider. This is important. Contact a health care provider if: You continue to experience headaches after following treatments that your health care provider recommended. Get help right away if you have: New headache pain. Headache pain that is different than what you have experienced in the past. Numbness or tingling in your arms or legs. Changes in your speech or vision. Summary An analgesic rebound headache, sometimes called a medication overuse headache or a drug-induced headache, is caused by the overuse of pain medicine (analgesic) to treat the original (primary) headache. Any type of primary headache can return as a rebound headache if a person regularly takes analgesics. The types of  primary headaches that are commonly associated with rebound headaches include migraines, tension headaches, and cluster headaches. Analgesic rebound headaches can occur with frequent use of over-the-counter medicines and prescription medicines. Treatment involves stopping the medicine that is being overused. This will improve headache frequency and severity. This information is not intended to replace advice given to you by your health care provider. Make sure you discuss any questions you have with your health care provider. Document Revised: 05/07/2019 Document Reviewed: 05/07/2019 Elsevier Patient Education  [place]   There is increased risk for stroke in women with migraine with aura and a contraindication for the combined contraceptive pill for use by women who have migraine with aura. The risk for women with migraine without aura is lower. However other risk factors like smoking are far more likely to increase stroke risk than migraine. There is a recommendation for no smoking and for the use of OCPs without estrogen such as progestogen only pills particularly for women with migraine with aura.Marland Kitchen People who have migraine headaches with auras may be 3 times more likely to have a stroke caused by a blood clot, compared to migraine patients who don't see auras. Women who take hormone-replacement therapy may be 30 percent more likely to suffer a clot-based stroke than women not taking medication containing estrogen. Other risk factors like smoking and high blood pressure may  be  much more important.  Ondansetron Dissolving Tablets What is this medication? ONDANSETRON (on DAN se tron) prevents nausea and vomiting from chemotherapy, radiation, or surgery. It works by blocking substances in the body that may cause nausea or vomiting. It belongs to a group of medications called antiemetics. This medicine may be used for other purposes; ask your health care provider or pharmacist if you have  questions. COMMON BRAND NAME(S): Zofran ODT What should I tell my care team before I take this medication? They need to know if you have any of these conditions: Heart disease History of irregular heartbeat Liver disease Low levels of magnesium or potassium in the blood An unusual or allergic reaction to ondansetron, granisetron, other medications, foods, dyes, or preservatives Pregnant or trying to get pregnant Breast-feeding How should I use this medication? These tablets are made to dissolve in the mouth. Do not try to push the tablet through the foil backing. With dry hands, peel away the foil backing and gently remove the tablet. Place the tablet in the mouth and allow it to dissolve, then swallow. While you may take these tablets with water, it is not necessary to do so. Talk to your care team regarding the use of this medication in children. Special care may be needed. Overdosage: If you think you have taken too much of this medicine contact a poison control center or emergency room at once. NOTE: This medicine is only for you. Do not share this medicine with others. What if I miss a dose? If you miss a dose, take it as soon as you can. If it is almost time for your next dose, take only that dose. Do not take double or extra doses. What may interact with this medication? Do not take this medication with any of the following: Apomorphine Certain medications for fungal infections like fluconazole, itraconazole, ketoconazole, posaconazole, voriconazole Cisapride Dronedarone Pimozide Thioridazine This medication may also interact with the following: Carbamazepine Certain medications for depression, anxiety, or psychotic disturbances Fentanyl Linezolid MAOIs like Carbex, Eldepryl, Marplan, Nardil, and Parnate Methylene blue (injected into a vein) Other medications that prolong the QT interval (cause an abnormal heart rhythm) like dofetilide,  ziprasidone Phenytoin Rifampicin Tramadol This list may not describe all possible interactions. Give your health care provider a list of all the medicines, herbs, non-prescription drugs, or dietary supplements you use. Also tell them if you smoke, drink alcohol, or use illegal drugs. Some items may interact with your medicine. What should I watch for while using this medication? Check with your care team as soon as you can if you have any sign of an allergic reaction. What side effects may I notice from receiving this medication? Side effects that you should report to your care team as soon as possible: Allergic reactions--skin rash, itching, hives, swelling of the face, lips, tongue, or throat Bowel blockage--stomach cramping, unable to have a bowel movement or pass gas, loss of appetite, vomiting Chest pain (angina)--pain, pressure, or tightness in the chest, neck, back, or arms Heart rhythm changes--fast or irregular heartbeat, dizziness, feeling faint or lightheaded, chest pain, trouble breathing Irritability, confusion, fast or irregular heartbeat, muscle stiffness, twitching muscles, sweating, high fever, seizure, chills, vomiting, diarrhea, which may be signs of serotonin syndrome Side effects that usually do not require medical attention (report to your care team if they continue or are bothersome): Constipation Diarrhea General discomfort and fatigue Headache This list may not describe all possible side effects. Call your doctor for medical advice  about side effects. You may report side effects to FDA at 1-800-FDA-1088. Where should I keep my medication? Keep out of the reach of children and pets. Store between 2 and 30 degrees C (36 and 86 degrees F). Throw away any unused medication after the expiration date. NOTE: This sheet is a summary. It may not cover all possible information. If you have questions about this medicine, talk to your doctor, pharmacist, or health care  provider.  2023 Elsevier/Gold Standard (2020-05-13 00:00:00) Rizatriptan Tablets What is this medication? RIZATRIPTAN (rye za TRIP tan) treats migraines. It works by blocking pain signals and narrowing blood vessels in the brain. It belongs to a group of medications called triptans. It is not used to prevent migraines. This medicine may be used for other purposes; ask your health care provider or pharmacist if you have questions. COMMON BRAND NAME(S): Maxalt What should I tell my care team before I take this medication? They need to know if you have any of these conditions: Cigarette smoker Circulation problems in fingers and toes Diabetes Heart disease High blood pressure High cholesterol History of irregular heartbeat History of stroke Kidney disease Liver disease Stomach or intestine problems An unusual or allergic reaction to rizatriptan, other medications, foods, dyes, or preservatives Pregnant or trying to get pregnant Breast-feeding How should I use this medication? Take this medication by mouth with a glass of water. Follow the directions on the prescription label. Do not take it more often than directed. Talk to your care team regarding the use of this medication in children. While this medication may be prescribed for children as young as 6 years for selected conditions, precautions do apply. Overdosage: If you think you have taken too much of this medicine contact a poison control center or emergency room at once. NOTE: This medicine is only for you. Do not share this medicine with others. What if I miss a dose? This does not apply. This medication is not for regular use. What may interact with this medication? Do not take this medication with any of the following: Certain medications for migraine headache like almotriptan, eletriptan, frovatriptan, naratriptan, rizatriptan, sumatriptan, zolmitriptan Ergot alkaloids like dihydroergotamine, ergonovine, ergotamine,  methylergonovine MAOIs like Carbex, Eldepryl, Marplan, Nardil, and Parnate This medication may also interact with the following: Certain medications for depression, anxiety, or psychotic disorders Propranolol This list may not describe all possible interactions. Give your health care provider a list of all the medicines, herbs, non-prescription drugs, or dietary supplements you use. Also tell them if you smoke, drink alcohol, or use illegal drugs. Some items may interact with your medicine. What should I watch for while using this medication? Visit your care team for regular checks on your progress. Tell your care team if your symptoms do not start to get better or if they get worse. You may get drowsy or dizzy. Do not drive, use machinery, or do anything that needs mental alertness until you know how this medication affects you. Do not stand up or sit up quickly, especially if you are an older patient. This reduces the risk of dizzy or fainting spells. Alcohol may interfere with the effect of this medication. Your mouth may get dry. Chewing sugarless gum or sucking hard candy and drinking plenty of water may help. Contact your care team if the problem does not go away or is severe. If you take migraine medications for 10 or more days a month, your migraines may get worse. Keep a diary of headache days and  medication use. Contact your care team if your migraine attacks occur more frequently. What side effects may I notice from receiving this medication? Side effects that you should report to your care team as soon as possible: Allergic reactions--skin rash, itching, hives, swelling of the face, lips, tongue, or throat Burning, pain, tingling, or color changes in the legs or feet Heart attack--pain or tightness in the chest, shoulders, arms, or jaw, nausea, shortness of breath, cold or clammy skin, feeling faint or lightheaded Heart rhythm changes--fast or irregular heartbeat, dizziness, feeling faint  or lightheaded, chest pain, trouble breathing Increase in blood pressure Irritability, confusion, fast or irregular heartbeat, muscle stiffness, twitching muscles, sweating, high fever, seizure, chills, vomiting, diarrhea, which may be signs of serotonin syndrome Raynaud's--cool, numb, or painful fingers or toes that may change color from pale, to blue, to red Seizures Stroke--sudden numbness or weakness of the face, arm, or leg, trouble speaking, confusion, trouble walking, loss of balance or coordination, dizziness, severe headache, change in vision Sudden or severe stomach pain, nausea, vomiting, fever, or bloody diarrhea Vision loss Side effects that usually do not require medical attention (report to your care team if they continue or are bothersome): Dizziness General discomfort or fatigue This list may not describe all possible side effects. Call your doctor for medical advice about side effects. You may report side effects to FDA at 1-800-FDA-1088. Where should I keep my medication? Keep out of the reach of children and pets. Store at room temperature between 15 and 30 degrees C (59 and 86 degrees F). Keep container tightly closed. Throw away any unused medication after the expiration date. NOTE: This sheet is a summary. It may not cover all possible information. If you have questions about this medicine, talk to your doctor, pharmacist, or health care provider.  2023 Elsevier/Gold Standard (2020-05-18 00:00:00) Rolanda Lundborg Injection What is this medication? FREMANEZUMAB (fre ma NEZ ue mab) prevents migraines. It works by blocking a substance in the body that causes migraines. It is a monoclonal antibody. This medicine may be used for other purposes; ask your health care provider or pharmacist if you have questions. COMMON BRAND NAME(S): AJOVY What should I tell my care team before I take this medication? They need to know if you have any of these conditions: An unusual or allergic  reaction to fremanezumab, other medications, foods, dyes, or preservatives Pregnant or trying to get pregnant Breast-feeding How should I use this medication? This medication is injected under the skin. You will be taught how to prepare and give it. Take it as directed on the prescription label. Keep taking it unless your care team tells you to stop. It is important that you put your used needles and syringes in a special sharps container. Do not put them in a trash can. If you do not have a sharps container, call your pharmacist or care team to get one. Talk to your care team about the use of this medication in children. Special care may be needed. Overdosage: If you think you have taken too much of this medicine contact a poison control center or emergency room at once. NOTE: This medicine is only for you. Do not share this medicine with others. What if I miss a dose? If you miss a dose, take it as soon as you can. If it is almost time for your next dose, take only that dose. Do not take double or extra doses. What may interact with this medication? Interactions are not expected. This list  may not describe all possible interactions. Give your health care provider a list of all the medicines, herbs, non-prescription drugs, or dietary supplements you use. Also tell them if you smoke, drink alcohol, or use illegal drugs. Some items may interact with your medicine. What should I watch for while using this medication? Tell your care team if your symptoms do not start to get better or if they get worse. What side effects may I notice from receiving this medication? Side effects that you should report to your care team as soon as possible: Allergic reactions or angioedema--skin rash, itching or hives, swelling of the face, eyes, lips, tongue, arms, or legs, trouble swallowing or breathing Side effects that usually do not require medical attention (report to your care team if they continue or are  bothersome): Pain, redness, or irritation at injection site This list may not describe all possible side effects. Call your doctor for medical advice about side effects. You may report side effects to FDA at 1-800-FDA-1088. Where should I keep my medication? Keep out of the reach of children and pets. Store in a refrigerator or at room temperature between 20 and 25 degrees C (68 and 77 degrees F). Refrigeration (preferred): Store in the refrigerator. Do not freeze. Keep in the original container until you are ready to take it. Remove the dose from the carton about 30 minutes before it is time for you to use it. If the dose is not used, it may be stored in the original container at room temperature for 7 days. Get rid of any unused medication after the expiration date. Room Temperature: This medication may be stored at room temperature for up to 7 days. Keep it in the original container. Protect from light until time of use. If it is stored at room temperature, get rid of any unused medication after 7 days or after it expires, whichever is first. To get rid of medications that are no longer needed or have expired: Take the medication to a medication take-back program. Check with your pharmacy or law enforcement to find a location. If you cannot return the medication, ask your pharmacist or care team how to get rid of this medication safely. NOTE: This sheet is a summary. It may not cover all possible information. If you have questions about this medicine, talk to your doctor, pharmacist, or health care provider.  2023 Elsevier/Gold Standard (2021-06-02 00:00:00)

## 2021-09-20 ENCOUNTER — Encounter: Payer: Self-pay | Admitting: Neurology

## 2021-09-20 LAB — PREGNANCY, URINE: Preg Test, Ur: NEGATIVE

## 2021-09-21 ENCOUNTER — Other Ambulatory Visit: Payer: Self-pay | Admitting: Neurology

## 2021-09-21 ENCOUNTER — Telehealth: Payer: Self-pay | Admitting: Neurology

## 2021-09-21 MED ORDER — EMGALITY 120 MG/ML ~~LOC~~ SOAJ: 120.0000 mg | SUBCUTANEOUS | 5 refills | Status: DC

## 2021-09-21 NOTE — Telephone Encounter (Signed)
Received the patient's insurance card through [employer] to complete prior auth for Ajovy. PA initiated on [facility]MM/optum[facility] community healthcare plan. KEY:[id number]  "medications tried that can be used in migraine and headache management include: Tylenol, Fioricet, Flexeril, Decadron injections, Advil, multiple Toradol injections, Reglan, naproxen, Zofran oral and injections, prednisone tablets, Compazine injections and tablets, Phenergan tablets and injections, propranolol extended release, scopolamine patches, Topiramate contraindicated due to kidney stones, she took a preventative in the past per review of records below from Neurology at [facility] she tried propranolol for 8 months, she also took TCA for > 26months per records from [facility] for 2 weeks with lumbar puncture and workup as reords reviewed below from prior neurology. She had tubal ligation, she is done, had it done on May 16th. Aimovig contraindicated due to constipation, imitrex"  Will await determination which can take 24-72 hrs

## 2021-09-21 NOTE — Addendum Note (Signed)
Addended by: Judi Cong on: 09/21/2021 11:41 AM   Modules accepted: Orders

## 2021-09-21 NOTE — Telephone Encounter (Signed)
Attempted PA for [facility] for the patient since this was the preferred. Appears a PA already on file for this medication until sept.  Pt should be able to get the medication.   "We received a prior authorization request for the member and product listed above. The Community and [facility] Prior Authorization Team is not able to review this request because the requested medication has been previously approved under [id number]. Based on the information reviewed, the requested prescription is currently authorized for coverage by the plan until 2021-12-22."

## 2021-09-21 NOTE — Telephone Encounter (Signed)
PA denied. Pt must try emgality as this is listed as preferred.

## 2021-09-21 NOTE — Addendum Note (Signed)
Addended by: Judi Cong on: 09/21/2021 11:42 AM   Modules accepted: Orders

## 2021-09-21 NOTE — Addendum Note (Signed)
Addended by: Judi Cong on: 09/21/2021 09:42 AM   Modules accepted: Orders

## 2021-11-24 DIAGNOSIS — H5213 Myopia, bilateral: Secondary | ICD-10-CM | POA: Diagnosis not present

## 2021-12-07 ENCOUNTER — Telehealth: Payer: Self-pay | Admitting: Neurology

## 2021-12-07 NOTE — Telephone Encounter (Signed)
Spoke to Patient and pharmacy . Pharmacy states Emgality is ordered and will be in stock tomorrow . Pt was taking Ajovy but insurance wouldn't cover medication. Pt states she has beent taking Emgality since June. Informed patient Emgality ordered. Pt thanked me for calling

## 2021-12-07 NOTE — Telephone Encounter (Signed)
Pt called stating that her Medicaid is needing a new referral in order to approve her Galcanezumab-gnlm (EMGALITY) 120 MG/ML SOAJ Please advise.

## 2022-01-22 ENCOUNTER — Encounter: Payer: Self-pay | Admitting: Family Medicine

## 2022-01-22 ENCOUNTER — Ambulatory Visit: Payer: Medicaid Other | Admitting: Family Medicine

## 2022-01-22 VITALS — BP 127/82 | HR 73 | Ht 61.0 in | Wt 177.5 lb

## 2022-01-22 DIAGNOSIS — G43711 Chronic migraine without aura, intractable, with status migrainosus: Secondary | ICD-10-CM

## 2022-01-22 MED ORDER — RIZATRIPTAN BENZOATE 10 MG PO TBDP: 10.0000 mg | ORAL_TABLET | ORAL | 11 refills | Status: DC | PRN

## 2022-01-22 MED ORDER — EMGALITY 120 MG/ML ~~LOC~~ SOAJ
120.0000 mg | SUBCUTANEOUS | 3 refills | Status: AC
Start: 2022-01-22 — End: ?

## 2022-01-22 NOTE — Patient Instructions (Signed)
Below is our plan:  We will continue Emgality every 30 days. Use rizatriptan as needed. Please take 1 tablet at onset of headache. May take 1 additional tablet in 2 hours if needed. Do not take more than 2 tablets in 24 hours or more than 10 in a month. For bad headaches, you can add Tyelnol up to 1000mg  and ibuprofen up to 800mg . You can also use ondansetron for nausea.   Please make sure you are staying well hydrated. I recommend 50-60 ounces daily. Well balanced diet and regular exercise encouraged. Consistent sleep schedule with 6-8 hours recommended.   Please continue follow up with care team as directed.   Follow up with me in 1 year   You may receive a survey regarding today's visit. I encourage you to leave honest feed back as I do use this information to improve patient care. Thank you for seeing me today!

## 2022-01-22 NOTE — Progress Notes (Signed)
Chief Complaint  Patient presents with   Follow-up    Pt in room #1 and alone. Pt here today for f/u on migraines.    HISTORY OF PRESENT ILLNESS:  01/22/22 ALL:  Sharon Garner is a 31 y.o. female here today for follow up for migraines. She was seen in consult with Dr Sharon Garner 08/2021 after ER visit for intractable migraine. She was started on Ajvoy but switched to [employer] per insurance preference. Rizatriptan and ondansetron used for abortive therapy. Since, she feels headaches are improving. Baseline having daily migrainous headaches, now having about 4-7 migraines a month. She used rizatriptan once last month but did not repeat it. She is not sure it helped much. She has not needed ondansetron. She had a tubal in 08/2020.    HISTORY (copied from Dr Sharon Garner previous note)  HPI:  Sharon Garner is a 31 y.o. female here as requested by Sharon Kaplan, MD for headaches. PMHx kidney stones, migraine, UTI, family history of intellectual disability, back pain in pregnancy.  I reviewed emergency room notes from August 19, 2021, Dr. Rhunette Garner who is the referring physician, she came in with a complaint of right-sided chest pain, right-sided arm numbness tingling, right-sided headache, dizziness and right-sided weakness.  She started having chest pain several days prior, sharp pain intermittent worse with movement and deep inspiration, she also started noticing tingling to the right side of her lower face and also some weakness in the right upper extremity, also started having some headaches on the right side prompting her to finally come to the emergency room, patient has not had a migraine in over a year, typically with her migraine she has photophobia which is not present right now, additionally she has never had paresthesias or chest pain with her headache, she was given a migraine cocktail in the emergency room including Compazine, ketorolac, Benadryl, fluids for her right-sided headache  with right-sided upper extremity paresthesias facial paresthesias.Patient presented to the emergency room April 29 of this year for a chief complaint of right-sided headache with right-sided upper extremity paresthesias and right-sided facial paresthesias, migraines have been well controlled, current headache much different than prior migraines, neuro exam was reassuring, cardiovascular exam was reassuring, no carotid bruits, differential included complex migraine stroke and other etiologies.  EKG showed no signs of ischemia or right-sided heart strain.  ACS is low in the differential.  Pulmonary exam was normal, no hypoxia or other signs, x-ray showed normal lung findings, headache cocktail and MRI of the brain was provided.  To follow-up with neurology for "complex migraines".   She has been having headaches for 13 years, after having her daughter. She started having bad headaches for a year with photophobia/phonophobia/osmophobia, she works in [employer] and has been vomiting, pulsating/punding/throbbing, this year she vomits, she feels hot and gets a red face, severe and painful she can't seven get off her bed. Hurst to move. A dar room helps. Cold helps. She has migraines every day. She has a paternal aunt with migraines. She saw someone in the past, she went to a doctor in [place] 6 years ago she was given a pill she was supposed, she is not having anymore children, not breastfeeding, lasting 24-48 hours, not worse with periods, occurs every day, worsening. Moderate to severe. Mainly on the right side unilateral and radiates down the neck with neck pain.No other focal neurologic deficits, associated symptoms, inciting events or modifiable factors.   Reviewed notes, labs and imaging from outside physicians, which  showed:   Personally reviewed MRI of the brain images which were normal.   Thorough review of records medications tried that can be used in migraine and headache management include:  Tylenol, Fioricet, Flexeril, Decadron injections, Advil, multiple Toradol injections, Reglan, naproxen, Zofran oral and injections, prednisone tablets, Compazine injections and tablets, Phenergan tablets and injections, propranolol extended release, scopolamine patches, Topiramate contraindicated due to kidney stones, she took a preventative in the past per review of records below from Neurology at [facility] she tried propranolol for 8 months, she also took TCA for > 16months per [facility] for 2 weeks with lumbar puncture and workup as reords reviewed below from prior neurology. She had tubal ligation, she is done, had it done on May 16th. Aimovig contraindicated due to constipation, imitrex,   REVIEW OF SYSTEMS: Out of a complete 14 system review of symptoms, the patient complains only of the following symptoms, headaches and all other reviewed systems are negative.   ALLERGIES: No Known Allergies   HOME MEDICATIONS: Outpatient Medications Prior to Visit  Medication Sig Dispense Refill   acetaminophen (TYLENOL) 325 MG tablet Take 650 mg by mouth every 6 (six) hours as needed for moderate pain.     ferrous sulfate 325 (65 FE) MG tablet Take 1 tablet (325 mg total) by mouth every other day. 30 tablet 2   ibuprofen (ADVIL) 600 MG tablet Take 1 tablet (600 mg total) by mouth every 6 (six) hours. 30 tablet 0   ondansetron (ZOFRAN-ODT) 4 MG disintegrating tablet Take 1-2 tablets (4-8 mg total) by mouth every 8 (eight) hours as needed. 30 tablet 3   Prenatal Vit-Fe Fumarate-FA (PRENATAL VITAMINS) 28-0.8 MG TABS Take 1 tablet by mouth daily. 30 tablet 6   Galcanezumab-gnlm (EMGALITY) 120 MG/ML SOAJ Inject 120 mg into the skin every 30 (thirty) days. 1 mL 5   rizatriptan (MAXALT-MLT) 10 MG disintegrating tablet Take 1 tablet (10 mg total) by mouth as needed for migraine. May repeat in 2 hours if needed 9 tablet 11   No facility-administered medications prior to visit.      PAST MEDICAL HISTORY: Past Medical History:  Diagnosis Date   Kidney stones    kidney stones removed 2021   Migraine    Ovarian cyst      PAST SURGICAL HISTORY: Past Surgical History:  Procedure Laterality Date   CESAREAN SECTION N/A 10/17/2012   Procedure: CESAREAN SECTION;  Surgeon: Tereso Newcomer, MD;  Location: [facility];  Service: Obstetrics;  Laterality: N/A;   CESAREAN SECTION N/A 05/11/2019   Procedure: CESAREAN SECTION;  Surgeon: Conan Bowens, MD;  Location: [facility];  Service: Obstetrics;  Laterality: N/A;   CESAREAN SECTION WITH BILATERAL TUBAL LIGATION N/A 09/05/2020   Procedure: CESAREAN SECTION WITH BILATERAL TUBAL LIGATION;  Surgeon: Warden Fillers, MD;  Location: [facility];  Service: Obstetrics;  Laterality: N/A;   KIDNEY STONE SURGERY  2021     FAMILY HISTORY: Family History  Problem Relation Age of Onset   Cancer Mother        reproductive   Migraines Paternal Aunt    Other Neg Hx    Alcohol abuse Neg Hx    Arthritis Neg Hx    Asthma Neg Hx    Birth defects Neg Hx    COPD Neg Hx    Depression Neg Hx    Diabetes Neg Hx    Drug abuse Neg Hx    Early death Neg Hx  Hearing loss Neg Hx    Heart disease Neg Hx    Hyperlipidemia Neg Hx    Hypertension Neg Hx    Kidney disease Neg Hx    Learning disabilities Neg Hx    Mental illness Neg Hx    Mental retardation Neg Hx    Miscarriages / Stillbirths Neg Hx    Stroke Neg Hx    Vision loss Neg Hx      SOCIAL HISTORY: Social History   Socioeconomic History   Marital status: Married    Spouse name: Not on file   Number of children: Not on file   Years of education: Not on file   Highest education level: Not on file  Occupational History   Not on file  Tobacco Use   Smoking status: Never   Smokeless tobacco: Never  Vaping Use   Vaping Use: Never used  Substance and Sexual Activity   Alcohol use: No   Drug use: No   Sexual activity: Not Currently    Birth control/protection: None   Other Topics Concern   Not on file  Social History Narrative   Not on file   Social Determinants of Health   Financial Resource Strain: Not on file  Food Insecurity: Not on file  Transportation Needs: Not on file  Physical Activity: Not on file  Stress: Not on file  Social Connections: Not on file  Intimate Partner Violence: Not on file     PHYSICAL EXAM  Vitals:   01/22/22 1512  BP: 127/82  Pulse: 73  Weight: 177 lb 8 oz (80.5 kg)  Height: 5\' 1"  (1.549 m)   Body mass index is 33.54 kg/m.  Generalized: Well developed, in no acute distress  Cardiology: normal rate and rhythm, no murmur auscultated  Respiratory: clear to auscultation bilaterally    Neurological examination  Mentation: Alert oriented to time, place, history taking. Follows all commands speech and language fluent Cranial nerve II-XII: Pupils were equal round reactive to light. Extraocular movements were full, visual field were full on confrontational test. Facial sensation and strength were normal. Uvula tongue midline. Head turning and shoulder shrug  were normal and symmetric. Motor: The motor testing reveals 5 over 5 strength of all 4 extremities. Good symmetric motor tone is noted throughout.  Sensory: Sensory testing is intact to soft touch on all 4 extremities. No evidence of extinction is noted.  Coordination: Cerebellar testing reveals good finger-nose-finger and heel-to-shin bilaterally.  Gait and station: Gait is normal. Tandem gait is normal. Romberg is negative. No drift is seen.  Reflexes: Deep tendon reflexes are symmetric and normal bilaterally.    DIAGNOSTIC DATA (LABS, IMAGING, TESTING) - I reviewed patient records, labs, notes, testing and imaging myself where available.  Lab Results  Component Value Date   WBC 7.3 08/19/2021   HGB 13.5 08/19/2021   HCT 40.8 08/19/2021   MCV 89.1 08/19/2021   PLT 372 08/19/2021      Component Value Date/Time   NA 138 08/19/2021 1422   K 3.8  08/19/2021 1422   CL 109 08/19/2021 1422   CO2 25 08/19/2021 1422   GLUCOSE 87 08/19/2021 1422   BUN 13 08/19/2021 1422   CREATININE 0.63 08/19/2021 1422   CALCIUM 8.6 (L) 08/19/2021 1422   PROT 8.0 08/19/2021 1422   ALBUMIN 4.2 08/19/2021 1422   AST 26 08/19/2021 1422   ALT 28 08/19/2021 1422   ALKPHOS 57 08/19/2021 1422   BILITOT 0.5 08/19/2021 1422   GFRNONAA >60  08/19/2021 1422   GFRAA >60 12/10/2019 1330   No results found for: "CHOL", "HDL", "LDLCALC", "LDLDIRECT", "TRIG", "CHOLHDL" Lab Results  Component Value Date   HGBA1C 5.6 05/04/2020   No results found for: "VITAMINB12" No results found for: "TSH"      No data to display               No data to display           ASSESSMENT AND PLAN  31 y.o. year old female  has a past medical history of Kidney stones, Migraine, and Ovarian cyst. here with    Chronic migraine without aura, with intractable migraine, so stated, with status migrainosus - Plan: rizatriptan (MAXALT-MLT) 10 MG disintegrating tablet  Sharon Garner reports doing well on Emgality. Baseline daily migraines, now having 4-7 per month. We will continue Emgality every 30 days and rizatriptan as needed. Healthy lifestyle habits encouraged. She was advised against pregnancy on Emgality. She reports having a tubal 08/2020. She will follow up with PCP as directed. She will return to see me in 1 year, sooner if needed. She verbalizes understanding and agreement with this plan.   No orders of the defined types were placed in this encounter.    Meds ordered this encounter  Medications   Galcanezumab-gnlm (EMGALITY) 120 MG/ML SOAJ    Sig: Inject 120 mg into the skin every 30 (thirty) days.    Dispense:  3 mL    Refill:  3    Order Specific Question:   Supervising Provider    Answer:   Anson Fret [id number]   rizatriptan (MAXALT-MLT) 10 MG disintegrating tablet    Sig: Take 1 tablet (10 mg total) by mouth as needed for migraine. May  repeat in 2 hours if needed    Dispense:  9 tablet    Refill:  11    Order Specific Question:   Supervising Provider    Answer:   Anson Fret [[id number]]     Shawnie Dapper, MSN, FNP-C 01/22/2022, 3:32 PM  [facility] [address], [address] [place], [place] [address] [phone]

## 2022-01-23 ENCOUNTER — Telehealth: Payer: Self-pay | Admitting: Neurology

## 2022-01-23 NOTE — Telephone Encounter (Signed)
"  Request Reference Number: [id number]. EMGALITY INJ 120MG /ML is approved through 01/24/2023. For further questions, call [employer] at [phone]."

## 2022-01-23 NOTE — Telephone Encounter (Signed)
PA completed for the patient on CMM/[employer]. KEY: [id number] Will await determination

## 2022-01-25 ENCOUNTER — Encounter: Payer: Self-pay | Admitting: *Deleted

## 2022-01-25 ENCOUNTER — Telehealth: Payer: Self-pay | Admitting: Family Medicine

## 2022-01-25 ENCOUNTER — Ambulatory Visit: Payer: Medicaid Other | Admitting: Family Medicine

## 2022-01-25 DIAGNOSIS — G43711 Chronic migraine without aura, intractable, with status migrainosus: Secondary | ICD-10-CM

## 2022-01-25 MED ORDER — NURTEC 75 MG PO TBDP: ORAL_TABLET | ORAL | 11 refills | Status: DC

## 2022-01-25 MED ORDER — METHYLPREDNISOLONE 4 MG PO TBPK: ORAL_TABLET | ORAL | 0 refills | Status: DC

## 2022-01-25 NOTE — Telephone Encounter (Signed)
Called pharmacy. They state PA needed for Emgality. Advised this was completed/approved on 01/23/22. They re-ran claim and got paid claim. They confirmed med in stock and will get ready for pt.  I called pt and informed her of above. She states she has had constant headache yesterday and today. Took 2 rizatriptan and 2 tylenol 500mg  at 10am and again at 4pm. Educated pt that she should only take a max of two rizatriptan in 24 hours. Explained risks of taking more than this. Advised she could cause medication overuse headaches. She states meds she has taken so far ineffective. I placed on hold and spoke with AL,NP. Per AL,NP, switch rizatriptan to Nurtec 75mg  (she has tried/failed rizatriptan/sumatriptan). Also call in medrol dose pak. I relayed this to the patient. She confirmed no hx od diabetes. Explained she should take Nurtec at onset on headache and can only take max of 1 tablet in 24 hour. Providing #16/30 days and should only take prn. She verbalized understanding.  Explained she should stop rizatriptan since Nurtec replacing this as her rescue med. She should not take these together. She verbalized understanding.  She also asked for work note since she missed yesterday and today d/t ongoing headache. She has been dizzy/sensitive to light d/t headache. Per AL,NP I relayed that she normally does not provide note w/o being seen but since she just had visit 01/22/22 ok to provide. I released letter to pt mychart per pt request.

## 2022-01-25 NOTE — Telephone Encounter (Signed)
Pt is calling. Stated she is having bad headaches and couldn't get her  Galcanezumab-gnlm (EMGALITY) 120 MG/ML SOAJ because the pharmacy is out. Pt is requesting a call back. Pt stated she have been out of work for two days due to headache.

## 2022-02-27 ENCOUNTER — Telehealth: Payer: Self-pay | Admitting: Neurology

## 2022-02-27 NOTE — Telephone Encounter (Signed)
PA completed on CMM/[employer]. [id number]

## 2022-02-28 NOTE — Telephone Encounter (Signed)
Received fax from [facility] community plan that PA approved from 02/27/22-02/28/23. Case ID: [id number]

## 2022-03-13 ENCOUNTER — Emergency Department (HOSPITAL_COMMUNITY)
Admission: EM | Admit: 2022-03-13 | Discharge: 2022-03-13 | Disposition: A | Discharge status: Home / Self Care | Payer: Medicaid Other | Attending: Emergency Medicine | Admitting: Emergency Medicine

## 2022-03-13 ENCOUNTER — Emergency Department (HOSPITAL_COMMUNITY): Payer: Medicaid Other

## 2022-03-13 DIAGNOSIS — R0789 Other chest pain: Secondary | ICD-10-CM

## 2022-03-13 DIAGNOSIS — R059 Cough, unspecified: Secondary | ICD-10-CM | POA: Diagnosis not present

## 2022-03-13 DIAGNOSIS — R0781 Pleurodynia: Secondary | ICD-10-CM | POA: Diagnosis not present

## 2022-03-13 DIAGNOSIS — Z20822 Contact with and (suspected) exposure to covid-19: Secondary | ICD-10-CM | POA: Insufficient documentation

## 2022-03-13 LAB — COMPREHENSIVE METABOLIC PANEL
ALT: 26 U/L (ref 0–44)
AST: 23 U/L (ref 15–41)
Albumin: 4.6 g/dL (ref 3.5–5.0)
Alkaline Phosphatase: 62 U/L (ref 38–126)
Anion gap: 7 (ref 5–15)
BUN: 10 mg/dL (ref 6–20)
CO2: 27 mmol/L (ref 22–32)
Calcium: 9.3 mg/dL (ref 8.9–10.3)
Chloride: 102 mmol/L (ref 98–111)
Creatinine, Ser: 0.75 mg/dL (ref 0.44–1.00)
GFR, Estimated: >60 mL/min (ref >60)
Glucose, Bld: 107 mg/dL — ABNORMAL HIGH (ref 70–99)
Potassium: 4 mmol/L (ref 3.5–5.1)
Sodium: 136 mmol/L (ref 135–145)
Total Bilirubin: 0.2 mg/dL — ABNORMAL LOW (ref 0.3–1.2)
Total Protein: 8.4 g/dL — ABNORMAL HIGH (ref 6.5–8.1)

## 2022-03-13 LAB — CBC WITH DIFFERENTIAL/PLATELET
Abs Immature Granulocytes: 0.01 10*3/uL (ref 0.00–0.07)
Basophils Absolute: 0.1 10*3/uL (ref 0.0–0.1)
Basophils Relative: 1 %
Eosinophils Absolute: 0.4 10*3/uL (ref 0.0–0.5)
Eosinophils Relative: 6 %
HCT: 43.7 % (ref 36.0–46.0)
Hemoglobin: 14.4 g/dL (ref 12.0–15.0)
Immature Granulocytes: 0 %
Lymphocytes Relative: 31 %
Lymphs Abs: 2.2 10*3/uL (ref 0.7–4.0)
MCH: 29.5 pg (ref 26.0–34.0)
MCHC: 33 g/dL (ref 30.0–36.0)
MCV: 89.5 fL (ref 80.0–100.0)
Monocytes Absolute: 0.6 10*3/uL (ref 0.1–1.0)
Monocytes Relative: 8 %
Neutro Abs: 4 10*3/uL (ref 1.7–7.7)
Neutrophils Relative %: 54 %
Platelets: 392 10*3/uL (ref 150–400)
RBC: 4.88 MIL/uL (ref 3.87–5.11)
RDW: 13.5 % (ref 11.5–15.5)
WBC: 7.3 10*3/uL (ref 4.0–10.5)
nRBC: 0 % (ref 0.0–0.2)

## 2022-03-13 LAB — PREGNANCY, URINE: Preg Test, Ur: NEGATIVE

## 2022-03-13 LAB — D-DIMER, QUANTITATIVE: D-Dimer, Quant: <0.27 ug/mL-FEU (ref 0.00–0.50)

## 2022-03-13 LAB — RESP PANEL BY RT-PCR (FLU A&B, COVID) ARPGX2
Influenza A by PCR: NEGATIVE
Influenza B by PCR: NEGATIVE
SARS Coronavirus 2 by RT PCR: NEGATIVE

## 2022-03-13 LAB — TROPONIN I (HIGH SENSITIVITY): Troponin I (High Sensitivity): <2 ng/L (ref <18)

## 2022-03-13 MED ORDER — IBUPROFEN 200 MG PO TABS
600.0000 mg | ORAL_TABLET | Freq: Once | ORAL | Status: AC
  Administered 2022-03-13: 600 mg via ORAL
  Filled 2022-03-13: qty 3

## 2022-03-13 MED ORDER — IBUPROFEN 600 MG PO TABS: 600.0000 mg | ORAL_TABLET | Freq: Four times a day (QID) | ORAL | 0 refills | Status: DC | PRN

## 2022-03-13 NOTE — ED Provider Notes (Signed)
[facility]-EMERGENCY DEPT Provider Note   CSN: [id number] Arrival date & time: 03/13/22  1243     History  Chief Complaint  Patient presents with   Cough    Sharon Garner is a 31 y.o. female.   Cough Associated symptoms: chest pain     31 year old female presents emergency department with complaints of chest pain.  Patient states that chest pain began this past Saturday.  Described as bilateral lower ribs.  Pain is worsened with cough as well as taking a deep breath.  Reports symptom onset with cough and nasal drainage 2 days ago both of which have ceased.  Reports residual chest pain.  Denies shortness of breath, fever, chills, night sweats, abdominal pain, nausea, vomiting.  Denies history of DVT/PE, recent surgery/immobilization, no malignancy.  Past medical history significant for nephrolithiasis, ovarian cyst  Home Medications Prior to Admission medications   Medication Sig Start Date End Date Taking? Authorizing Provider  acetaminophen (TYLENOL) 325 MG tablet Take 650 mg by mouth every 6 (six) hours as needed for moderate pain.    [provider]  ferrous sulfate 325 (65 FE) MG tablet Take 1 tablet (325 mg total) by mouth every other day. 09/07/20   Sheila Oats, MD  Galcanezumab-gnlm (EMGALITY) 120 MG/ML SOAJ Inject 120 mg into the skin every 30 (thirty) days. 01/22/22   Lomax, Amy, NP  ibuprofen (ADVIL) 600 MG tablet Take 1 tablet (600 mg total) by mouth every 6 (six) hours. 09/07/20   Sheila Oats, MD  methylPREDNISolone (MEDROL DOSEPAK) 4 MG TBPK tablet Take as directed 01/25/22   Lomax, Amy, NP  ondansetron (ZOFRAN-ODT) 4 MG disintegrating tablet Take 1-2 tablets (4-8 mg total) by mouth every 8 (eight) hours as needed. 09/19/21   Anson Fret, MD  Prenatal Vit-Fe Fumarate-FA (PRENATAL VITAMINS) 28-0.8 MG TABS Take 1 tablet by mouth daily. 05/04/20   Nugent, Odie Sera, NP  Rimegepant Sulfate (NURTEC) 75 MG TBDP Take 75 mg (1  tablet) by mouth as needed. The maximum dose in a 24-hour period is 75 mg (1 tablet). 01/25/22   Lomax, Amy, NP      Allergies    Patient has no known allergies.    Review of Systems   Review of Systems  Respiratory:  Positive for cough.   Cardiovascular:  Positive for chest pain.  All other systems reviewed and are negative.   Physical Exam Updated Vital Signs BP 112/70   Pulse 81   Temp 97.9 F (36.6 C)   Resp 18   Ht 5\' 1"  (1.549 m)   Wt 79.4 kg   LMP 03/12/2022   SpO2 99%   BMI 33.07 kg/m  Physical Exam Vitals and nursing note reviewed.  Constitutional:      General: She is not in acute distress.    Appearance: She is well-developed.  HENT:     Head: Normocephalic and atraumatic.  Eyes:     Conjunctiva/sclera: Conjunctivae normal.  Cardiovascular:     Rate and Rhythm: Normal rate and regular rhythm.     Heart sounds: No murmur heard. Pulmonary:     Effort: Pulmonary effort is normal. No respiratory distress.     Breath sounds: Normal breath sounds. No wheezing, rhonchi or rales.     Comments: Patient has bilateral lateral tenderness of lower ribs.  No overlying skin abnormalities noted. Abdominal:     Palpations: Abdomen is soft.     Tenderness: There is no abdominal tenderness.  Musculoskeletal:  General: No swelling.     Cervical back: Neck supple.     Right lower leg: No edema.     Left lower leg: No edema.  Skin:    General: Skin is warm and dry.     Capillary Refill: Capillary refill takes less than 2 seconds.  Neurological:     Mental Status: She is alert.  Psychiatric:        Mood and Affect: Mood normal.     ED Results / Procedures / Treatments   Labs (all labs ordered are listed, but only abnormal results are displayed) Labs Reviewed  COMPREHENSIVE METABOLIC PANEL - Abnormal; Notable for the following components:      Result Value   Glucose, Bld 107 (*)    Total Protein 8.4 (*)    Total Bilirubin 0.2 (*)    All other components  within normal limits  RESP PANEL BY RT-PCR (FLU A&B, COVID) ARPGX2  CBC WITH DIFFERENTIAL/PLATELET  D-DIMER, QUANTITATIVE  PREGNANCY, URINE  TROPONIN I (HIGH SENSITIVITY)  TROPONIN I (HIGH SENSITIVITY)    EKG None  Radiology DG Chest 1 View  Result Date: 03/13/2022 CLINICAL DATA:  Coughing since yesterday, BILATERAL rib pain for 3 days, chest pain EXAM: CHEST  1 VIEW COMPARISON:  Portable exam 1437 hours compared to 08/18/2021 FINDINGS: Normal heart size, mediastinal contours, and pulmonary vascularity. Lungs clear. No pleural effusion or pneumothorax. Bones unremarkable. IMPRESSION: No acute abnormalities. Electronically Signed   By: Ulyses Southward M.D.   On: 03/13/2022 14:52    Procedures Procedures    Medications Ordered in ED Medications  ibuprofen (ADVIL) tablet 600 mg (600 mg Oral Given 03/13/22 1502)    ED Course/ Medical Decision Making/ A&P                           Medical Decision Making Amount and/or Complexity of Data Reviewed Labs: ordered. Radiology: ordered.  Risk OTC drugs.   This patient presents to the ED for concern of chest pain, this involves an extensive number of treatment options, and is a complaint that carries with it a high risk of complications and morbidity.  The differential diagnosis includes ACS, tamponade, pericarditis/myocarditis, aortic dissection, pulmonary embolism, pneumothorax, pneumonia, cardiomyopathy, MSK, costochondritis, thoracic outlet syndrome, GERD  Co morbidities that complicate the patient evaluation  See HPI   Additional history obtained:  Additional history obtained from EMR External records from outside source obtained and reviewed including hospital records   Lab Tests:  I Ordered, and personally interpreted labs.  The pertinent results include: No leukocytosis noted.  No evidence anemia.  Platelets within range.  No electrolyte abnormalities noted.  Renal function within normal limits.  No transaminitis noted.   Initial troponin less than 2, given duration of patient's symptoms, second troponin deemed unnecessary due to unlikelihood of ACS; EKG showed sinus rhythm without signs of acute ischemic changes.  Respiratory viral panel negative.  Urine pregnancy negative.   Imaging Studies ordered:  I ordered imaging studies including chest x-ray I independently visualized and interpreted imaging which showed no acute cardiopulmonary process I agree with the radiologist interpretation   Cardiac Monitoring: / EKG:  The patient was maintained on a cardiac monitor.  I personally viewed and interpreted the cardiac monitored which showed an underlying rhythm of: Sinus rhythm without acute ischemic changes   Consultations Obtained:  N/a   Problem List / ED Course / Critical interventions / Medication management  Chest pain I ordered  medication including ibuprofen for pain   Reevaluation of the patient after these medicines showed that the patient improved I have reviewed the patients home medicines and have made adjustments as needed   Social Determinants of Health:  Denies tobacco, licit drug use   Test / Admission - Considered:  Chest pain Vitals signs  within normal range and stable throughout visit. Laboratory/imaging studies significant for: See above Doubt ACS given history of present illness, negative troponin and reassuring EKG.  Doubt myocarditis/pericarditis.  Doubt aortic dissection.  Doubt pneumothorax/pneumonia.  Doubt intra-abdominal pathology.  Doubt PE given bilateral nature of pleuritic type chest pain with negative D-dimer.  Patient does not have risk factors for PE/DVT.  Given reproducibility with palpation, likely MSK pain.  We will treat with NSAIDs outpatient.  Follow-up with PCP recommended 3 to 5 days.  Treatment plan discussed with patient she acknowledged understand was agreeable to said plan. Worrisome signs and symptoms were discussed with the patient, and the patient  acknowledged understanding to return to the ED if noticed. Patient was stable upon discharge.          Final Clinical Impression(s) / ED Diagnoses Final diagnoses:  Chest wall pain    Rx / DC Orders ED Discharge Orders     None         Peter Garter, [place] 03/13/22 1638    Maia Plan, MD 03/21/22 2044

## 2022-03-13 NOTE — Discharge Instructions (Addendum)
Note the work-up today was over all reassuring.  As discussed, continue take NSAIDs such as ibuprofen/Aleve for your symptoms.  Recommend follow-up with primary care in 3 to 5 days for reevaluation of your symptoms.  Please not hesitate to return to emergency department if the worrisome signs and symptoms we discussed become apparent.

## 2022-03-13 NOTE — ED Triage Notes (Signed)
Pt states she started coughing yesterday. Pt reports bilateral rib pain that started 3 days ago. Denies injury

## 2022-03-13 NOTE — ED Provider Triage Note (Signed)
Emergency Medicine Provider Triage Evaluation Note  Sharon Garner , a 31 y.o. female  was evaluated in triage.  Pt complains of chest pain.  Patient states that chest pain began this past Saturday.  Described as bilateral lower ribs.  Pain is worsened with cough as well as taking a deep breath.  Reports symptom onset with cough and nasal drainage 2 days ago both of which have ceased.  Reports residual chest pain.  Denies shortness of breath, fever, chills, night sweats, abdominal pain, nausea, vomiting.  Denies history of DVT/PE, recent surgery/immobilization, no malignancy..  Review of Systems  Positive: See above Negative:   Physical Exam  BP 120/73   Pulse 81   Temp 97.9 F (36.6 C)   Resp 18   Ht 5\' 1"  (1.549 m)   Wt 79.4 kg   LMP 03/12/2022   SpO2 99%   BMI 33.07 kg/m  Gen:   Awake, no distress   Resp:  Normal effort  MSK:   Moves extremities without difficulty  Other:  No abdominal tenderness.  No lower extremity edema noted.  Tenderness palpation of lateral aspects of bilateral lower ribs.  Pain elicited with taking a deep breath.  Lungs clear to auscultation bilaterally.  No obvious murmurs gallops or rubs.  Medical Decision Making  Medically screening exam initiated at 2:07 PM.  Appropriate orders placed.  Beyonca Vergara-Cerros was informed that the remainder of the evaluation will be completed by another provider, this initial triage assessment does not replace that evaluation, and the importance of remaining in the ED until their evaluation is complete.    03/14/2022, Peter Garter 03/13/22 1415

## 2022-05-16 ENCOUNTER — Encounter: Payer: Self-pay | Admitting: Family Medicine

## 2022-05-16 ENCOUNTER — Telehealth: Payer: Self-pay | Admitting: Family Medicine

## 2022-05-16 NOTE — Telephone Encounter (Signed)
Pt is calling. Stated she has had a really bad headache for the last three days and was not able to go to work today. Pt said medication is not working and she is requesting a call back from the nurse.

## 2022-05-16 NOTE — Telephone Encounter (Signed)
Called pt.  She has tried taking Nurtec yesterday and this morning around 5am.  Has also tried tylenol but ineffective. Head is pounding/feeling dizzy/throwing up. Does not feel it is r/t infection/illness. Trying to rest/keep eyes closed right now.   States she has not gotten Emgality inj this month. Was due first week of January for inj. She spoke with pharmacy a couple weeks ago who states its on backorder, waiting for it to come in stock. Recommended she call them to get update on this.   Medrol dos pak ineffective in the past. Husband tried to take her to ER last night but she did not want to go.   Aware I will send to Amy Lomax,NP to review and will call back w/ recommendation.

## 2022-05-16 NOTE — Telephone Encounter (Signed)
Called pt. She would like to come for migraine infusion. She will come at 3pm and will have driver. Gave signed order to intrafusion.

## 2022-05-17 ENCOUNTER — Encounter: Payer: Self-pay | Admitting: Family Medicine

## 2022-05-20 ENCOUNTER — Other Ambulatory Visit: Payer: Self-pay

## 2022-05-20 ENCOUNTER — Emergency Department (HOSPITAL_COMMUNITY)
Admission: EM | Admit: 2022-05-20 | Discharge: 2022-05-21 | Discharge status: Against Medical Advice | Payer: Medicaid Other | Attending: Physician Assistant | Admitting: Physician Assistant

## 2022-05-20 DIAGNOSIS — U071 COVID-19: Secondary | ICD-10-CM | POA: Diagnosis not present

## 2022-05-20 DIAGNOSIS — R519 Headache, unspecified: Secondary | ICD-10-CM | POA: Diagnosis not present

## 2022-05-20 DIAGNOSIS — Z5321 Procedure and treatment not carried out due to patient leaving prior to being seen by health care provider: Secondary | ICD-10-CM | POA: Diagnosis not present

## 2022-05-20 DIAGNOSIS — H938X9 Other specified disorders of ear, unspecified ear: Secondary | ICD-10-CM | POA: Diagnosis not present

## 2022-05-20 DIAGNOSIS — M791 Myalgia, unspecified site: Secondary | ICD-10-CM | POA: Diagnosis not present

## 2022-05-20 LAB — RESP PANEL BY RT-PCR (RSV, FLU A&B, COVID)  RVPGX2
Influenza A by PCR: NEGATIVE
Influenza B by PCR: NEGATIVE
Resp Syncytial Virus by PCR: NEGATIVE
SARS Coronavirus 2 by RT PCR: POSITIVE — AB

## 2022-05-20 NOTE — ED Triage Notes (Signed)
Patient reports headache with generalized body aches , sore throat and bilateral ear ache.

## 2022-05-20 NOTE — ED Provider Triage Note (Signed)
Emergency Medicine Provider Triage Evaluation Note  Sharon Garner , a 32 y.o. female  was evaluated in triage.  Pt complains of a headache.  Pt recently treated for a migraine.  Pt complains f ear pressure and nasal congestion   Review of Systems  Positive: Congestion  Negative: fever  Physical Exam  BP 122/82 (BP Location: Right Arm)   Pulse 80   Temp 98.6 F (37 C) (Oral)   Resp 17   LMP 05/05/2022   SpO2 99%  Gen:   Awake, no distress   Resp:  Normal effort  MSK:   Moves extremities without difficulty  Other:    Medical Decision Making  Medically screening exam initiated at 7:44 PM.  Appropriate orders placed.  Alishba Vergara-Cerros was informed that the remainder of the evaluation will be completed by another provider, this initial triage assessment does not replace that evaluation, and the importance of remaining in the ED until their evaluation is complete.     Fransico Meadow, [place] 05/20/22 1945

## 2022-05-21 NOTE — ED Notes (Signed)
Called for patient 3 times no answer. 

## 2022-05-30 DIAGNOSIS — Z23 Encounter for immunization: Secondary | ICD-10-CM | POA: Diagnosis not present

## 2022-05-30 DIAGNOSIS — N644 Mastodynia: Secondary | ICD-10-CM | POA: Diagnosis not present

## 2022-05-30 DIAGNOSIS — Z1329 Encounter for screening for other suspected endocrine disorder: Secondary | ICD-10-CM | POA: Diagnosis not present

## 2022-06-07 ENCOUNTER — Other Ambulatory Visit: Payer: Self-pay | Admitting: Student

## 2022-06-07 DIAGNOSIS — N644 Mastodynia: Secondary | ICD-10-CM

## 2022-06-21 ENCOUNTER — Other Ambulatory Visit: Payer: Medicaid Other

## 2022-08-08 ENCOUNTER — Ambulatory Visit
Admission: RE | Admit: 2022-08-08 | Discharge: 2022-08-08 | Disposition: A | Discharge status: Home / Self Care | Payer: Medicaid Other | Source: Ambulatory Visit | Attending: Student | Admitting: Student

## 2022-08-08 ENCOUNTER — Ambulatory Visit: Payer: Medicaid Other

## 2022-08-08 DIAGNOSIS — N6489 Other specified disorders of breast: Secondary | ICD-10-CM | POA: Diagnosis not present

## 2022-08-08 DIAGNOSIS — N644 Mastodynia: Secondary | ICD-10-CM

## 2022-08-08 DIAGNOSIS — R928 Other abnormal and inconclusive findings on diagnostic imaging of breast: Secondary | ICD-10-CM | POA: Diagnosis not present

## 2023-01-09 ENCOUNTER — Telehealth: Payer: Self-pay

## 2023-01-09 NOTE — Telephone Encounter (Signed)
*  [facility]  Pharmacy Patient Advocate Encounter   Received notification from CoverMyMeds that prior authorization for Emgality 120MG /ML auto-injectors (migraine)  is required/requested.   Insurance verification completed.   The patient is insured through [facility] .   Per test claim: PA required; PA submitted to [facility] via CoverMyMeds Key/confirmation #/EOC [id number] Status is pending

## 2023-01-16 ENCOUNTER — Other Ambulatory Visit (HOSPITAL_COMMUNITY): Payer: Self-pay

## 2023-01-16 NOTE — Telephone Encounter (Signed)
Pharmacy Patient Advocate Encounter  Received notification from [facility] MEDICAID that Prior Authorization for Emgality 120MG /ML auto-injectors (migraine) has been APPROVED from 01/15/2023 to 01/15/2024   PA #/Case ID/Reference #: [id number]

## 2023-01-21 NOTE — Progress Notes (Deleted)
No chief complaint on file.   HISTORY OF PRESENT ILLNESS:  01/21/23 ALL:  Sharon Garner returns for follow up for migraines. She was last seen 01/2022 and doing well on Emgality and rizatriptan. Since,   01/22/2022 ALL: Sharon Garner is a 32 y.o. female here today for follow up for migraines. She was seen in consult with Dr Lucia Gaskins 08/2021 after ER visit for intractable migraine. She was started on Ajvoy but switched to [employer] per insurance preference. Rizatriptan and ondansetron used for abortive therapy. Since, she feels headaches are improving. Baseline having daily migrainous headaches, now having about 4-7 migraines a month. She used rizatriptan once last month but did not repeat it. She is not sure it helped much. She has not needed ondansetron. She had a tubal in 08/2020.    HISTORY (copied from Dr Trevor Mace previous note)  HPI:  Sharon Garner is a 32 y.o. female here as requested by [name], MD for headaches. PMHx kidney stones, migraine, UTI, family history of intellectual disability, back pain in pregnancy.  I reviewed emergency room notes from August 19, 2021, Dr. Rhunette Croft who is the referring physician, she came in with a complaint of right-sided chest pain, right-sided arm numbness tingling, right-sided headache, dizziness and right-sided weakness.  She started having chest pain several days prior, sharp pain intermittent worse with movement and deep inspiration, she also started noticing tingling to the right side of her lower face and also some weakness in the right upper extremity, also started having some headaches on the right side prompting her to finally come to the emergency room, patient has not had a migraine in over a year, typically with her migraine she has photophobia which is not present right now, additionally she has never had paresthesias or chest pain with her headache, she was given a migraine cocktail in the emergency room including Compazine,  ketorolac, Benadryl, fluids for her right-sided headache with right-sided upper extremity paresthesias facial paresthesias.Patient presented to the emergency room April 29 of this year for a chief complaint of right-sided headache with right-sided upper extremity paresthesias and right-sided facial paresthesias, migraines have been well controlled, current headache much different than prior migraines, neuro exam was reassuring, cardiovascular exam was reassuring, no carotid bruits, differential included complex migraine stroke and other etiologies.  EKG showed no signs of ischemia or right-sided heart strain.  ACS is low in the differential.  Pulmonary exam was normal, no hypoxia or other signs, x-ray showed normal lung findings, headache cocktail and MRI of the brain was provided.  To follow-up with neurology for "complex migraines".   She has been having headaches for 13 years, after having her daughter. She started having bad headaches for a year with photophobia/phonophobia/osmophobia, she works in [employer] and has been vomiting, pulsating/punding/throbbing, this year she vomits, she feels hot and gets a red face, severe and painful she can't seven get off her bed. Hurst to move. A dar room helps. Cold helps. She has migraines every day. She has a paternal aunt with migraines. She saw someone in the past, she went to a doctor in [place] 6 years ago she was given a pill she was supposed, she is not having anymore children, not breastfeeding, lasting 24-48 hours, not worse with periods, occurs every day, worsening. Moderate to severe. Mainly on the right side unilateral and radiates down the neck with neck pain.No other focal neurologic deficits, associated symptoms, inciting events or modifiable factors.   Reviewed notes, labs and imaging from  outside physicians, which showed:   Personally reviewed MRI of the brain images which were normal.   Thorough review of records medications tried that can  be used in migraine and headache management include: Tylenol, Fioricet, Flexeril, Decadron injections, Advil, multiple Toradol injections, Reglan, naproxen, Zofran oral and injections, prednisone tablets, Compazine injections and tablets, Phenergan tablets and injections, propranolol extended release, scopolamine patches, Topiramate contraindicated due to kidney stones, she took a preventative in the past per review of records below from [facility]rology at [facility] she tried propranolol for 8 months, she also took TCA for > 6months per records [facility] for 2 weeks with lumbar puncture and workup as reords reviewed below from prior neurology. She had tubal ligation, she is done, had it done on May 16th. Aimovig contraindicated due to constipation, imitrex,   REVIEW OF SYSTEMS: Out of a complete 14 system review of symptoms, the patient complains only of the following symptoms, headaches and all other reviewed systems are negative.   ALLERGIES: No Known Allergies   HOME MEDICATIONS: Outpatient Medications Prior to Visit  Medication Sig Dispense Refill   acetaminophen (TYLENOL) 325 MG tablet Take 650 mg by mouth every 6 (six) hours as needed for moderate pain.     ferrous sulfate 325 (65 FE) MG tablet Take 1 tablet (325 mg total) by mouth every other day. 30 tablet 2   Galcanezumab-gnlm (EMGALITY) 120 MG/ML SOAJ Inject 120 mg into the skin every 30 (thirty) days. 3 mL 3   ibuprofen (ADVIL) 600 MG tablet Take 1 tablet (600 mg total) by mouth every 6 (six) hours as needed. 30 tablet 0   methylPREDNISolone (MEDROL DOSEPAK) 4 MG TBPK tablet Take as directed 21 tablet 0   ondansetron (ZOFRAN-ODT) 4 MG disintegrating tablet Take 1-2 tablets (4-8 mg total) by mouth every 8 (eight) hours as needed. 30 tablet 3   Prenatal Vit-Fe Fumarate-FA (PRENATAL VITAMINS) 28-0.8 MG TABS Take 1 tablet by mouth daily. 30 tablet 6   Rimegepant Sulfate (NURTEC) 75 MG TBDP Take 75 mg (1 tablet) by mouth as  needed. The maximum dose in a 24-hour period is 75 mg (1 tablet). 16 tablet 11   No facility-administered medications prior to visit.     PAST MEDICAL HISTORY: Past Medical History:  Diagnosis Date   Kidney stones    kidney stones removed 2021   Migraine    Ovarian cyst      PAST SURGICAL HISTORY: Past Surgical History:  Procedure Laterality Date   CESAREAN SECTION N/A 10/17/2012   Procedure: CESAREAN SECTION;  Surgeon: Tereso Newcomer, MD;  Location: [facility];  Service: Obstetrics;  Laterality: N/A;   CESAREAN SECTION N/A 05/11/2019   Procedure: CESAREAN SECTION;  Surgeon: Conan Bowens, MD;  Location: [facility] OR[facility];  Service: Obstetrics;  Laterality: N/A;   CESAREAN SECTION WITH BILATERAL TUBAL LIGATION N/A 09/05/2020   Procedure: CESAREAN SECTION WITH BILATERAL TUBAL LIGATION;  Surgeon: Warden Fillers, MD;  Location: [facility] OR[facility];  Service: Obstetrics;  Laterality: N/A;   KIDNEY STONE SURGERY  2021     FAMILY HISTORY: Family History  Problem Relation Age of Onset   Cancer Mother        reproductive   Migraines Paternal Aunt    Other Neg Hx    Alcohol abuse Neg Hx    Arthritis Neg Hx    Asthma Neg Hx    Birth defects Neg Hx    COPD Neg Hx    Depression Neg  Hx    Diabetes Neg Hx    Drug abuse Neg Hx    Early death Neg Hx    Hearing loss Neg Hx    Heart disease Neg Hx    Hyperlipidemia Neg Hx    Hypertension Neg Hx    Kidney disease Neg Hx    Learning disabilities Neg Hx    Mental illness Neg Hx    Mental retardation Neg Hx    Miscarriages / Stillbirths Neg Hx    Stroke Neg Hx    Vision loss Neg Hx      SOCIAL HISTORY: Social History   Socioeconomic History   Marital status: Married    Spouse name: Not on file   Number of children: Not on file   Years of education: Not on file   Highest education level: Not on file  Occupational History   Not on file  Tobacco Use   Smoking status: Never   Smokeless tobacco: Never  Vaping Use   Vaping status: Never  Used  Substance and Sexual Activity   Alcohol use: No   Drug use: No   Sexual activity: Not Currently    Birth control/protection: None  Other Topics Concern   Not on file  Social History Narrative   Not on file   Social Determinants of Health   Financial Resource Strain: Not on file  Food Insecurity: Not on file  Transportation Needs: Not on file  Physical Activity: Not on file  Stress: Not on file  Social Connections: Not on file  Intimate Partner Violence: Not on file     PHYSICAL EXAM  There were no vitals filed for this visit.  There is no height or weight on file to calculate BMI.  Generalized: Well developed, in no acute distress  Cardiology: normal rate and rhythm, no murmur auscultated  Respiratory: clear to auscultation bilaterally    Neurological examination  Mentation: Alert oriented to time, place, history taking. Follows all commands speech and language fluent Cranial nerve II-XII: Pupils were equal round reactive to light. Extraocular movements were full, visual field were full on confrontational test. Facial sensation and strength were normal. Uvula tongue midline. Head turning and shoulder shrug  were normal and symmetric. Motor: The motor testing reveals 5 over 5 strength of all 4 extremities. Good symmetric motor tone is noted throughout.  Sensory: Sensory testing is intact to soft touch on all 4 extremities. No evidence of extinction is noted.  Coordination: Cerebellar testing reveals good finger-nose-finger and heel-to-shin bilaterally.  Gait and station: Gait is normal. Tandem gait is normal. Romberg is negative. No drift is seen.  Reflexes: Deep tendon reflexes are symmetric and normal bilaterally.    DIAGNOSTIC DATA (LABS, IMAGING, TESTING) - I reviewed patient records, labs, notes, testing and imaging myself where available.  Lab Results  Component Value Date   WBC 7.3 03/13/2022   HGB 14.4 03/13/2022   HCT 43.7 03/13/2022   MCV 89.5  03/13/2022   PLT 392 03/13/2022      Component Value Date/Time   NA 136 03/13/2022 1517   K 4.0 03/13/2022 1517   CL 102 03/13/2022 1517   CO2 27 03/13/2022 1517   GLUCOSE 107 (H) 03/13/2022 1517   BUN 10 03/13/2022 1517   CREATININE 0.75 03/13/2022 1517   CALCIUM 9.3 03/13/2022 1517   PROT 8.4 (H) 03/13/2022 1517   ALBUMIN 4.6 03/13/2022 1517   AST 23 03/13/2022 1517   ALT 26 03/13/2022 1517   ALKPHOS 62  03/13/2022 1517   BILITOT 0.2 (L) 03/13/2022 1517   GFRNONAA >60 03/13/2022 1517   GFRAA >60 12/10/2019 1330   No results found for: "CHOL", "HDL", "LDLCALC", "LDLDIRECT", "TRIG", "CHOLHDL" Lab Results  Component Value Date   HGBA1C 5.6 05/04/2020   No results found for: "VITAMINB12" No results found for: "TSH"      No data to display               No data to display           ASSESSMENT AND PLAN  32 y.o. year old female  has a past medical history of Kidney stones, Migraine, and Ovarian cyst. here with    No diagnosis found.  Sharon Garner reports doing well on [employer]. Baseline daily migraines, now having 4-7 per month. We will continue Emgality every 30 days and rizatriptan as needed. Healthy lifestyle habits encouraged. She was advised against pregnancy on Emgality. She reports having a tubal 08/2020. She will follow up with PCP as directed. She will return to see me in 1 year, sooner if needed. She verbalizes understanding and agreement with this plan.   No orders of the defined types were placed in this encounter.    No orders of the defined types were placed in this encounter.    Shawnie Dapper, MSN, FNP-C 01/21/2023, 4:22 PM  [facility] [address], [address] [place], [place] [address] [phone]

## 2023-01-23 ENCOUNTER — Ambulatory Visit: Payer: Medicaid Other | Admitting: Family Medicine

## 2023-01-23 ENCOUNTER — Encounter: Payer: Self-pay | Admitting: Family Medicine

## 2023-01-23 DIAGNOSIS — G43711 Chronic migraine without aura, intractable, with status migrainosus: Secondary | ICD-10-CM

## 2023-01-24 ENCOUNTER — Other Ambulatory Visit: Payer: Self-pay | Admitting: Family Medicine

## 2023-01-24 ENCOUNTER — Encounter: Payer: Self-pay | Admitting: Family Medicine

## 2023-01-24 DIAGNOSIS — G43711 Chronic migraine without aura, intractable, with status migrainosus: Secondary | ICD-10-CM

## 2023-01-24 MED ORDER — NURTEC 75 MG PO TBDP: ORAL_TABLET | ORAL | 0 refills | Status: DC

## 2023-02-06 NOTE — Progress Notes (Deleted)
No chief complaint on file.   HISTORY OF PRESENT ILLNESS:  02/06/23 ALL:  Sharon Garner returns for follow up for migraines. She was last seen 01/2022 and doing well on Emgality and rizatriptan. We switched rizatriptan to Nurtec 01/2022 as rizatriptan was not as effective. Since,   01/22/2022 ALL: Sharon Garner is a 32 y.o. female here today for follow up for migraines. She was seen in consult with Dr Lucia Gaskins 08/2021 after ER visit for intractable migraine. She was started on Ajvoy but switched to [employer] per insurance preference. Rizatriptan and ondansetron used for abortive therapy. Since, she feels headaches are improving. Baseline having daily migrainous headaches, now having about 4-7 migraines a month. She used rizatriptan once last month but did not repeat it. She is not sure it helped much. She has not needed ondansetron. She had a tubal in 08/2020.    HISTORY (copied from Dr Trevor Mace previous note)  HPI:  Sharon Garner is a 32 y.o. female here as requested by [name], MD for headaches. PMHx kidney stones, migraine, UTI, family history of intellectual disability, back pain in pregnancy.  I reviewed emergency room notes from August 19, 2021, Dr. Rhunette Croft who is the referring physician, she came in with a complaint of right-sided chest pain, right-sided arm numbness tingling, right-sided headache, dizziness and right-sided weakness.  She started having chest pain several days prior, sharp pain intermittent worse with movement and deep inspiration, she also started noticing tingling to the right side of her lower face and also some weakness in the right upper extremity, also started having some headaches on the right side prompting her to finally come to the emergency room, patient has not had a migraine in over a year, typically with her migraine she has photophobia which is not present right now, additionally she has never had paresthesias or chest pain with her headache, she  was given a migraine cocktail in the emergency room including Compazine, ketorolac, Benadryl, fluids for her right-sided headache with right-sided upper extremity paresthesias facial paresthesias.Patient presented to the emergency room April 29 of this year for a chief complaint of right-sided headache with right-sided upper extremity paresthesias and right-sided facial paresthesias, migraines have been well controlled, current headache much different than prior migraines, neuro exam was reassuring, cardiovascular exam was reassuring, no carotid bruits, differential included complex migraine stroke and other etiologies.  EKG showed no signs of ischemia or right-sided heart strain.  ACS is low in the differential.  Pulmonary exam was normal, no hypoxia or other signs, x-ray showed normal lung findings, headache cocktail and MRI of the brain was provided.  To follow-up with neurology for "complex migraines".   She has been having headaches for 13 years, after having her daughter. She started having bad headaches for a year with photophobia/phonophobia/osmophobia, she works in [employer] and has been vomiting, pulsating/punding/throbbing, this year she vomits, she feels hot and gets a red face, severe and painful she can't seven get off her bed. Hurst to move. A dar room helps. Cold helps. She has migraines every day. She has a paternal aunt with migraines. She saw someone in the past, she went to a doctor in [place] 6 years ago she was given a pill she was supposed, she is not having anymore children, not breastfeeding, lasting 24-48 hours, not worse with periods, occurs every day, worsening. Moderate to severe. Mainly on the right side unilateral and radiates down the neck with neck pain.No other focal neurologic deficits, associated symptoms, inciting  events or modifiable factors.   Reviewed notes, labs and imaging from outside physicians, which showed:   Personally reviewed MRI of the brain images  which were normal.   Thorough review of records medications tried that can be used in migraine and headache management include: Tylenol, Fioricet, Flexeril, Decadron injections, Advil, multiple Toradol injections, Reglan, naproxen, Zofran oral and injections, prednisone tablets, Compazine injections and tablets, Phenergan tablets and injections, propranolol extended release, scopolamine patches, Topiramate contraindicated due to kidney stones, she took a preventative in the past per review of records below from [facility]rology [facility] she tried propranolol for 8 months, she also took TCA for > 6months [facility] for 2 weeks with lumbar puncture and workup as reords reviewed below from prior neurology. She had tubal ligation, she is done, had it done on May 16th. Aimovig contraindicated due to constipation, imitrex,   REVIEW OF SYSTEMS: Out of a complete 14 system review of symptoms, the patient complains only of the following symptoms, headaches and all other reviewed systems are negative.   ALLERGIES: No Known Allergies   HOME MEDICATIONS: Outpatient Medications Prior to Visit  Medication Sig Dispense Refill   acetaminophen (TYLENOL) 325 MG tablet Take 650 mg by mouth every 6 (six) hours as needed for moderate pain.     ferrous sulfate 325 (65 FE) MG tablet Take 1 tablet (325 mg total) by mouth every other day. 30 tablet 2   Galcanezumab-gnlm (EMGALITY) 120 MG/ML SOAJ Inject 120 mg into the skin every 30 (thirty) days. 3 mL 3   ibuprofen (ADVIL) 600 MG tablet Take 1 tablet (600 mg total) by mouth every 6 (six) hours as needed. 30 tablet 0   methylPREDNISolone (MEDROL DOSEPAK) 4 MG TBPK tablet Take as directed 21 tablet 0   ondansetron (ZOFRAN-ODT) 4 MG disintegrating tablet Take 1-2 tablets (4-8 mg total) by mouth every 8 (eight) hours as needed. 30 tablet 3   Prenatal Vit-Fe Fumarate-FA (PRENATAL VITAMINS) 28-0.8 MG TABS Take 1 tablet by mouth daily. 30 tablet 6    Rimegepant Sulfate (NURTEC) 75 MG TBDP Take 75 mg (1 tablet) by mouth as needed. The maximum dose in a 24-hour period is 75 mg (1 tablet). 8 tablet 0   No facility-administered medications prior to visit.     PAST MEDICAL HISTORY: Past Medical History:  Diagnosis Date   Kidney stones    kidney stones removed 2021   Migraine    Ovarian cyst      PAST SURGICAL HISTORY: Past Surgical History:  Procedure Laterality Date   CESAREAN SECTION N/A 10/17/2012   Procedure: CESAREAN SECTION;  Surgeon: Tereso Newcomer, MD;  Location: [facility];  Service: Obstetrics;  Laterality: N/A;   CESAREAN SECTION N/A 05/11/2019   Procedure: CESAREAN SECTION;  Surgeon: Conan Bowens, MD;  Location: [facility];  Service: Obstetrics;  Laterality: N/A;   CESAREAN SECTION WITH BILATERAL TUBAL LIGATION N/A 09/05/2020   Procedure: CESAREAN SECTION WITH BILATERAL TUBAL LIGATION;  Surgeon: Warden Fillers, MD;  Location: [facility];  Service: Obstetrics;  Laterality: N/A;   KIDNEY STONE SURGERY  2021     FAMILY HISTORY: Family History  Problem Relation Age of Onset   Cancer Mother        reproductive   Migraines Paternal Aunt    Other Neg Hx    Alcohol abuse Neg Hx    Arthritis Neg Hx    Asthma Neg Hx    Birth defects Neg  Hx    COPD Neg Hx    Depression Neg Hx    Diabetes Neg Hx    Drug abuse Neg Hx    Early death Neg Hx    Hearing loss Neg Hx    Heart disease Neg Hx    Hyperlipidemia Neg Hx    Hypertension Neg Hx    Kidney disease Neg Hx    Learning disabilities Neg Hx    Mental illness Neg Hx    Mental retardation Neg Hx    Miscarriages / Stillbirths Neg Hx    Stroke Neg Hx    Vision loss Neg Hx      SOCIAL HISTORY: Social History   Socioeconomic History   Marital status: Married    Spouse name: Not on file   Number of children: Not on file   Years of education: Not on file   Highest education level: Not on file  Occupational History   Not on file  Tobacco Use   Smoking status:  Never   Smokeless tobacco: Never  Vaping Use   Vaping status: Never Used  Substance and Sexual Activity   Alcohol use: No   Drug use: No   Sexual activity: Not Currently    Birth control/protection: None  Other Topics Concern   Not on file  Social History Narrative   Not on file   Social Determinants of Health   Financial Resource Strain: Not on file  Food Insecurity: Not on file  Transportation Needs: Not on file  Physical Activity: Not on file  Stress: Not on file  Social Connections: Not on file  Intimate Partner Violence: Not on file     PHYSICAL EXAM  There were no vitals filed for this visit.  There is no height or weight on file to calculate BMI.  Generalized: Well developed, in no acute distress  Cardiology: normal rate and rhythm, no murmur auscultated  Respiratory: clear to auscultation bilaterally    Neurological examination  Mentation: Alert oriented to time, place, history taking. Follows all commands speech and language fluent Cranial nerve II-XII: Pupils were equal round reactive to light. Extraocular movements were full, visual field were full on confrontational test. Facial sensation and strength were normal. Uvula tongue midline. Head turning and shoulder shrug  were normal and symmetric. Motor: The motor testing reveals 5 over 5 strength of all 4 extremities. Good symmetric motor tone is noted throughout.  Sensory: Sensory testing is intact to soft touch on all 4 extremities. No evidence of extinction is noted.  Coordination: Cerebellar testing reveals good finger-nose-finger and heel-to-shin bilaterally.  Gait and station: Gait is normal. Tandem gait is normal. Romberg is negative. No drift is seen.  Reflexes: Deep tendon reflexes are symmetric and normal bilaterally.    DIAGNOSTIC DATA (LABS, IMAGING, TESTING) - I reviewed patient records, labs, notes, testing and imaging myself where available.  Lab Results  Component Value Date   WBC 7.3  03/13/2022   HGB 14.4 03/13/2022   HCT 43.7 03/13/2022   MCV 89.5 03/13/2022   PLT 392 03/13/2022      Component Value Date/Time   NA 136 03/13/2022 1517   K 4.0 03/13/2022 1517   CL 102 03/13/2022 1517   CO2 27 03/13/2022 1517   GLUCOSE 107 (H) 03/13/2022 1517   BUN 10 03/13/2022 1517   CREATININE 0.75 03/13/2022 1517   CALCIUM 9.3 03/13/2022 1517   PROT 8.4 (H) 03/13/2022 1517   ALBUMIN 4.6 03/13/2022 1517   AST 23  03/13/2022 1517   ALT 26 03/13/2022 1517   ALKPHOS 62 03/13/2022 1517   BILITOT 0.2 (L) 03/13/2022 1517   GFRNONAA >60 03/13/2022 1517   GFRAA >60 12/10/2019 1330   No results found for: "CHOL", "HDL", "LDLCALC", "LDLDIRECT", "TRIG", "CHOLHDL" Lab Results  Component Value Date   HGBA1C 5.6 05/04/2020   No results found for: "VITAMINB12" No results found for: "TSH"      No data to display               No data to display           ASSESSMENT AND PLAN  32 y.o. year old female  has a past medical history of Kidney stones, Migraine, and Ovarian cyst. here with    No diagnosis found.  Ruthe Vergara-Cerros reports doing well on [employer]. Baseline daily migraines, now having 4-7 per month. We will continue Emgality every 30 days and rizatriptan as needed. Healthy lifestyle habits encouraged. She was advised against pregnancy on Emgality. She reports having a tubal 08/2020. She will follow up with PCP as directed. She will return to see me in 1 year, sooner if needed. She verbalizes understanding and agreement with this plan.   No orders of the defined types were placed in this encounter.    No orders of the defined types were placed in this encounter.    Shawnie Dapper, MSN, FNP-C 02/06/2023, 9:22 AM  [facility] [address], [address] [place], [place] [address] [phone]

## 2023-02-07 ENCOUNTER — Ambulatory Visit: Payer: Medicaid Other | Admitting: Family Medicine

## 2023-02-07 DIAGNOSIS — G43711 Chronic migraine without aura, intractable, with status migrainosus: Secondary | ICD-10-CM

## 2023-02-08 ENCOUNTER — Telehealth: Payer: Self-pay

## 2023-02-08 ENCOUNTER — Other Ambulatory Visit (HOSPITAL_COMMUNITY): Payer: Self-pay

## 2023-02-08 NOTE — Telephone Encounter (Signed)
*  [facility]  Pharmacy Patient Advocate Encounter   Received notification from CoverMyMeds that prior authorization for Nurtec 75MG  dispersible tablets  is required/requested.   Insurance verification completed.   The patient is insured through [facility] .   Per test claim: PA required; PA submitted to [facility] via CoverMyMeds Key/confirmation #/EOC [id number] Status is pending

## 2023-02-14 NOTE — Telephone Encounter (Signed)
Pharmacy Patient Advocate Encounter  Received notification from [facility] MEDICAID that Prior Authorization for Nurtec 75MG  dispersible tablets has been APPROVED from 02/08/2023 to 02/08/2024   PA #/Case ID/Reference #: PA Case ID #: [id number]

## 2023-05-09 ENCOUNTER — Other Ambulatory Visit: Payer: Self-pay

## 2023-05-09 ENCOUNTER — Emergency Department (HOSPITAL_COMMUNITY)
Admission: EM | Admit: 2023-05-09 | Discharge: 2023-05-10 | Disposition: A | Discharge status: Home / Self Care | Payer: Medicaid Other | Attending: Emergency Medicine | Admitting: Emergency Medicine

## 2023-05-09 ENCOUNTER — Emergency Department (HOSPITAL_COMMUNITY): Payer: Medicaid Other

## 2023-05-09 ENCOUNTER — Encounter (HOSPITAL_COMMUNITY): Payer: Self-pay

## 2023-05-09 DIAGNOSIS — R519 Headache, unspecified: Secondary | ICD-10-CM | POA: Diagnosis not present

## 2023-05-09 DIAGNOSIS — R1030 Lower abdominal pain, unspecified: Secondary | ICD-10-CM | POA: Insufficient documentation

## 2023-05-09 DIAGNOSIS — K838 Other specified diseases of biliary tract: Secondary | ICD-10-CM | POA: Diagnosis not present

## 2023-05-09 DIAGNOSIS — N739 Female pelvic inflammatory disease, unspecified: Secondary | ICD-10-CM

## 2023-05-09 DIAGNOSIS — N854 Malposition of uterus: Secondary | ICD-10-CM | POA: Diagnosis not present

## 2023-05-09 DIAGNOSIS — R102 Pelvic and perineal pain: Secondary | ICD-10-CM

## 2023-05-09 DIAGNOSIS — K760 Fatty (change of) liver, not elsewhere classified: Secondary | ICD-10-CM | POA: Diagnosis not present

## 2023-05-09 DIAGNOSIS — R1031 Right lower quadrant pain: Secondary | ICD-10-CM | POA: Diagnosis not present

## 2023-05-09 LAB — WET PREP, GENITAL
Clue Cells Wet Prep HPF POC: NONE SEEN
Sperm: NONE SEEN
Trich, Wet Prep: NONE SEEN
WBC, Wet Prep HPF POC: <10 (ref <10)
Yeast Wet Prep HPF POC: NONE SEEN

## 2023-05-09 LAB — COMPREHENSIVE METABOLIC PANEL
ALT: 63 U/L — ABNORMAL HIGH (ref 0–44)
AST: 36 U/L (ref 15–41)
Albumin: 4.2 g/dL (ref 3.5–5.0)
Alkaline Phosphatase: 56 U/L (ref 38–126)
Anion gap: 9 (ref 5–15)
BUN: 12 mg/dL (ref 6–20)
CO2: 23 mmol/L (ref 22–32)
Calcium: 9.3 mg/dL (ref 8.9–10.3)
Chloride: 105 mmol/L (ref 98–111)
Creatinine, Ser: 0.73 mg/dL (ref 0.44–1.00)
GFR, Estimated: >60 mL/min (ref >60)
Glucose, Bld: 122 mg/dL — ABNORMAL HIGH (ref 70–99)
Potassium: 3.4 mmol/L — ABNORMAL LOW (ref 3.5–5.1)
Sodium: 137 mmol/L (ref 135–145)
Total Bilirubin: 0.3 mg/dL (ref 0.0–1.2)
Total Protein: 7.8 g/dL (ref 6.5–8.1)

## 2023-05-09 LAB — URINALYSIS, ROUTINE W REFLEX MICROSCOPIC
Bilirubin Urine: NEGATIVE
Glucose, UA: NEGATIVE mg/dL
Hgb urine dipstick: NEGATIVE
Ketones, ur: NEGATIVE mg/dL
Nitrite: NEGATIVE
Protein, ur: NEGATIVE mg/dL
Specific Gravity, Urine: 1.015 (ref 1.005–1.030)
pH: 5 (ref 5.0–8.0)

## 2023-05-09 LAB — CBC WITH DIFFERENTIAL/PLATELET
Abs Immature Granulocytes: 0.03 10*3/uL (ref 0.00–0.07)
Basophils Absolute: 0.1 10*3/uL (ref 0.0–0.1)
Basophils Relative: 1 %
Eosinophils Absolute: 0.1 10*3/uL (ref 0.0–0.5)
Eosinophils Relative: 1 %
HCT: 44 % (ref 36.0–46.0)
Hemoglobin: 14.6 g/dL (ref 12.0–15.0)
Immature Granulocytes: 0 %
Lymphocytes Relative: 21 %
Lymphs Abs: 2.3 10*3/uL (ref 0.7–4.0)
MCH: 30.1 pg (ref 26.0–34.0)
MCHC: 33.2 g/dL (ref 30.0–36.0)
MCV: 90.7 fL (ref 80.0–100.0)
Monocytes Absolute: 0.5 10*3/uL (ref 0.1–1.0)
Monocytes Relative: 5 %
Neutro Abs: 7.6 10*3/uL (ref 1.7–7.7)
Neutrophils Relative %: 72 %
Platelets: 354 10*3/uL (ref 150–400)
RBC: 4.85 MIL/uL (ref 3.87–5.11)
RDW: 13.8 % (ref 11.5–15.5)
WBC: 10.7 10*3/uL — ABNORMAL HIGH (ref 4.0–10.5)
nRBC: 0 % (ref 0.0–0.2)

## 2023-05-09 LAB — HCG, QUANTITATIVE, PREGNANCY: hCG, Beta Chain, Quant, S: <1 m[IU]/mL (ref <5)

## 2023-05-09 MED ORDER — PROCHLORPERAZINE EDISYLATE 10 MG/2ML IJ SOLN
5.0000 mg | Freq: Once | INTRAMUSCULAR | Status: AC
  Administered 2023-05-09: 5 mg via INTRAVENOUS
  Filled 2023-05-09: qty 2

## 2023-05-09 MED ORDER — CEFTRIAXONE SODIUM 1 G IJ SOLR
500.0000 mg | Freq: Once | INTRAMUSCULAR | Status: AC
  Administered 2023-05-10: 500 mg via INTRAMUSCULAR
  Filled 2023-05-09: qty 10

## 2023-05-09 MED ORDER — STERILE WATER FOR INJECTION IJ SOLN
INTRAMUSCULAR | Status: AC
  Administered 2023-05-10: 10 mL
  Filled 2023-05-09: qty 10

## 2023-05-09 MED ORDER — DOXYCYCLINE HYCLATE 100 MG PO TABS
100.0000 mg | ORAL_TABLET | Freq: Once | ORAL | Status: AC
  Administered 2023-05-10: 100 mg via ORAL
  Filled 2023-05-09: qty 1

## 2023-05-09 MED ORDER — KETOROLAC TROMETHAMINE 30 MG/ML IJ SOLN
15.0000 mg | Freq: Once | INTRAMUSCULAR | Status: AC
  Administered 2023-05-09: 15 mg via INTRAVENOUS
  Filled 2023-05-09: qty 1

## 2023-05-09 MED ORDER — IOHEXOL 300 MG/ML  SOLN
100.0000 mL | Freq: Once | INTRAMUSCULAR | Status: AC | PRN
  Administered 2023-05-09: 100 mL via INTRAVENOUS

## 2023-05-09 NOTE — ED Provider Notes (Signed)
Care assumed from Dr. Rubin Payor.  Patient here with lower abdominal and suprapubic pain with some vaginal spotting.  Pending CT scan.  Wet prep is negative, urinalysis with small amount of pyuria.  Did have cervical motion tenderness and empirically treating for PID.  CT scan negative for acute pathology. Normal appendix. \ Ultrasound shows corpus luteum cyst, no evidence of ovarian torsion. Patient feels improved on recheck.  Tolerating p.o.  Abdomen soft and nontender.  No right lower quadrant pain.  She clarifies she is not breast-feeding.  Will treat empirically for PID.   Glynn Octave, MD 05/10/23 [phone]

## 2023-05-09 NOTE — ED Provider Notes (Signed)
[facility] AT [facility] Provider Note   CSN: [id number] Arrival date & time: 05/09/23  1447     History  Chief Complaint  Patient presents with   Abdominal Pain    Sharon Garner is a 33 y.o. female.   Abdominal Pain Patient presents abdominal pain.  Has had for around 4 days.  Worse with certain movements.  Bilateral.  No dysuria.  No diarrhea or constipation.  States has had some vaginal spotting.  States due to start her menses yesterday.  States she has had previous tubal ligation.  Also headache.  History of migraines.  States this is her typical pain and has been hurting for the last 4 days also.      Home Medications Prior to Admission medications   Medication Sig Start Date End Date Taking? Authorizing Provider  acetaminophen (TYLENOL) 325 MG tablet Take 650 mg by mouth every 6 (six) hours as needed for moderate pain.    [provider]  ferrous sulfate 325 (65 FE) MG tablet Take 1 tablet (325 mg total) by mouth every other day. 09/07/20   Sheila Oats, MD  Galcanezumab-gnlm (EMGALITY) 120 MG/ML SOAJ Inject 120 mg into the skin every 30 (thirty) days. 01/22/22   Lomax, Amy, NP  ibuprofen (ADVIL) 600 MG tablet Take 1 tablet (600 mg total) by mouth every 6 (six) hours as needed. 03/13/22   Sherian Maroon A, PA  methylPREDNISolone (MEDROL DOSEPAK) 4 MG TBPK tablet Take as directed 01/25/22   Lomax, Amy, NP  ondansetron (ZOFRAN-ODT) 4 MG disintegrating tablet Take 1-2 tablets (4-8 mg total) by mouth every 8 (eight) hours as needed. 09/19/21   Anson Fret, MD  Prenatal Vit-Fe Fumarate-FA (PRENATAL VITAMINS) 28-0.8 MG TABS Take 1 tablet by mouth daily. 05/04/20   Nugent, Odie Sera, NP  Rimegepant Sulfate (NURTEC) 75 MG TBDP Take 75 mg (1 tablet) by mouth as needed. The maximum dose in a 24-hour period is 75 mg (1 tablet). 01/24/23   Lomax, Amy, NP      Allergies    Patient has no known allergies.    Review of Systems    Review of Systems  Gastrointestinal:  Positive for abdominal pain.    Physical Exam Updated Vital Signs BP 129/86 (BP Location: Left Arm)   Pulse 77   Temp 97.9 F (36.6 C) (Oral)   Resp 18   Ht 5\' 1"  (1.549 m)   Wt 77.1 kg   LMP 04/07/2023   SpO2 100%   BMI 32.12 kg/m  Physical Exam Vitals and nursing note reviewed.  HENT:     Head: Atraumatic.  Cardiovascular:     Rate and Rhythm: Normal rate.  Abdominal:     Tenderness: There is abdominal tenderness.     Hernia: No hernia is present.     Comments: Tenderness to suprapubic and lower abdomen.  No rebound or guarding.  No hernias palpated.  Genitourinary:    Comments: Somewhat difficult exam.  Does have some tenderness with cervical motion.  Bilateral adnexal tenderness.  Does have mild vaginal discharge. Neurological:     Mental Status: She is alert.     ED Results / Procedures / Treatments   Labs (all labs ordered are listed, but only abnormal results are displayed) Labs Reviewed  CBC WITH DIFFERENTIAL/PLATELET - Abnormal; Notable for the following components:      Result Value   WBC 10.7 (*)    All other components within normal limits  COMPREHENSIVE  METABOLIC PANEL - Abnormal; Notable for the following components:   Potassium 3.4 (*)    Glucose, Bld 122 (*)    ALT 63 (*)    All other components within normal limits  URINALYSIS, ROUTINE W REFLEX MICROSCOPIC - Abnormal; Notable for the following components:   APPearance HAZY (*)    Leukocytes,Ua SMALL (*)    Bacteria, UA RARE (*)    All other components within normal limits  WET PREP, GENITAL  HCG, QUANTITATIVE, PREGNANCY  HIV ANTIBODY (ROUTINE TESTING W REFLEX)  RPR  GC/CHLAMYDIA PROBE AMP ([facility]) NOT AT [facility]    EKG None  Radiology No results found.  Procedures Procedures    Medications Ordered in ED Medications  ketorolac (TORADOL) 30 MG/ML injection 15 mg (15 mg Intravenous Given 05/09/23 2250)  prochlorperazine (COMPAZINE) injection  5 mg (5 mg Intravenous Given 05/09/23 2252)    ED Course/ Medical Decision Making/ A&P                                 Medical Decision Making Amount and/or Complexity of Data Reviewed Labs: ordered. Radiology: ordered.  Risk Prescription drug management.   Patient with headache.  History of migraines.  States similar pain.  Will treat.  Doubt acute cause such as intracranial hemorrhage.  Also pelvic pain/lower abdominal pain.  No GI symptoms but does have tenderness.  Has had some slight spotting but is due to start her menses.  Mild vaginal discharge.  Differential diagnose includes pelvic causes such as pelvic inflammatory disease.  Also other causes such as appendicitis.  Will get CT scan to further evaluate.  Care turned over to Dr. Manus Gunning.        Final Clinical Impression(s) / ED Diagnoses Final diagnoses:  None    Rx / DC Orders ED Discharge Orders     None         Benjiman Core, MD 05/09/23 2312

## 2023-05-09 NOTE — ED Triage Notes (Addendum)
C/o lower abd pain x3 days. Denies n/v/d. Pain worse when bending over Pt reports migraine x4 days with hx of migraines pt reports she stopped taking migraine meds due to unable to afford them. Headache sensitive to light and sound.

## 2023-05-09 NOTE — ED Provider Triage Note (Signed)
Emergency Medicine Provider Triage Evaluation Note  Sharon Garner , a 33 y.o. female  was evaluated in triage.  Pt complains of migraine for 4 days not relieved by Tylenol. Ran out of normal migraine medicine. This migraine is similar to previous.   Also reports 4 days of lower abdominal pain. No N/V/D, no fever or chills. LMP Dec 15th 2024  Review of Systems  Positive: As above Negative: As above  Physical Exam  BP 117/76 (BP Location: Left Arm)   Pulse 79   Temp 98.5 F (36.9 C) (Oral)   Resp 12   Ht 5\' 1"  (1.549 m)   Wt 77.1 kg   LMP 04/07/2023   SpO2 99%   BMI 32.12 kg/m  Gen:   Awake, no distress   Resp:  Normal effort  MSK:   Moves extremities without difficulty    Medical Decision Making  Medically screening exam initiated at 3:13 PM.  Appropriate orders placed.  Sharon Garner was informed that the remainder of the evaluation will be completed by another provider, this initial triage assessment does not replace that evaluation, and the importance of remaining in the ED until their evaluation is complete.     Arabella Merles, PA-C 05/09/23 1516

## 2023-05-10 DIAGNOSIS — R102 Pelvic and perineal pain: Secondary | ICD-10-CM | POA: Diagnosis not present

## 2023-05-10 LAB — HIV ANTIBODY (ROUTINE TESTING W REFLEX): HIV Screen 4th Generation wRfx: NONREACTIVE

## 2023-05-10 LAB — GC/CHLAMYDIA PROBE AMP (~~LOC~~) NOT AT ARMC
Chlamydia: NEGATIVE
Comment: NEGATIVE
Comment: NORMAL
Neisseria Gonorrhea: NEGATIVE

## 2023-05-10 LAB — RPR: RPR Ser Ql: NONREACTIVE

## 2023-05-10 MED ORDER — DOXYCYCLINE HYCLATE 100 MG PO CAPS: 100.0000 mg | ORAL_CAPSULE | Freq: Two times a day (BID) | ORAL | 0 refills | Status: DC

## 2023-05-10 NOTE — ED Notes (Signed)
I ACCIDENTALLY PUT ROOM 13 VITALS AS ROOM 14 I WILL EDIT IT ONCE ROOM 14 COMES BACK FROM  CT SCAN.

## 2023-05-10 NOTE — ED Notes (Signed)
 Patient d/c with home care instructions. IV discontinued.

## 2023-05-10 NOTE — Discharge Instructions (Addendum)
Your testing is reassuring.  Does show cyst on your right ovary.  Take the antibiotics for possible pelvic inflammatory disease.  Follow-up with your gynecologist.  Your sexual partner should be treated as well.  Return to the ED with new or worsening symptoms.

## 2023-05-17 ENCOUNTER — Telehealth: Payer: Self-pay

## 2023-05-17 DIAGNOSIS — R002 Palpitations: Secondary | ICD-10-CM | POA: Diagnosis not present

## 2023-05-17 DIAGNOSIS — G43109 Migraine with aura, not intractable, without status migrainosus: Secondary | ICD-10-CM | POA: Diagnosis not present

## 2023-05-17 DIAGNOSIS — R1030 Lower abdominal pain, unspecified: Secondary | ICD-10-CM | POA: Diagnosis not present

## 2023-05-17 DIAGNOSIS — Z1329 Encounter for screening for other suspected endocrine disorder: Secondary | ICD-10-CM | POA: Diagnosis not present

## 2023-05-17 DIAGNOSIS — R42 Dizziness and giddiness: Secondary | ICD-10-CM | POA: Diagnosis not present

## 2023-05-17 DIAGNOSIS — R059 Cough, unspecified: Secondary | ICD-10-CM | POA: Diagnosis not present

## 2023-05-17 NOTE — Telephone Encounter (Signed)
This patient came into the office today to set up a follow up appointment and discuss medication refills. She states that she is needing a refill and PA for Emgality 120MG /ML auto-injectors (migraine) . She has been scheduled for an appt with Amy Lomax NP on 7/21 at 1:30. I did see a PA approval in her chart from 12/2022-12/2023 but I am unsure if this is still effective. If possible, she would like a refill sent to [id number] - [place], [place] - [address]

## 2023-05-20 ENCOUNTER — Other Ambulatory Visit: Payer: Self-pay

## 2023-05-20 ENCOUNTER — Emergency Department (HOSPITAL_COMMUNITY)
Admission: EM | Admit: 2023-05-20 | Discharge: 2023-05-21 | Disposition: A | Discharge status: Home / Self Care | Payer: Medicaid Other | Attending: Emergency Medicine | Admitting: Emergency Medicine

## 2023-05-20 DIAGNOSIS — Z79899 Other long term (current) drug therapy: Secondary | ICD-10-CM | POA: Insufficient documentation

## 2023-05-20 DIAGNOSIS — R42 Dizziness and giddiness: Secondary | ICD-10-CM | POA: Insufficient documentation

## 2023-05-20 DIAGNOSIS — R002 Palpitations: Secondary | ICD-10-CM | POA: Insufficient documentation

## 2023-05-20 LAB — BASIC METABOLIC PANEL
Anion gap: 8 (ref 5–15)
BUN: 11 mg/dL (ref 6–20)
CO2: 24 mmol/L (ref 22–32)
Calcium: 9.2 mg/dL (ref 8.9–10.3)
Chloride: 102 mmol/L (ref 98–111)
Creatinine, Ser: 0.67 mg/dL (ref 0.44–1.00)
GFR, Estimated: >60 mL/min (ref >60)
Glucose, Bld: 102 mg/dL — ABNORMAL HIGH (ref 70–99)
Potassium: 3.7 mmol/L (ref 3.5–5.1)
Sodium: 134 mmol/L — ABNORMAL LOW (ref 135–145)

## 2023-05-20 LAB — URINALYSIS, ROUTINE W REFLEX MICROSCOPIC
Bilirubin Urine: NEGATIVE
Glucose, UA: NEGATIVE mg/dL
Ketones, ur: NEGATIVE mg/dL
Nitrite: NEGATIVE
Protein, ur: 30 mg/dL — AB
RBC / HPF: >50 RBC/hpf (ref 0–5)
Specific Gravity, Urine: 1.009 (ref 1.005–1.030)
pH: 6 (ref 5.0–8.0)

## 2023-05-20 LAB — CBC WITH DIFFERENTIAL/PLATELET
Abs Immature Granulocytes: 0.02 10*3/uL (ref 0.00–0.07)
Basophils Absolute: 0 10*3/uL (ref 0.0–0.1)
Basophils Relative: 1 %
Eosinophils Absolute: 0.2 10*3/uL (ref 0.0–0.5)
Eosinophils Relative: 2 %
HCT: 41 % (ref 36.0–46.0)
Hemoglobin: 13.6 g/dL (ref 12.0–15.0)
Immature Granulocytes: 0 %
Lymphocytes Relative: 31 %
Lymphs Abs: 2.5 10*3/uL (ref 0.7–4.0)
MCH: 30.1 pg (ref 26.0–34.0)
MCHC: 33.2 g/dL (ref 30.0–36.0)
MCV: 90.7 fL (ref 80.0–100.0)
Monocytes Absolute: 0.6 10*3/uL (ref 0.1–1.0)
Monocytes Relative: 7 %
Neutro Abs: 4.9 10*3/uL (ref 1.7–7.7)
Neutrophils Relative %: 59 %
Platelets: 404 10*3/uL — ABNORMAL HIGH (ref 150–400)
RBC: 4.52 MIL/uL (ref 3.87–5.11)
RDW: 13.6 % (ref 11.5–15.5)
WBC: 8.2 10*3/uL (ref 4.0–10.5)
nRBC: 0 % (ref 0.0–0.2)

## 2023-05-20 LAB — PREGNANCY, URINE: Preg Test, Ur: NEGATIVE

## 2023-05-20 MED ORDER — ONDANSETRON 4 MG PO TBDP
4.0000 mg | ORAL_TABLET | Freq: Once | ORAL | Status: AC
  Administered 2023-05-20: 4 mg via ORAL
  Filled 2023-05-20: qty 1

## 2023-05-20 NOTE — ED Provider Triage Note (Signed)
Emergency Medicine Provider Triage Evaluation Note  Sharon Garner , a 33 y.o. female  was evaluated in triage.  Pt complains of weakness and dizziness for the past 2 weeks.  Patient states that she feels the room spinning around her but does not feel unstable.  Patient denies recent illness or tinnitus.  Patient states this occurs at rest.  Denies shortness of breath, chest pain, vomiting, vaginal bleeding.  Patient does endorse nausea.  Review of Systems  Positive:  Negative:   Physical Exam  BP (!) 135/97   Pulse 67   Temp 98 F (36.7 C)   Resp 16   Ht 5\' 1"  (1.549 m)   Wt 77 kg   LMP 04/07/2023   SpO2 99%   BMI 32.07 kg/m  Gen:   Awake, no distress   Resp:  Normal effort  MSK:   Moves extremities without difficulty  Other:    Medical Decision Making  Medically screening exam initiated at 7:32 PM.  Appropriate orders placed.  Katlyne Vergara-Cerros was informed that the remainder of the evaluation will be completed by another provider, this initial triage assessment does not replace that evaluation, and the importance of remaining in the ED until their evaluation is complete.  Workup initiated, patient stable at this time.   Netta Corrigan, [place] 05/20/23 1933

## 2023-05-20 NOTE — ED Triage Notes (Signed)
Patient c/o weakness x 2 weeks. Patient report HR is raising at times. Patient c/o nausea, denies Vomitting.

## 2023-05-21 MED ORDER — MECLIZINE HCL 25 MG PO TABS
25.0000 mg | ORAL_TABLET | Freq: Once | ORAL | Status: AC
  Administered 2023-05-21: 25 mg via ORAL
  Filled 2023-05-21: qty 1

## 2023-05-21 MED ORDER — MECLIZINE HCL 25 MG PO TABS: 25.0000 mg | ORAL_TABLET | Freq: Three times a day (TID) | ORAL | 0 refills | Status: DC | PRN

## 2023-05-21 NOTE — ED Provider Notes (Signed)
EMERGENCY DEPARTMENT AT [facility] Provider Note  CSN: [id number] Arrival date & time: 05/20/23 1859  Chief Complaint(s) Dizziness  HPI Sharon Garner is a 33 y.o. female with a past medical history listed below who presents to the emergency department with several weeks of intermittent dizzy spells feeling like the room is spinning.  These episodes last 2 to 3 minutes at a time and self resolved.  However with the episodes, she reports feeling her heart racing and generally weak.  She reports that the episodes makes her anxious she does not want to pass out while at home and not have anybody take care of her children.  Patient states that she came in because today she had 5-6 episodes throughout the day.  She denies any recent fevers or infections.  No coughing or congestion.  No shortness of breath.  No nausea or vomiting.  Denies any urinary symptoms.  Patient is currently on her menstrual cycle.  The history is provided by the patient.    Past Medical History Past Medical History:  Diagnosis Date   Kidney stones    kidney stones removed 2021   Migraine    Ovarian cyst    Patient Active Problem List   Diagnosis Date Noted   History of 2 cesarean sections 09/05/2020   Cesarean delivery delivered 09/05/2020   [redacted] weeks gestation of pregnancy 08/11/2020   UTI (urinary tract infection) during pregnancy 07/26/2020   Unwanted fertility 06/01/2020   Round ligament pain 05/04/2020   Back pain in pregnancy 05/04/2020   Obesity in pregnancy 05/04/2020   Supervision of other normal pregnancy, antepartum 01/28/2020   Short interval between pregnancies affecting pregnancy in third trimester, antepartum 01/28/2020   History of C-section 05/11/2019   Family history of intellectual disability 05/16/2012   Chronic migraine without aura, with intractable migraine, so stated, with status migrainosus 11/09/2011   Home Medication(s) Prior to Admission medications    Medication Sig Start Date End Date Taking? Authorizing Provider  meclizine (ANTIVERT) 25 MG tablet Take 1 tablet (25 mg total) by mouth 3 (three) times daily as needed for dizziness. 05/21/23  Yes Kym Fenter, Amadeo Garnet, MD  acetaminophen (TYLENOL) 325 MG tablet Take 650 mg by mouth every 6 (six) hours as needed for moderate pain.    [provider]  doxycycline (VIBRAMYCIN) 100 MG capsule Take 1 capsule (100 mg total) by mouth 2 (two) times daily. 05/10/23   Rancour, Jeannett Senior, MD  ferrous sulfate 325 (65 FE) MG tablet Take 1 tablet (325 mg total) by mouth every other day. 09/07/20   Sheila Oats, MD  Galcanezumab-gnlm (EMGALITY) 120 MG/ML SOAJ Inject 120 mg into the skin every 30 (thirty) days. 01/22/22   Lomax, Amy, NP  ibuprofen (ADVIL) 600 MG tablet Take 1 tablet (600 mg total) by mouth every 6 (six) hours as needed. 03/13/22   Sherian Maroon A, PA  methylPREDNISolone (MEDROL DOSEPAK) 4 MG TBPK tablet Take as directed 01/25/22   Lomax, Amy, NP  ondansetron (ZOFRAN-ODT) 4 MG disintegrating tablet Take 1-2 tablets (4-8 mg total) by mouth every 8 (eight) hours as needed. 09/19/21   Anson Fret, MD  Prenatal Vit-Fe Fumarate-FA (PRENATAL VITAMINS) 28-0.8 MG TABS Take 1 tablet by mouth daily. 05/04/20   Nugent, Odie Sera, NP  Rimegepant Sulfate (NURTEC) 75 MG TBDP Take 75 mg (1 tablet) by mouth as needed. The maximum dose in a 24-hour period is 75 mg (1 tablet). 01/24/23   Shawnie Dapper, NP  Allergies Patient has no known allergies.  Review of Systems Review of Systems As noted in HPI  Physical Exam Vital Signs  I have reviewed the triage vital signs BP 115/81 (BP Location: Left Arm)   Pulse 73   Temp 97.9 F (36.6 C) (Oral)   Resp 16   Ht 5\' 1"  (1.549 m)   Wt 77 kg   LMP 04/07/2023   SpO2 98%   BMI 32.07 kg/m    Physical Exam Vitals reviewed.   Constitutional:      General: She is not in acute distress.    Appearance: She is well-developed. She is not diaphoretic.  HENT:     Head: Normocephalic and atraumatic.     Nose: Nose normal.  Eyes:     General: No scleral icterus.       Right eye: No discharge.        Left eye: No discharge.     Conjunctiva/sclera: Conjunctivae normal.     Pupils: Pupils are equal, round, and reactive to light.  Cardiovascular:     Rate and Rhythm: Normal rate and regular rhythm.     Heart sounds: No murmur heard.    No friction rub. No gallop.  Pulmonary:     Effort: Pulmonary effort is normal. No respiratory distress.     Breath sounds: Normal breath sounds. No stridor. No rales.  Abdominal:     General: There is no distension.     Palpations: Abdomen is soft.     Tenderness: There is no abdominal tenderness.  Musculoskeletal:        General: No tenderness.     Cervical back: Normal range of motion and neck supple.  Skin:    General: Skin is warm and dry.     Findings: No erythema or rash.  Neurological:     Mental Status: She is alert and oriented to person, place, and time.     Cranial Nerves: Cranial nerves 2-12 are intact.     Sensory: Sensation is intact.     Motor: Motor function is intact.     Coordination: Coordination is intact.     Gait: Gait is intact.     ED Results and Treatments Labs (all labs ordered are listed, but only abnormal results are displayed) Labs Reviewed  BASIC METABOLIC PANEL - Abnormal; Notable for the following components:      Result Value   Sodium 134 (*)    Glucose, Bld 102 (*)    All other components within normal limits  CBC WITH DIFFERENTIAL/PLATELET - Abnormal; Notable for the following components:   Platelets 404 (*)    All other components within normal limits  URINALYSIS, ROUTINE W REFLEX MICROSCOPIC - Abnormal; Notable for the following components:   APPearance HAZY (*)    Hgb urine dipstick LARGE (*)    Protein, ur 30 (*)     Leukocytes,Ua MODERATE (*)    Bacteria, UA FEW (*)    All other components within normal limits  PREGNANCY, URINE  EKG  EKG Interpretation Date/Time:  Monday May 20 2023 19:42:08 EST Ventricular Rate:  73 PR Interval:  151 QRS Duration:  88 QT Interval:  410 QTC Calculation: 452 R Axis:   60  Text Interpretation: Sinus rhythm No significant change was found Confirmed by Drema Pry [phone]) on 05/21/2023 4:38:41 AM       Radiology No results found.  Medications Ordered in ED Medications  ondansetron (ZOFRAN-ODT) disintegrating tablet 4 mg (4 mg Oral Given 05/20/23 1937)  meclizine (ANTIVERT) tablet 25 mg (25 mg Oral Given 05/21/23 0517)   Procedures Procedures  (including critical care time) Medical Decision Making / ED Course   Medical Decision Making Amount and/or Complexity of Data Reviewed Labs: ordered. Decision-making details documented in ED Course. ECG/medicine tests: ordered and independent interpretation performed. Decision-making details documented in ED Course.    Patient presents with vertiginous symptoms.  Exam is really reassuring and not concerning for central process.  EKG without dysrhythmias or blocks. CBC without leukocytosis or anemia.  Metabolic panel without significant electrolyte derangements.  UPT negative ruling out pregnancy related process. UA ordered in triage contaminated from menstruation.  She does not have any urinary symptoms concerning for UTI. No need to repeat.      Final Clinical Impression(s) / ED Diagnoses Final diagnoses:  Vertigo  Palpitations   The patient appears reasonably screened and/or stabilized for discharge and I doubt any other medical condition or other [facility] requiring further screening, evaluation, or treatment in the ED at this time. I have discussed the findings, Dx and Tx plan with the  patient/family who expressed understanding and agree(s) with the plan. Discharge instructions discussed at length. The patient/family was given strict return precautions who verbalized understanding of the instructions. No further questions at time of discharge.  Disposition: Discharge  Condition: Good  ED Discharge Orders          Ordered    meclizine (ANTIVERT) 25 MG tablet  3 times daily PRN        05/21/23 0458             Follow Up: Primary care provider  Call  to schedule an appointment for close follow up    This chart was dictated using voice recognition software.  Despite best efforts to proofread,  errors can occur which can change the documentation meaning.    Nira Conn, MD 05/21/23 [phone]

## 2023-06-12 ENCOUNTER — Ambulatory Visit
Admission: EM | Admit: 2023-06-12 | Discharge: 2023-06-12 | Disposition: A | Discharge status: Home / Self Care | Payer: Medicaid Other

## 2023-06-12 DIAGNOSIS — F411 Generalized anxiety disorder: Secondary | ICD-10-CM

## 2023-06-12 DIAGNOSIS — R112 Nausea with vomiting, unspecified: Secondary | ICD-10-CM | POA: Diagnosis not present

## 2023-06-12 LAB — POCT URINALYSIS DIP (MANUAL ENTRY)
Bilirubin, UA: NEGATIVE
Glucose, UA: NEGATIVE mg/dL
Ketones, POC UA: NEGATIVE mg/dL
Leukocytes, UA: NEGATIVE
Nitrite, UA: NEGATIVE
Protein Ur, POC: NEGATIVE mg/dL
Spec Grav, UA: 1.015 (ref 1.010–1.025)
Urobilinogen, UA: 1 U/dL
pH, UA: 6.5 (ref 5.0–8.0)

## 2023-06-12 NOTE — ED Notes (Signed)
Patient is being discharged from the Urgent Care and sent to the Emergency Department via self Private Vehicle for Abd Pain (Unknown Etiology, Depression/Anxiety) . Per R. Reggie Pile, patient is in need of higher level of care due to Symptoms above. Patient is aware and verbalizes understanding of plan of care.  Vitals:   06/12/23 0835  BP: 114/74  Pulse: 83  Resp: 18  Temp: 98 F (36.7 C)  SpO2: 97%

## 2023-06-12 NOTE — ED Notes (Signed)
Verbal Order (Note): Provider unable to access a computer creating unreasonable inconvenience to the ordering provider with possible delay in patient care. Acknowledged by Arlys John & Provider. Repeated Verbal order by Arlys John Provider. For GAD7 and PHQ2/9 Completion and scoring.

## 2023-06-12 NOTE — ED Triage Notes (Signed)
See Flowsheets for details

## 2023-06-12 NOTE — ED Triage Notes (Signed)
"  For the past month or so I have been having issues with my BP going hight 150 something over 100 something, this leads me to being anxious and nervous. Another example is one of my son's is sick and I have a hard time handling that even". "Starting yesterday around 10am with abd pain and random vomiting after, last emesis around 12mn". No fever. No cough. No runny nose.   "I don't have a PCP, I usually go to the [facility])". "I currently have Medicaid".

## 2023-06-12 NOTE — ED Triage Notes (Signed)
See Flowsheets

## 2023-06-12 NOTE — ED Provider Notes (Signed)
Patient here today with several complaints including anxiety and nausea, vomiting. She reports anxiety has been ongoing but she does not have pcp and is currently untreated. She reports elevated blood pressure at times. She also reports abdominal pain that started yesterday with continued nausea and vomiting overnight. She does have history of ovarian cysts. Discussed that we were unable to treat anxiety and given this is  her primary complaint recommended further evaluation in the ED for both mental and physical health evaluation.  Patient is agreeable to same. Husband will transport her via POV. She is not currently a risk to herself.   Tomi Bamberger, PA-C 06/12/23 [phone]

## 2023-06-13 DIAGNOSIS — R002 Palpitations: Secondary | ICD-10-CM | POA: Diagnosis not present

## 2023-06-13 DIAGNOSIS — R112 Nausea with vomiting, unspecified: Secondary | ICD-10-CM | POA: Diagnosis not present

## 2023-06-13 DIAGNOSIS — R42 Dizziness and giddiness: Secondary | ICD-10-CM | POA: Diagnosis not present

## 2023-06-13 DIAGNOSIS — R7303 Prediabetes: Secondary | ICD-10-CM | POA: Diagnosis not present

## 2023-06-13 DIAGNOSIS — F419 Anxiety disorder, unspecified: Secondary | ICD-10-CM | POA: Diagnosis not present

## 2023-06-28 DIAGNOSIS — F3181 Bipolar II disorder: Secondary | ICD-10-CM | POA: Diagnosis not present

## 2023-07-17 ENCOUNTER — Ambulatory Visit (INDEPENDENT_AMBULATORY_CARE_PROVIDER_SITE_OTHER): Payer: Medicaid Other | Admitting: Internal Medicine

## 2023-07-17 ENCOUNTER — Encounter: Payer: Self-pay | Admitting: Internal Medicine

## 2023-07-17 VITALS — BP 112/74 | HR 81 | Temp 98.4°F | Ht 61.0 in | Wt 167.4 lb

## 2023-07-17 DIAGNOSIS — G43711 Chronic migraine without aura, intractable, with status migrainosus: Secondary | ICD-10-CM | POA: Diagnosis not present

## 2023-07-17 DIAGNOSIS — R42 Dizziness and giddiness: Secondary | ICD-10-CM

## 2023-07-17 DIAGNOSIS — L659 Nonscarring hair loss, unspecified: Secondary | ICD-10-CM | POA: Diagnosis not present

## 2023-07-17 LAB — CBC WITH DIFFERENTIAL/PLATELET
Basophils Absolute: 0 10*3/uL (ref 0.0–0.1)
Basophils Relative: 0.4 % (ref 0.0–3.0)
Eosinophils Absolute: 0.2 10*3/uL (ref 0.0–0.7)
Eosinophils Relative: 2.5 % (ref 0.0–5.0)
HCT: 42.5 % (ref 36.0–46.0)
Hemoglobin: 14.3 g/dL (ref 12.0–15.0)
Lymphocytes Relative: 27.5 % (ref 12.0–46.0)
Lymphs Abs: 2.1 10*3/uL (ref 0.7–4.0)
MCHC: 33.6 g/dL (ref 30.0–36.0)
MCV: 91.2 fl (ref 78.0–100.0)
Monocytes Absolute: 0.4 10*3/uL (ref 0.1–1.0)
Monocytes Relative: 5.1 % (ref 3.0–12.0)
Neutro Abs: 5 10*3/uL (ref 1.4–7.7)
Neutrophils Relative %: 64.5 % (ref 43.0–77.0)
Platelets: 362 10*3/uL (ref 150.0–400.0)
RBC: 4.66 Mil/uL (ref 3.87–5.11)
RDW: 13.5 % (ref 11.5–15.5)
WBC: 7.7 10*3/uL (ref 4.0–10.5)

## 2023-07-17 LAB — COMPREHENSIVE METABOLIC PANEL WITH GFR
ALT: 29 U/L (ref 0–35)
AST: 23 U/L (ref 0–37)
Albumin: 4.5 g/dL (ref 3.5–5.2)
Alkaline Phosphatase: 60 U/L (ref 39–117)
BUN: 11 mg/dL (ref 6–23)
CO2: 28 meq/L (ref 19–32)
Calcium: 9.2 mg/dL (ref 8.4–10.5)
Chloride: 103 meq/L (ref 96–112)
Creatinine, Ser: 0.73 mg/dL (ref 0.40–1.20)
GFR: 108.34 mL/min (ref >60.00)
Glucose, Bld: 105 mg/dL — ABNORMAL HIGH (ref 70–99)
Potassium: 4.1 meq/L (ref 3.5–5.1)
Sodium: 138 meq/L (ref 135–145)
Total Bilirubin: 0.3 mg/dL (ref 0.2–1.2)
Total Protein: 7.6 g/dL (ref 6.0–8.3)

## 2023-07-17 LAB — GLUCOSE, POCT (MANUAL RESULT ENTRY): POC Glucose: 131 mg/dL — AB (ref 70–99)

## 2023-07-17 LAB — HEMOGLOBIN A1C: Hgb A1c MFr Bld: 5.8 % (ref 4.6–6.5)

## 2023-07-17 NOTE — Progress Notes (Signed)
 [facility] PRIMARY [facility]ARE [facility] [address] [place] [address] Dept: [phone] Dept Fax: [phone]  New Patient Office Visit  Subjective:   Jehan Ranganathan 11/20/1990 07/17/2023  Chief Complaint  Patient presents with   Establish Care    HPI: Donnie Gedeon presents today to establish care at [facility]. Introduced to Publishing rights manager role and practice setting.  All questions answered.  Concerns: See below   Discussed the use of AI scribe software for clinical note transcription with the patient, who gave verbal consent to proceed.  History of Present Illness   The patient, with a history of migraines, kidney stones, and ovarian cysts, and IDA during pregnancy, presents with daily episodes of dizziness and heart palpitations for the past several months. The episodes, which last about five minutes in duration, are characterized by lightheadedness, generalized weakness, and a sensation of the heart racing. Episodes occurs two to three times a day whether during activity or at rest. The patient has noticed an increase in thirst and urination, and has experienced some hair loss. She has a family history of diabetes and has recently made dietary changes to eat healthier. The patient has also been experiencing high blood pressure during these episodes, with readings as high as 165/108. She denies any fever, chest pain, SHOB, syncope, N/V, diaphoresis, but does report some blurry vision during the episodes. The patient has been experiencing decreased appetite, with some days of not eating at all.   She does have upcoming appt with neurology to discuss migraine management as the injectable Emgality is too costly for patient. She does take nurtec as needed for abortive therapy.         The following portions of the patient's history were reviewed and updated as appropriate: past medical history, past  surgical history, family history, social history, allergies, medications, and problem list.   Patient Active Problem List   Diagnosis Date Noted   Round ligament pain 05/04/2020   Chronic migraine without aura, with intractable migraine, so stated, with status migrainosus 11/09/2011   Migraine headache without aura 11/09/2011   Past Medical History:  Diagnosis Date   Family history of intellectual disability 05/16/2012   Kidney stones    kidney stones removed 2021   Migraine    Ovarian cyst    Short interval between pregnancies affecting pregnancy in third trimester, antepartum 01/28/2020   Past Surgical History:  Procedure Laterality Date   CESAREAN SECTION N/A 10/17/2012   Procedure: CESAREAN SECTION;  Surgeon: Tereso Newcomer, MD;  Location: [facility] ORS;  Service: Obstetrics;  Laterality: N/A;   CESAREAN SECTION N/A 05/11/2019   Procedure: CESAREAN SECTION;  Surgeon: Conan Bowens, MD;  Location: [facility] LD ORS;  Service: Obstetrics;  Laterality: N/A;   CESAREAN SECTION WITH BILATERAL TUBAL LIGATION N/A 09/05/2020   Procedure: CESAREAN SECTION WITH BILATERAL TUBAL LIGATION;  Surgeon: Warden Fillers, MD;  Location: [facility] LD ORS;  Service: Obstetrics;  Laterality: N/A;   KIDNEY STONE SURGERY  2021   Family History  Problem Relation Age of Onset   Cancer Mother        reproductive   Migraines Paternal Aunt    Other Neg Hx    Alcohol abuse Neg Hx    Arthritis Neg Hx    Asthma Neg Hx    Birth defects Neg Hx    COPD Neg Hx    Depression Neg Hx    Diabetes Neg Hx    Drug abuse  Neg Hx    Early death Neg Hx    Hearing loss Neg Hx    Heart disease Neg Hx    Hyperlipidemia Neg Hx    Hypertension Neg Hx    Kidney disease Neg Hx    Learning disabilities Neg Hx    Mental illness Neg Hx    Mental retardation Neg Hx    Miscarriages / Stillbirths Neg Hx    Stroke Neg Hx    Vision loss Neg Hx     Current Outpatient Medications:    acetaminophen (TYLENOL) 325 MG tablet, Take 650 mg by  mouth every 6 (six) hours as needed for moderate pain., Disp: , Rfl:    ASHWAGANDHA PO, Take 1 capsule by mouth daily., Disp: , Rfl:    doxycycline (VIBRAMYCIN) 100 MG capsule, Take 1 capsule (100 mg total) by mouth 2 (two) times daily., Disp: 20 capsule, Rfl: 0   famotidine (PEPCID) 20 MG tablet, Take 20 mg by mouth 2 (two) times daily., Disp: , Rfl:    ibuprofen (ADVIL) 600 MG tablet, Take 1 tablet (600 mg total) by mouth every 6 (six) hours as needed., Disp: 30 tablet, Rfl: 0   ferrous sulfate 325 (65 FE) MG tablet, Take 1 tablet (325 mg total) by mouth every other day. (Patient not taking: Reported on 07/17/2023), Disp: 30 tablet, Rfl: 2   Galcanezumab-gnlm (EMGALITY) 120 MG/ML SOAJ, Inject 120 mg into the skin every 30 (thirty) days. (Patient not taking: Reported on 07/17/2023), Disp: 3 mL, Rfl: 3   methylergonovine (METHERGINE) 0.2 MG tablet, Take 0.2 mg by mouth 3 (three) times daily. (Patient not taking: Reported on 07/17/2023), Disp: , Rfl:    Rimegepant Sulfate (NURTEC) 75 MG TBDP, Take 75 mg (1 tablet) by mouth as needed. The maximum dose in a 24-hour period is 75 mg (1 tablet). (Patient not taking: Reported on 07/17/2023), Disp: 8 tablet, Rfl: 0 No Known Allergies  ROS: A complete ROS was performed with pertinent positives/negatives noted in the HPI. The remainder of the ROS are negative.   Objective:   Today's Vitals   07/17/23 1415  BP: 112/74  Pulse: 81  Temp: 98.4 F (36.9 C)  TempSrc: Temporal  SpO2: 98%  Weight: 167 lb 6.4 oz (75.9 kg)  Height: 5\' 1"  (1.549 m)    GENERAL: Well-appearing, in NAD. Well nourished.  SKIN: Pink, warm and dry. No rash, lesion, ulceration, or ecchymoses.  NECK: Trachea midline. Full ROM w/o pain or tenderness. No lymphadenopathy. No thyromegaly or palpable masses.  RESPIRATORY: Chest wall symmetrical. Respirations even and non-labored. Breath sounds clear to auscultation bilaterally.  CARDIAC: S1, S2 present, regular rate and rhythm. Peripheral  pulses 2+ bilaterally.  EXTREMITIES: Without clubbing, cyanosis, or edema.  NEUROLOGIC: No motor or sensory deficits. Steady, even gait.  PSYCH/MENTAL STATUS: Alert, oriented x 3. Cooperative, appropriate mood and affect.   Health Maintenance Due  Topic Date Due   Cervical Cancer Screening (HPV/Pap Cotest)  01/28/2023    Results for orders placed or performed in visit on 07/17/23  POCT glucose (manual entry)  Result Value Ref Range   POC Glucose 131 (A) 70 - 99 mg/dl    Assessment & Plan:  Assessment and Plan    Dizziness  Experiencing daily dizziness, lightheadedness, and palpitations with elevated blood pressure. Differential includes diabetes, anemia, electrolyte imbalance, thyroid dysfunction, and cardiac issues. - Order A1c. - Order complete blood count. - Order comprehensive metabolic panel. - Order thyroid function tests. - Consider heart monitor if  labs normal and symptoms persist.  Hair Loss Significant hair loss potentially related to iron deficiency, thyroid dysfunction, or B12 deficiency. - Order iron studies. - Order thyroid function tests. - Order B12 level.  Migraine Headaches Previously managed with Emgality, now using Nurtec. Neurology follow-up planned. - Continue Nurtec as needed. - Follow up with neurologist.       Orders Placed This Encounter  Procedures   CBC with Differential/Platelet   Comp Met (CMET)   Iron, TIBC and Ferritin Panel   Hemoglobin A1C   TSH   B12 and Folate Panel   POCT glucose (manual entry)   No orders of the defined types were placed in this encounter.   Return in about 4 months (around 11/16/2023) for Annual Physical Exam with fasting lab work.   Salvatore Decent, FNP

## 2023-07-18 LAB — TSH: TSH: 1.26 u[IU]/mL (ref 0.35–5.50)

## 2023-07-18 LAB — IRON,TIBC AND FERRITIN PANEL
%SAT: 36 % (ref 16–45)
Ferritin: 16 ng/mL (ref 16–154)
Iron: 150 ug/dL (ref 40–190)
TIBC: 411 ug/dL (ref 250–450)

## 2023-07-18 LAB — B12 AND FOLATE PANEL
Folate: 16.5 ng/mL (ref >5.9)
Vitamin B-12: 410 pg/mL (ref 211–911)

## 2023-07-19 ENCOUNTER — Other Ambulatory Visit: Payer: Self-pay

## 2023-07-19 ENCOUNTER — Ambulatory Visit
Admission: EM | Admit: 2023-07-19 | Discharge: 2023-07-19 | Disposition: A | Discharge status: Home / Self Care | Attending: Family Medicine | Admitting: Family Medicine

## 2023-07-19 ENCOUNTER — Encounter: Payer: Self-pay | Admitting: *Deleted

## 2023-07-19 ENCOUNTER — Encounter: Payer: Self-pay | Admitting: Internal Medicine

## 2023-07-19 DIAGNOSIS — R070 Pain in throat: Secondary | ICD-10-CM

## 2023-07-19 DIAGNOSIS — J069 Acute upper respiratory infection, unspecified: Secondary | ICD-10-CM | POA: Diagnosis not present

## 2023-07-19 DIAGNOSIS — R7303 Prediabetes: Secondary | ICD-10-CM | POA: Insufficient documentation

## 2023-07-19 LAB — POCT RAPID STREP A (OFFICE): Rapid Strep A Screen: NEGATIVE

## 2023-07-19 LAB — POCT INFLUENZA A/B
Influenza A, POC: NEGATIVE
Influenza B, POC: NEGATIVE

## 2023-07-19 MED ORDER — IBUPROFEN 600 MG PO TABS: 600.0000 mg | ORAL_TABLET | Freq: Three times a day (TID) | ORAL | 0 refills | Status: DC | PRN

## 2023-07-19 NOTE — Discharge Instructions (Signed)
 The flu test is negative    Your strep test is negative.  Culture of the throat will be sent, and staff will notify you if that is in turn positive.  Take ibuprofen 600 mg--1 tab every 8 hours as needed for pain.

## 2023-07-19 NOTE — ED Triage Notes (Signed)
 Pt c/o pain in throat (left side) and left ear pain x 3 days. Temp 101.2 last night. She took tylenol around 4am.

## 2023-07-19 NOTE — ED Provider Notes (Signed)
 [facility]    CSN: [id number] Arrival date & time: 07/19/23  1000      History   Chief Complaint Chief Complaint  Patient presents with   Otalgia   Sore Throat    HPI Sharon Garner is a 33 y.o. female.    Otalgia Sore Throat  Here for sore throat and left ear pain.  This began on March 26.  No cough and no nasal congestion or rhinorrhea.  No vomiting or diarrhea.  She has not had any fever or chills.  No known exposures  Temp was 101.2 last night.  NKDA  Past surgical history includes bilateral tubal ligation.  Last menstrual cycle was February 23.  She states that she cannot be pregnant.  Past Medical History:  Diagnosis Date   Family history of intellectual disability 05/16/2012   Kidney stones    kidney stones removed 2021   Migraine    Ovarian cyst    Short interval between pregnancies affecting pregnancy in third trimester, antepartum 01/28/2020    Patient Active Problem List   Diagnosis Date Noted   Round ligament pain 05/04/2020   Chronic migraine without aura, with intractable migraine, so stated, with status migrainosus 11/09/2011   Migraine headache without aura 11/09/2011    Past Surgical History:  Procedure Laterality Date   CESAREAN SECTION N/A 10/17/2012   Procedure: CESAREAN SECTION;  Surgeon: Tereso Newcomer, MD;  Location: [facility];  Service: Obstetrics;  Laterality: N/A;   CESAREAN SECTION N/A 05/11/2019   Procedure: CESAREAN SECTION;  Surgeon: Conan Bowens, MD;  Location: [facility];  Service: Obstetrics;  Laterality: N/A;   CESAREAN SECTION WITH BILATERAL TUBAL LIGATION N/A 09/05/2020   Procedure: CESAREAN SECTION WITH BILATERAL TUBAL LIGATION;  Surgeon: Warden Fillers, MD;  Location: [facility];  Service: Obstetrics;  Laterality: N/A;   KIDNEY STONE SURGERY  2021    OB History     Gravida  6   Para  4   Term  4   Preterm      AB  2   Living  4      SAB  2   IAB      Ectopic      Multiple  0    Live Births  4            Home Medications    Prior to Admission medications   Medication Sig Start Date End Date Taking? Authorizing Provider  acetaminophen (TYLENOL) 325 MG tablet Take 650 mg by mouth every 6 (six) hours as needed for moderate pain.   Yes [provider]  ASHWAGANDHA PO Take 1 capsule by mouth daily.   Yes [provider]  famotidine (PEPCID) 20 MG tablet Take 20 mg by mouth 2 (two) times daily. Patient not taking: Reported on 07/19/2023 10/23/18   [provider]  ferrous sulfate 325 (65 FE) MG tablet Take 1 tablet (325 mg total) by mouth every other day. Patient not taking: Reported on 07/17/2023 09/07/20   Sheila Oats, MD  Galcanezumab-gnlm [facility]) 120 MG/ML SOAJ Inject 120 mg into the skin every 30 (thirty) days. Patient not taking: Reported on 07/17/2023 01/22/22   Lomax, Amy, NP  ibuprofen (ADVIL) 600 MG tablet Take 1 tablet (600 mg total) by mouth every 8 (eight) hours as needed (pain). 07/19/23  Yes Zenia Resides, MD  methylergonovine (METHERGINE) 0.2 MG tablet Take 0.2 mg by mouth 3 (three) times daily. Patient not taking: Reported  on 07/17/2023 05/15/18   [provider]  Rimegepant Sulfate (NURTEC) 75 MG TBDP Take 75 mg (1 tablet) by mouth as needed. The maximum dose in a 24-hour period is 75 mg (1 tablet). Patient not taking: Reported on 07/17/2023 01/24/23   Shawnie Dapper, NP    Family History Family History  Problem Relation Age of Onset   Cancer Mother        reproductive   Migraines Paternal Aunt    Other Neg Hx    Alcohol abuse Neg Hx    Arthritis Neg Hx    Asthma Neg Hx    Birth defects Neg Hx    COPD Neg Hx    Depression Neg Hx    Diabetes Neg Hx    Drug abuse Neg Hx    Early death Neg Hx    Hearing loss Neg Hx    Heart disease Neg Hx    Hyperlipidemia Neg Hx    Hypertension Neg Hx    Kidney disease Neg Hx    Learning disabilities Neg Hx    Mental illness Neg Hx    Mental retardation Neg Hx     Miscarriages / Stillbirths Neg Hx    Stroke Neg Hx    Vision loss Neg Hx     Social History Social History   Tobacco Use   Smoking status: Never   Smokeless tobacco: Never  Vaping Use   Vaping status: Never Used  Substance Use Topics   Alcohol use: No   Drug use: No     Allergies   Patient has no known allergies.   Review of Systems Review of Systems  HENT:  Positive for ear pain.      Physical Exam Triage Vital Signs ED Triage Vitals  Encounter Vitals Group     BP 07/19/23 1041 118/79     Systolic BP Percentile --      Diastolic BP Percentile --      Pulse Rate 07/19/23 1041 75     Resp 07/19/23 1041 16     Temp 07/19/23 1041 98 F (36.7 C)     Temp Source 07/19/23 1041 Oral     SpO2 07/19/23 1041 97 %     Weight --      Height --      Head Circumference --      Peak Flow --      Pain Score 07/19/23 1039 6     Pain Loc --      Pain Education --      Exclude from Growth Chart --    No data found.  Updated Vital Signs BP 118/79 (BP Location: Left Arm)   Pulse 75   Temp 98 F (36.7 C) (Oral)   Resp 16   LMP 06/16/2023   SpO2 97%   Breastfeeding No   Visual Acuity Right Eye Distance:   Left Eye Distance:   Bilateral Distance:    Right Eye Near:   Left Eye Near:    Bilateral Near:     Physical Exam Vitals reviewed.  Constitutional:      General: She is not in acute distress.    Appearance: She is not ill-appearing, toxic-appearing or diaphoretic.  HENT:     Right Ear: Tympanic membrane and ear canal normal.     Left Ear: Tympanic membrane and ear canal normal.     Nose: Nose normal.     Mouth/Throat:     Mouth: Mucous membranes are moist.  Comments: There is mild erythema of the tonsillar pillars and posterior oropharynx. Eyes:     Extraocular Movements: Extraocular movements intact.     Conjunctiva/sclera: Conjunctivae normal.     Pupils: Pupils are equal, round, and reactive to light.  Cardiovascular:     Rate and Rhythm:  Normal rate and regular rhythm.     Heart sounds: No murmur heard. Pulmonary:     Effort: Pulmonary effort is normal. No respiratory distress.     Breath sounds: No stridor. No wheezing, rhonchi or rales.  Musculoskeletal:     Cervical back: Neck supple.  Lymphadenopathy:     Cervical: No cervical adenopathy.  Skin:    Capillary Refill: Capillary refill takes less than 2 seconds.     Coloration: Skin is not jaundiced or pale.  Neurological:     General: No focal deficit present.     Mental Status: She is alert and oriented to person, place, and time.  Psychiatric:        Behavior: Behavior normal.      UC Treatments / Results  Labs (all labs ordered are listed, but only abnormal results are displayed) Labs Reviewed  POCT RAPID STREP A (OFFICE) - Normal  POCT INFLUENZA A/B - Normal  CULTURE, GROUP A STREP [facility])    EKG   Radiology No results found.  Procedures Procedures (including critical care time)  Medications Ordered in UC Medications - No data to display  Initial Impression / Assessment and Plan / UC Course  I have reviewed the triage vital signs and the nursing notes.  Pertinent labs & imaging results that were available during my care of the patient were reviewed by me and considered in my medical decision making (see chart for details).      Flu test is negativ, done since she had fever and sore throat.  Rapid strep is negative.  Throat culture is sent and we will notify and treat protocol if that is positive  Ibuprofen is sent in for her symptoms. Final Clinical Impressions(s) / UC Diagnoses   Final diagnoses:  Throat pain  Acute upper respiratory infection     Discharge Instructions      The flu test is negative    Your strep test is negative.  Culture of the throat will be sent, and staff will notify you if that is in turn positive.  Take ibuprofen 600 mg--1 tab every 8 hours as needed for pain.       ED Prescriptions      Medication Sig Dispense Auth. Provider   ibuprofen (ADVIL) 600 MG tablet Take 1 tablet (600 mg total) by mouth every 8 (eight) hours as needed (pain). 15 tablet Rhoda Waldvogel, Janace Aris, MD      PDMP not reviewed this encounter.   Zenia Resides, MD 07/19/23 [phone]

## 2023-07-22 LAB — CULTURE, GROUP A STREP (THRC)

## 2023-07-25 DIAGNOSIS — F3181 Bipolar II disorder: Secondary | ICD-10-CM | POA: Diagnosis not present

## 2023-07-25 DIAGNOSIS — F4381 Prolonged grief disorder: Secondary | ICD-10-CM | POA: Diagnosis not present

## 2023-07-30 ENCOUNTER — Ambulatory Visit
Admission: EM | Admit: 2023-07-30 | Discharge: 2023-07-30 | Disposition: A | Discharge status: Home / Self Care | Attending: Family Medicine | Admitting: Family Medicine

## 2023-07-30 DIAGNOSIS — R519 Headache, unspecified: Secondary | ICD-10-CM | POA: Diagnosis not present

## 2023-07-30 LAB — POCT URINE PREGNANCY: Preg Test, Ur: NEGATIVE

## 2023-07-30 MED ORDER — ONDANSETRON 4 MG PO TBDP: 4.0000 mg | ORAL_TABLET | Freq: Three times a day (TID) | ORAL | 0 refills | Status: DC | PRN

## 2023-07-30 MED ORDER — SUMATRIPTAN SUCCINATE 6 MG/0.5ML ~~LOC~~ SOLN
6.0000 mg | Freq: Once | SUBCUTANEOUS | Status: AC
  Administered 2023-07-30: 6 mg via SUBCUTANEOUS

## 2023-07-30 MED ORDER — MECLIZINE HCL 25 MG PO TABS: 25.0000 mg | ORAL_TABLET | Freq: Three times a day (TID) | ORAL | 0 refills | Status: AC | PRN

## 2023-07-30 MED ORDER — KETOROLAC TROMETHAMINE 30 MG/ML IJ SOLN
30.0000 mg | Freq: Once | INTRAMUSCULAR | Status: AC
  Administered 2023-07-30: 30 mg via INTRAMUSCULAR

## 2023-07-30 NOTE — ED Triage Notes (Signed)
"  This started yesterday with feeling dizzy, lightheaded (with room spinning), weak with ha (frontal part of my head) making me feel more dizzy, and I feel really hot". No chest pain. No cough. No runny nose. "Some nausea at times, no vomiting".

## 2023-07-30 NOTE — Discharge Instructions (Signed)
 You have been given a shot of Toradol 30 mg today and of sumatriptan.  Take meclizine 25 mg--1 tablet every 8 hours as needed for vertigo  Ondansetron dissolved in the mouth every 8 hours as needed for nausea or vomiting. Clear liquids(water, gatorade/pedialyte, ginger ale/sprite, chicken broth/soup) and bland things(crackers/toast, rice, potato, bananas) to eat. Avoid acidic foods like lemon/lime/orange/tomato, and avoid greasy/spicy foods.  If you do not improve with our treatments, or if you worsen, please go to the emergency room for further evaluation

## 2023-07-30 NOTE — ED Provider Notes (Signed)
 [facility]    CSN: [id number] Arrival date & time: 07/30/23  1908      History   Chief Complaint Chief Complaint  Patient presents with   Dizziness   Weakness    HPI Sharon Garner is a 33 y.o. female.    Dizziness Associated symptoms: weakness   Weakness Associated symptoms: dizziness   Here for headache in the frontal area that is pounding and throbbing.  It began about 3 days ago.  She has also had vertigo and spinning sensation along with some lightheadedness.  She also feels very tired and is nauseated.  No vomiting  No fever  She does have a history of migraines though this is a good bit different from her usual.  NKDA  Last menstrual cycle was February.  She has had her tubes tied.  She does not have any abdominal pain.  Past Medical History:  Diagnosis Date   Family history of intellectual disability 05/16/2012   Kidney stones    kidney stones removed 2021   Migraine    Ovarian cyst    Short interval between pregnancies affecting pregnancy in third trimester, antepartum 01/28/2020    Patient Active Problem List   Diagnosis Date Noted   Prediabetes 07/19/2023   Round ligament pain 05/04/2020   Chronic migraine without aura, with intractable migraine, so stated, with status migrainosus 11/09/2011   Migraine headache without aura 11/09/2011    Past Surgical History:  Procedure Laterality Date   CESAREAN SECTION N/A 10/17/2012   Procedure: CESAREAN SECTION;  Surgeon: Tereso Newcomer, MD;  Location: [facility];  Service: Obstetrics;  Laterality: N/A;   CESAREAN SECTION N/A 05/11/2019   Procedure: CESAREAN SECTION;  Surgeon: Conan Bowens, MD;  Location: [facility];  Service: Obstetrics;  Laterality: N/A;   CESAREAN SECTION WITH BILATERAL TUBAL LIGATION N/A 09/05/2020   Procedure: CESAREAN SECTION WITH BILATERAL TUBAL LIGATION;  Surgeon: Warden Fillers, MD;  Location: [facility];  Service: Obstetrics;  Laterality: N/A;   KIDNEY STONE  SURGERY  2021    OB History     Gravida  6   Para  4   Term  4   Preterm      AB  2   Living  4      SAB  2   IAB      Ectopic      Multiple  0   Live Births  4            Home Medications    Prior to Admission medications   Medication Sig Start Date End Date Taking? Authorizing Provider  CAPLYTA 10.5 MG capsule Take 10.5 mg by mouth daily. 07/26/23  Yes [provider]  FLUoxetine (PROZAC) 10 MG capsule Take 10 mg by mouth daily. 06/13/23  Yes [provider]  LORazepam (ATIVAN) 0.5 MG tablet Take 0.25 mg by mouth 2 (two) times daily as needed. 06/13/23  Yes [provider]  meclizine (ANTIVERT) 25 MG tablet Take 1 tablet (25 mg total) by mouth 3 (three) times daily as needed for dizziness (vertigo). 07/30/23  Yes Zenia Resides, MD  ondansetron (ZOFRAN-ODT) 4 MG disintegrating tablet Take 1 tablet (4 mg total) by mouth every 8 (eight) hours as needed for nausea or vomiting. 07/30/23  Yes Zenia Resides, MD  Prenatal Vit-DSS-Fe Cbn-FA (PRENATAL AD PO) Take by mouth. 05/06/18  Yes [provider]  QUEtiapine (SEROQUEL XR) 50 MG TB24 24 hr tablet Take 50  mg by mouth at bedtime. 07/25/23  Yes [provider]  QUEtiapine (SEROQUEL) 25 MG tablet Take 25 mg by mouth daily. 06/28/23  Yes [provider]  acetaminophen (TYLENOL) 325 MG tablet Take 650 mg by mouth every 6 (six) hours as needed for moderate pain.    [provider]  ASHWAGANDHA PO Take 1 capsule by mouth daily.    [provider]  famotidine (PEPCID) 20 MG tablet Take 20 mg by mouth 2 (two) times daily. Patient not taking: Reported on 07/19/2023 10/23/18   [provider]  ferrous sulfate 325 (65 FE) MG tablet Take 1 tablet (325 mg total) by mouth every other day. Patient not taking: Reported on 07/17/2023 09/07/20   Sheila Oats, MD  Galcanezumab-gnlm [facility]) 120 MG/ML SOAJ Inject 120 mg into the skin every 30 (thirty)  days. Patient not taking: Reported on 07/17/2023 01/22/22   Shawnie Dapper, NP  methylergonovine (METHERGINE) 0.2 MG tablet Take 0.2 mg by mouth 3 (three) times daily. Patient not taking: Reported on 07/17/2023 05/15/18   [provider]  Rimegepant Sulfate (NURTEC) 75 MG TBDP Take 75 mg (1 tablet) by mouth as needed. The maximum dose in a 24-hour period is 75 mg (1 tablet). Patient not taking: Reported on 07/17/2023 01/24/23   Shawnie Dapper, NP    Family History Family History  Problem Relation Age of Onset   Cancer Mother        reproductive   Migraines Paternal Aunt    Other Neg Hx    Alcohol abuse Neg Hx    Arthritis Neg Hx    Asthma Neg Hx    Birth defects Neg Hx    COPD Neg Hx    Depression Neg Hx    Diabetes Neg Hx    Drug abuse Neg Hx    Early death Neg Hx    Hearing loss Neg Hx    Heart disease Neg Hx    Hyperlipidemia Neg Hx    Hypertension Neg Hx    Kidney disease Neg Hx    Learning disabilities Neg Hx    Mental illness Neg Hx    Mental retardation Neg Hx    Miscarriages / Stillbirths Neg Hx    Stroke Neg Hx    Vision loss Neg Hx     Social History Social History   Tobacco Use   Smoking status: Never   Smokeless tobacco: Never  Vaping Use   Vaping status: Never Used  Substance Use Topics   Alcohol use: Not Currently   Drug use: Never     Allergies   Patient has no known allergies.   Review of Systems Review of Systems  Neurological:  Positive for dizziness and weakness.     Physical Exam Triage Vital Signs ED Triage Vitals  Encounter Vitals Group     BP 07/30/23 1921 115/71     Systolic BP Percentile --      Diastolic BP Percentile --      Pulse Rate 07/30/23 1921 77     Resp 07/30/23 1921 18     Temp 07/30/23 1921 98 F (36.7 C)     Temp Source 07/30/23 1921 Oral     SpO2 07/30/23 1921 97 %     Weight 07/30/23 1918 164 lb (74.4 kg)     Height 07/30/23 1918 5\' 1"  (1.549 m)     Head Circumference --      Peak Flow --      Pain Score  07/30/23 1915 7     Pain Loc --      Pain Education --      Exclude from Growth Chart --    No data found.  Updated Vital Signs BP 115/71 (BP Location: Left Arm)   Pulse 77   Temp 98 F (36.7 C) (Oral)   Resp 18   Ht 5\' 1"  (1.549 m)   Wt 74.4 kg   LMP 06/16/2023 (Exact Date)   SpO2 97%   BMI 30.99 kg/m   Visual Acuity Right Eye Distance:   Left Eye Distance:   Bilateral Distance:    Right Eye Near:   Left Eye Near:    Bilateral Near:     Physical Exam Vitals reviewed.  Constitutional:      General: She is not in acute distress.    Appearance: She is not toxic-appearing.  HENT:     Right Ear: Tympanic membrane and ear canal normal.     Left Ear: Tympanic membrane and ear canal normal.     Nose: Nose normal.     Mouth/Throat:     Mouth: Mucous membranes are moist.     Pharynx: No oropharyngeal exudate or posterior oropharyngeal erythema.  Eyes:     Extraocular Movements: Extraocular movements intact.     Conjunctiva/sclera: Conjunctivae normal.     Pupils: Pupils are equal, round, and reactive to light.  Cardiovascular:     Rate and Rhythm: Normal rate and regular rhythm.     Heart sounds: No murmur heard. Pulmonary:     Effort: Pulmonary effort is normal. No respiratory distress.     Breath sounds: No wheezing, rhonchi or rales.  Chest:     Chest wall: No tenderness.  Musculoskeletal:     Cervical back: Neck supple.  Lymphadenopathy:     Cervical: No cervical adenopathy.  Skin:    Capillary Refill: Capillary refill takes less than 2 seconds.     Coloration: Skin is not jaundiced or pale.  Neurological:     General: No focal deficit present.     Mental Status: She is alert and oriented to person, place, and time.     Cranial Nerves: No cranial nerve deficit.     Sensory: No sensory deficit.     Motor: No weakness.     Coordination: Coordination normal.     Gait: Gait normal.     Deep Tendon Reflexes: Reflexes normal.  Psychiatric:        Behavior:  Behavior normal.      UC Treatments / Results  Labs (all labs ordered are listed, but only abnormal results are displayed) Labs Reviewed  POCT URINE PREGNANCY - Normal    EKG   Radiology No results found.  Procedures Procedures (including critical care time)  Medications Ordered in UC Medications  ketorolac (TORADOL) 30 MG/ML injection 30 mg (30 mg Intramuscular Given 07/30/23 2004)  SUMAtriptan (IMITREX) injection 6 mg (6 mg Subcutaneous Given 07/30/23 2003)    Initial Impression / Assessment and Plan / UC Course  I have reviewed the triage vital signs and the nursing notes.  Pertinent labs & imaging results that were available during my care of the patient were reviewed by me and considered in my medical decision making (see chart for details).   UPT was negative, done due to the amount of nausea she is having.  Imitrex and Toradol are given for her headache.  Zofran and meclizine are sent in for her symptoms.  If she worsens  or does not improve with our treatments, she will go to the emergency room for further evaluation Final Clinical Impressions(s) / UC Diagnoses   Final diagnoses:  Acute intractable headache, unspecified headache type     Discharge Instructions      You have been given a shot of Toradol 30 mg today and of sumatriptan.  Take meclizine 25 mg--1 tablet every 8 hours as needed for vertigo  Ondansetron dissolved in the mouth every 8 hours as needed for nausea or vomiting. Clear liquids(water, gatorade/pedialyte, ginger ale/sprite, chicken broth/soup) and bland things(crackers/toast, rice, potato, bananas) to eat. Avoid acidic foods like lemon/lime/orange/tomato, and avoid greasy/spicy foods.  If you do not improve with our treatments, or if you worsen, please go to the emergency room for further evaluation     ED Prescriptions     Medication Sig Dispense Auth. Provider   meclizine (ANTIVERT) 25 MG tablet Take 1 tablet (25 mg total) by mouth  3 (three) times daily as needed for dizziness (vertigo). 30 tablet Gwendola Hornaday, Janace Aris, MD   ondansetron (ZOFRAN-ODT) 4 MG disintegrating tablet Take 1 tablet (4 mg total) by mouth every 8 (eight) hours as needed for nausea or vomiting. 10 tablet Marlinda Mike Janace Aris, MD      PDMP not reviewed this encounter.   Zenia Resides, MD 07/30/23 2019

## 2023-08-29 ENCOUNTER — Ambulatory Visit
Admission: EM | Admit: 2023-08-29 | Discharge: 2023-08-29 | Disposition: A | Discharge status: Home / Self Care | Attending: Family Medicine | Admitting: Family Medicine

## 2023-08-29 ENCOUNTER — Encounter: Payer: Self-pay | Admitting: Emergency Medicine

## 2023-08-29 DIAGNOSIS — J069 Acute upper respiratory infection, unspecified: Secondary | ICD-10-CM | POA: Diagnosis not present

## 2023-08-29 DIAGNOSIS — J029 Acute pharyngitis, unspecified: Secondary | ICD-10-CM

## 2023-08-29 NOTE — ED Provider Notes (Signed)
 [facility]   [id number] 08/29/23 Arrival Time: 7829  ASSESSMENT & PLAN:  1. Viral URI   2. Sore throat    Discussed typical duration of likely viral illness. Work note provided. OTC symptom care as needed.    Discharge Instructions      You may use over the counter ibuprofen  or acetaminophen  as needed.  For a sore throat, over the counter products such as [employer] Peroxyl Mouth Sore Rinse or Chloraseptic Sore Throat Spray may provide some temporary relief.        Follow-up Information     Gavin Kast, FNP.   Specialty: Internal Medicine Why: As needed. Contact information: [address] [place] [address] [phone]         [facility] at [facility]).   Specialty: Urgent Care Why: If worsening or failing to improve as anticipated. Contact information: [address] [place]  [address] [phone]                Reviewed expectations re: course of current medical issues. Questions answered. Outlined signs and symptoms indicating need for more acute intervention. Understanding verbalized. After Visit Summary given.   SUBJECTIVE: History from: Patient. Km Weatherbee is a 33 y.o. female. Pt presents with URI sxs x 2 days. Pt denies emesis and diarrhea. Mild ST and nasal congestion. Denies: fever. Normal PO intake without n/v/d.  OBJECTIVE:  Vitals:   08/29/23 1008  BP: 107/67  Pulse: 74  Resp: 18  Temp: 97.9 F (36.6 C)  TempSrc: Oral  SpO2: 98%  Weight: 74.4 kg    General appearance: alert; no distress Eyes: PERRLA; EOMI; conjunctiva normal HENT: [place]; AT; with nasal congestion; throat with mild cobblestoning Neck: supple  Lungs: speaks full sentences without difficulty; unlabored Extremities: no edema Skin: warm and dry Neurologic: normal gait Psychological: alert and cooperative; normal mood and affect   No Known Allergies  Past Medical  History:  Diagnosis Date   Family history of intellectual disability 05/16/2012   Kidney stones    kidney stones removed 2021   Migraine    Ovarian cyst    Short interval between pregnancies affecting pregnancy in third trimester, antepartum 01/28/2020   Social History   Socioeconomic History   Marital status: Married    Spouse name: Not on file   Number of children: Not on file   Years of education: Not on file   Highest education level: Not on file  Occupational History   Not on file  Tobacco Use   Smoking status: Never   Smokeless tobacco: Never  Vaping Use   Vaping status: Never Used  Substance and Sexual Activity   Alcohol use: Not Currently   Drug use: Never   Sexual activity: Yes    Birth control/protection: None  Other Topics Concern   Not on file  Social History Narrative   Not on file   Social Drivers of Health   Financial Resource Strain: Not on file  Food Insecurity: Not on file  Transportation Needs: Not on file  Physical Activity: Not on file  Stress: Not on file  Social Connections: Not on file  Intimate Partner Violence: Not on file   Family History  Problem Relation Age of Onset   Cancer Mother        reproductive   Migraines Paternal Aunt    Other Neg Hx    Alcohol abuse Neg Hx    Arthritis Neg Hx  Asthma Neg Hx    Birth defects Neg Hx    COPD Neg Hx    Depression Neg Hx    Diabetes Neg Hx    Drug abuse Neg Hx    Early death Neg Hx    Hearing loss Neg Hx    Heart disease Neg Hx    Hyperlipidemia Neg Hx    Hypertension Neg Hx    Kidney disease Neg Hx    Learning disabilities Neg Hx    Mental illness Neg Hx    Mental retardation Neg Hx    Miscarriages / Stillbirths Neg Hx    Stroke Neg Hx    Vision loss Neg Hx    Past Surgical History:  Procedure Laterality Date   CESAREAN SECTION N/A 10/17/2012   Procedure: CESAREAN SECTION;  Surgeon: Julianne Octave, MD;  Location: [facility];  Service: Obstetrics;  Laterality: N/A;   CESAREAN  SECTION N/A 05/11/2019   Procedure: CESAREAN SECTION;  Surgeon: Jan Mcgill, MD;  Location: [facility];  Service: Obstetrics;  Laterality: N/A;   CESAREAN SECTION WITH BILATERAL TUBAL LIGATION N/A 09/05/2020   Procedure: CESAREAN SECTION WITH BILATERAL TUBAL LIGATION;  Surgeon: Abigail Abler, MD;  Location: [facility];  Service: Obstetrics;  Laterality: N/A;   KIDNEY STONE SURGERY  2021     Afton Albright, MD 08/29/23 1049

## 2023-08-29 NOTE — Discharge Instructions (Signed)
 You may use over the counter ibuprofen or acetaminophen as needed.  For a sore throat, over the counter products such as [employer] Peroxyl Mouth Sore Rinse or Chloraseptic Sore Throat Spray may provide some temporary relief.

## 2023-08-29 NOTE — ED Triage Notes (Signed)
 Pt presents with URI sxs x 2 days. Pt denies emesis and diarrhea.

## 2023-09-11 ENCOUNTER — Telehealth: Payer: Self-pay | Admitting: Family Medicine

## 2023-09-11 NOTE — Telephone Encounter (Signed)
 No VM box, sent mychart msg and text informing pt of need to reschedule 11/11/23 appt - NP out

## 2023-11-11 ENCOUNTER — Ambulatory Visit: Payer: Medicaid Other | Admitting: Family Medicine

## 2023-11-17 ENCOUNTER — Emergency Department (HOSPITAL_COMMUNITY)
Admission: EM | Admit: 2023-11-17 | Discharge: 2023-11-17 | Disposition: A | Discharge status: Home / Self Care | Attending: Emergency Medicine | Admitting: Emergency Medicine

## 2023-11-17 ENCOUNTER — Other Ambulatory Visit: Payer: Self-pay

## 2023-11-17 ENCOUNTER — Emergency Department (HOSPITAL_COMMUNITY)

## 2023-11-17 DIAGNOSIS — R519 Headache, unspecified: Secondary | ICD-10-CM | POA: Diagnosis not present

## 2023-11-17 DIAGNOSIS — R509 Fever, unspecified: Secondary | ICD-10-CM | POA: Diagnosis not present

## 2023-11-17 DIAGNOSIS — G43909 Migraine, unspecified, not intractable, without status migrainosus: Secondary | ICD-10-CM | POA: Insufficient documentation

## 2023-11-17 LAB — CBC WITH DIFFERENTIAL/PLATELET
Abs Immature Granulocytes: 0.02 K/uL (ref 0.00–0.07)
Basophils Absolute: 0 K/uL (ref 0.0–0.1)
Basophils Relative: 1 %
Eosinophils Absolute: 0.1 K/uL (ref 0.0–0.5)
Eosinophils Relative: 2 %
HCT: 43.8 % (ref 36.0–46.0)
Hemoglobin: 14.2 g/dL (ref 12.0–15.0)
Immature Granulocytes: 0 %
Lymphocytes Relative: 27 %
Lymphs Abs: 1.9 K/uL (ref 0.7–4.0)
MCH: 29.5 pg (ref 26.0–34.0)
MCHC: 32.4 g/dL (ref 30.0–36.0)
MCV: 91.1 fL (ref 80.0–100.0)
Monocytes Absolute: 0.4 K/uL (ref 0.1–1.0)
Monocytes Relative: 6 %
Neutro Abs: 4.6 K/uL (ref 1.7–7.7)
Neutrophils Relative %: 64 %
Platelets: 387 K/uL (ref 150–400)
RBC: 4.81 MIL/uL (ref 3.87–5.11)
RDW: 12.9 % (ref 11.5–15.5)
WBC: 7.1 K/uL (ref 4.0–10.5)
nRBC: 0 % (ref 0.0–0.2)

## 2023-11-17 LAB — COMPREHENSIVE METABOLIC PANEL WITH GFR
ALT: 20 U/L (ref 0–44)
AST: 20 U/L (ref 15–41)
Albumin: 3.7 g/dL (ref 3.5–5.0)
Alkaline Phosphatase: 53 U/L (ref 38–126)
Anion gap: 7 (ref 5–15)
BUN: 11 mg/dL (ref 6–20)
CO2: 25 mmol/L (ref 22–32)
Calcium: 8.7 mg/dL — ABNORMAL LOW (ref 8.9–10.3)
Chloride: 106 mmol/L (ref 98–111)
Creatinine, Ser: 0.7 mg/dL (ref 0.44–1.00)
GFR, Estimated: >60 mL/min (ref >60)
Glucose, Bld: 114 mg/dL — ABNORMAL HIGH (ref 70–99)
Potassium: 3.9 mmol/L (ref 3.5–5.1)
Sodium: 138 mmol/L (ref 135–145)
Total Bilirubin: 0.8 mg/dL (ref 0.0–1.2)
Total Protein: 7.6 g/dL (ref 6.5–8.1)

## 2023-11-17 LAB — URINALYSIS, ROUTINE W REFLEX MICROSCOPIC
Bilirubin Urine: NEGATIVE
Glucose, UA: NEGATIVE mg/dL
Ketones, ur: NEGATIVE mg/dL
Nitrite: NEGATIVE
Protein, ur: NEGATIVE mg/dL
Specific Gravity, Urine: 1.02 (ref 1.005–1.030)
pH: 5 (ref 5.0–8.0)

## 2023-11-17 LAB — RESP PANEL BY RT-PCR (RSV, FLU A&B, COVID)  RVPGX2
Influenza A by PCR: NEGATIVE
Influenza B by PCR: NEGATIVE
Resp Syncytial Virus by PCR: NEGATIVE
SARS Coronavirus 2 by RT PCR: NEGATIVE

## 2023-11-17 LAB — GROUP A STREP BY PCR: Group A Strep by PCR: NOT DETECTED

## 2023-11-17 LAB — PREGNANCY, URINE: Preg Test, Ur: NEGATIVE

## 2023-11-17 MED ORDER — DIPHENHYDRAMINE HCL 50 MG/ML IJ SOLN
25.0000 mg | Freq: Once | INTRAMUSCULAR | Status: AC
  Administered 2023-11-17: 25 mg via INTRAVENOUS
  Filled 2023-11-17: qty 1

## 2023-11-17 MED ORDER — KETOROLAC TROMETHAMINE 15 MG/ML IJ SOLN
15.0000 mg | Freq: Once | INTRAMUSCULAR | Status: AC
  Administered 2023-11-17: 15 mg via INTRAVENOUS
  Filled 2023-11-17: qty 1

## 2023-11-17 MED ORDER — SODIUM CHLORIDE 0.9 % IV BOLUS
500.0000 mL | Freq: Once | INTRAVENOUS | Status: AC
  Administered 2023-11-17: 500 mL via INTRAVENOUS

## 2023-11-17 MED ORDER — PROCHLORPERAZINE EDISYLATE 10 MG/2ML IJ SOLN
10.0000 mg | Freq: Once | INTRAMUSCULAR | Status: AC
  Administered 2023-11-17: 10 mg via INTRAVENOUS
  Filled 2023-11-17: qty 2

## 2023-11-17 NOTE — ED Provider Notes (Signed)
 [place] EMERGENCY DEPARTMENT AT [facility] Provider Note   CSN: [id number] Arrival date & time: 11/17/23  1305     Patient presents with: Headache   Sharon Garner is a 33 y.o. female.  Patient with past history significant for migraine headaches presents emergency department concerns of left-sided headache.  She reports that she began experiencing a headache yesterday afternoon as well as feelings of being unwell and subjective fever.  Has had a light cough but does not attribute this to any obvious infectious etiology as she denies that the cough is productive.  No sick contacts as far she is aware.  She reports that her migraine headaches typically do present unilaterally but does not typically have manifestations of numbness.  She does not currently follow with neurology for her migraines.   Headache      Prior to Admission medications   Medication Sig Start Date End Date Taking? Authorizing Provider  acetaminophen  (TYLENOL ) 325 MG tablet Take 650 mg by mouth every 6 (six) hours as needed for moderate pain.    [provider]  ASHWAGANDHA PO Take 1 capsule by mouth daily.    [provider]  CAPLYTA 10.5 MG capsule Take 10.5 mg by mouth daily. 07/26/23   [provider]  famotidine  (PEPCID ) 20 MG tablet Take 20 mg by mouth 2 (two) times daily. Patient not taking: Reported on 07/19/2023 10/23/18   [provider]  ferrous sulfate  325 (65 FE) MG tablet Take 1 tablet (325 mg total) by mouth every other day. Patient not taking: Reported on 07/17/2023 09/07/20   Goswick, Anna E, MD  FLUoxetine (PROZAC) 10 MG capsule Take 10 mg by mouth daily. 06/13/23   [provider]  Galcanezumab -gnlm (EMGALITY ) 120 MG/ML SOAJ Inject 120 mg into the skin every 30 (thirty) days. Patient not taking: Reported on 07/17/2023 01/22/22   Lomax, Amy, NP  LORazepam (ATIVAN) 0.5 MG tablet Take 0.25 mg by mouth 2 (two) times daily as needed. 06/13/23    [provider]  meclizine  (ANTIVERT ) 25 MG tablet Take 1 tablet (25 mg total) by mouth 3 (three) times daily as needed for dizziness (vertigo). 07/30/23   Banister, Pamela K, MD  methylergonovine (METHERGINE) 0.2 MG tablet Take 0.2 mg by mouth 3 (three) times daily. Patient not taking: Reported on 07/17/2023 05/15/18   [provider]  ondansetron  (ZOFRAN -ODT) 4 MG disintegrating tablet Take 1 tablet (4 mg total) by mouth every 8 (eight) hours as needed for nausea or vomiting. 07/30/23   Vonna Sharlet POUR, MD  Prenatal Vit-DSS-Fe Cbn-FA (PRENATAL AD PO) Take by mouth. 05/06/18   [provider]  QUEtiapine (SEROQUEL XR) 50 MG TB24 24 hr tablet Take 50 mg by mouth at bedtime. 07/25/23   [provider]  QUEtiapine (SEROQUEL) 25 MG tablet Take 25 mg by mouth daily. 06/28/23   [provider]  Rimegepant Sulfate (NURTEC) 75 MG TBDP Take 75 mg (1 tablet) by mouth as needed. The maximum dose in a 24-hour period is 75 mg (1 tablet). Patient not taking: Reported on 07/17/2023 01/24/23   Lomax, Amy, NP    Allergies: Patient has no known allergies.    Review of Systems  Neurological:  Positive for headaches.  All other systems reviewed and are negative.   Updated Vital Signs BP 129/71 (BP Location: Right Arm)   Pulse (!) 58   Temp 98.2 F (36.8 C) (Oral)   Resp 18   Ht 5' 1 (1.549 m)  Wt 74.8 kg   SpO2 100%   BMI 31.18 kg/m   Physical Exam Vitals and nursing note reviewed.  Constitutional:      General: She is not in acute distress.    Appearance: She is well-developed.  HENT:     Head: Normocephalic and atraumatic.     Right Ear: Hearing, tympanic membrane and ear canal normal.     Left Ear: Hearing, tympanic membrane and ear canal normal.     Mouth/Throat:     Mouth: Mucous membranes are moist. No oral lesions.     Pharynx: No pharyngeal swelling or posterior oropharyngeal erythema.     Tonsils: No tonsillar exudate or tonsillar abscesses. 1+ on  the right. 1+ on the left.  Eyes:     Conjunctiva/sclera: Conjunctivae normal.  Cardiovascular:     Rate and Rhythm: Normal rate and regular rhythm.     Heart sounds: No murmur heard. Pulmonary:     Effort: Pulmonary effort is normal. No respiratory distress.     Breath sounds: Normal breath sounds.  Abdominal:     Palpations: Abdomen is soft.     Tenderness: There is no abdominal tenderness.  Musculoskeletal:        General: No swelling.     Cervical back: Neck supple.  Skin:    General: Skin is warm and dry.     Capillary Refill: Capillary refill takes less than 2 seconds.  Neurological:     Mental Status: She is alert and oriented to person, place, and time.     Cranial Nerves: No cranial nerve deficit.     Comments: CN II-XII intact. No appreciable facial droop, slurred speech, or unilateral weakness or numbness.  Psychiatric:        Mood and Affect: Mood normal.     (all labs ordered are listed, but only abnormal results are displayed) Labs Reviewed  COMPREHENSIVE METABOLIC PANEL WITH GFR - Abnormal; Notable for the following components:      Result Value   Glucose, Bld 114 (*)    Calcium 8.7 (*)    All other components within normal limits  GROUP A STREP BY PCR  RESP PANEL BY RT-PCR (RSV, FLU A&B, COVID)  RVPGX2  CBC WITH DIFFERENTIAL/PLATELET  PREGNANCY, URINE  URINALYSIS, ROUTINE W REFLEX MICROSCOPIC    EKG: None  Radiology: CT Head Wo Contrast Result Date: 11/17/2023 CLINICAL DATA:  Headache, increasing frequency or severity. EXAM: CT HEAD WITHOUT CONTRAST TECHNIQUE: Contiguous axial images were obtained from the base of the skull through the vertex without intravenous contrast. RADIATION DOSE REDUCTION: This exam was performed according to the departmental dose-optimization program which includes automated exposure control, adjustment of the mA and/or kV according to patient size and/or use of iterative reconstruction technique. COMPARISON:  Head MRI 08/19/2021  FINDINGS: Brain: There is no evidence of an acute infarct, intracranial hemorrhage, mass, midline shift, or extra-axial fluid collection. Cerebral volume is normal. The ventricles are normal in size. The cerebellar tonsils are normally positioned. Vascular: No hyperdense vessel. Skull: No fracture or suspicious lesion. Sinuses/Orbits: Visualized paranasal sinuses and mastoid air cells are clear. Unremarkable orbits. Other: None. IMPRESSION: Negative head CT. Electronically Signed   By: Dasie Hamburg M.D.   On: 11/17/2023 15:56     Procedures   Medications Ordered in the ED  prochlorperazine  (COMPAZINE ) injection 10 mg (10 mg Intravenous Given 11/17/23 1617)  ketorolac  (TORADOL ) 15 MG/ML injection 15 mg (15 mg Intravenous Given 11/17/23 1617)  diphenhydrAMINE  (BENADRYL ) injection 25 mg (  25 mg Intravenous Given 11/17/23 1616)  sodium chloride  0.9 % bolus 500 mL (0 mLs Intravenous Stopped 11/17/23 1703)                                    Medical Decision Making Amount and/or Complexity of Data Reviewed Labs: ordered. Radiology: ordered.  Risk Prescription drug management.   This patient presents to the ED for concern of headache, this involves an extensive number of treatment options, and is a complaint that carries with it a high risk of complications and morbidity.  The differential diagnosis includes pancreatic, viral URI, bronchitis, strep pharyngitis   Co morbidities that complicate the patient evaluation  Migraine headache   Lab Tests:  I Ordered, and personally interpreted labs.  The pertinent results include: CBC unremarkable, CMP unremarkable, UA pending but given patient is asymptomatic, no indication for treatment, urine pregnancy negative, group A strep negative, respiratory panel negative for COVID, reportedly, history   Imaging Studies ordered:  I ordered imaging studies including CT head  I independently visualized and interpreted imaging which showed negative for any  acute abnormalities I agree with the radiologist interpretation   Consultations Obtained:  I requested consultation with none,  and discussed lab and imaging findings as well as pertinent plan - they recommend: N/A   Problem List / ED Course / Critical interventions / Medication management  Patient has history significant for migraine headaches presents to the emergency department today with concerns of a headache.  She reports that she began experience headache starting yesterday afternoon with some associated feelings of numbness on her left cheek.  She reports her headaches typically are unilateral but the feeling of subjective numbness is somewhat abnormal for her.  She does take some medications at home for migraines but reports no improvement in the last day.  Reports objective fevers yesterday but denies any sick contacts.  Has had a light cough but denies any other symptoms such as shortness of breath, nasal congestion, nausea, vomiting, or diarrhea. Physical exam is unremarkable.  Unilateral symptoms are absent.  No facial droop or slurred speech.  Cranial nerves II through XII intact. Patient's workup is reassuring.  No abnormal findings on labs or imaging.  I suspect her symptoms are likely secondary to migraine headache. On reevaluation after administration of migraine cocktail which consisted of Compazine , Toradol , Benadryl , and fluids, she reports large resolution in her headache and no longer endorsing the subjective tingling feeling on her left side face.  I suspect this was likely a complex migraine.  Will discharge home with instructions for follow-up with neurology.  Return precautions discussed such as concerns for new or worsening symptoms.  Otherwise stable for outpatient follow-up and discharged home. I ordered medication including fluids, Compazine , Toradol , Benadryl  for migraine cocktail Reevaluation of the patient after these medicines showed that the patient improved I  have reviewed the patients home medicines and have made adjustments as needed   Social Determinants of Health:  History of migraine headache   Test / Admission - Considered:  Stable for outpatient follow-up.  Final diagnoses:  Migraine without status migrainosus, not intractable, unspecified migraine type    ED Discharge Orders     None          Cecily Legrand DELENA DEVONNA 11/17/23 1722    Cottie Donnice PARAS, MD 11/17/23 2026

## 2023-11-17 NOTE — Discharge Instructions (Signed)
 You were seen in the emergency department today for concerns of headache.  Your labs and imaging were thankfully reassuring with no abnormal findings seen.  You did respond well to migraine medication.  Please reach out to her neurologist for further evaluation regarding management of your migraine headaches.  For any concerns of new or worsening symptoms, return to the emergency department.

## 2023-11-17 NOTE — ED Triage Notes (Signed)
 Pt reports to the ED for headache that started yesterday afternoon. Pt states that it is on her left side and feels some heaviness on her left cheek. Pt is alert and oriented x4. No history of strokes.

## 2023-11-23 ENCOUNTER — Ambulatory Visit
Admission: EM | Admit: 2023-11-23 | Discharge: 2023-11-23 | Disposition: A | Discharge status: Home / Self Care | Attending: Internal Medicine | Admitting: Internal Medicine

## 2023-11-23 DIAGNOSIS — H66002 Acute suppurative otitis media without spontaneous rupture of ear drum, left ear: Secondary | ICD-10-CM | POA: Diagnosis not present

## 2023-11-23 DIAGNOSIS — G43909 Migraine, unspecified, not intractable, without status migrainosus: Secondary | ICD-10-CM

## 2023-11-23 MED ORDER — KETOROLAC TROMETHAMINE 30 MG/ML IJ SOLN
30.0000 mg | Freq: Once | INTRAMUSCULAR | Status: AC
  Administered 2023-11-23: 30 mg via INTRAMUSCULAR

## 2023-11-23 MED ORDER — DEXAMETHASONE SODIUM PHOSPHATE 10 MG/ML IJ SOLN
10.0000 mg | Freq: Once | INTRAMUSCULAR | Status: AC
  Administered 2023-11-23: 10 mg via INTRAMUSCULAR

## 2023-11-23 MED ORDER — AMOXICILLIN 875 MG PO TABS: 875.0000 mg | ORAL_TABLET | Freq: Two times a day (BID) | ORAL | 0 refills | Status: AC

## 2023-11-23 NOTE — ED Triage Notes (Signed)
 I started yesterday with left ear pain and a small ha but now much stronger ha with ear pain. No cough. No runny nose. No fever. No discharge from ear. No trauma (known).

## 2023-11-23 NOTE — Discharge Instructions (Addendum)
 Symptoms and physical exam findings are consistent with a left otitis media (ear infection).  This is likely leading to the headache that is triggering a migraine.  Neurologically the exam is reassuring.  We will treat for the ear infection and also do medication today to help with the headache and hopefully prevent a full-blown migraine.  We will treat with the following: Toradol  injection given today. This is a medication to help with pain. This is not a narcotic.  Decadron  injection given today. This is a steroid to help with inflammation and pain. Amoxicillin  875 mg twice daily for 10 days.  This is an antibiotic.  Take this with food.  Rest and stay hydrated.  Return to urgent care or PCP if symptoms worsen or fail to resolve.

## 2023-11-23 NOTE — ED Provider Notes (Signed)
 [facility]    CSN: [id number] Arrival date & time: 11/23/23  1043      History   Chief Complaint Chief Complaint  Patient presents with   Headache   Otalgia    HPI Sharon Garner is a 33 y.o. female.   33 year old female who presents urgent care with complaints of left ear pain and headache.  She reports that last night it started with left ear pain.  This was associated with a mild headache.  She woke up this morning with much worse left ear pain and pain along the left side of the head.  She denies any fevers, chills, cough, congestion, shortness of breath, visual changes, slurred speech.  She does report that earlier this week she was at the emergency room secondary to a migraine headache which she is prone to.   Headache Associated symptoms: ear pain   Associated symptoms: no abdominal pain, no back pain, no cough, no dizziness, no eye pain, no fever, no seizures, no sore throat, no vomiting and no weakness   Otalgia Associated symptoms: headaches   Associated symptoms: no abdominal pain, no cough, no fever, no rash, no sore throat and no vomiting     Past Medical History:  Diagnosis Date   Family history of intellectual disability 05/16/2012   Kidney stones    kidney stones removed 2021   Migraine    Ovarian cyst    Short interval between pregnancies affecting pregnancy in third trimester, antepartum 01/28/2020    Patient Active Problem List   Diagnosis Date Noted   Prediabetes 07/19/2023   Round ligament pain 05/04/2020   Chronic migraine without aura, with intractable migraine, so stated, with status migrainosus 11/09/2011   Migraine headache without aura 11/09/2011    Past Surgical History:  Procedure Laterality Date   CESAREAN SECTION N/A 10/17/2012   Procedure: CESAREAN SECTION;  Surgeon: Gloris DELENA Hugger, MD;  Location: [facility];  Service: Obstetrics;  Laterality: N/A;   CESAREAN SECTION N/A 05/11/2019   Procedure: CESAREAN SECTION;   Surgeon: Nicholaus Burnard HERO, MD;  Location: [facility];  Service: Obstetrics;  Laterality: N/A;   CESAREAN SECTION WITH BILATERAL TUBAL LIGATION N/A 09/05/2020   Procedure: CESAREAN SECTION WITH BILATERAL TUBAL LIGATION;  Surgeon: Zina Jerilynn DELENA, MD;  Location: [facility];  Service: Obstetrics;  Laterality: N/A;   KIDNEY STONE SURGERY  2021    OB History     Gravida  6   Para  4   Term  4   Preterm      AB  2   Living  4      SAB  2   IAB      Ectopic      Multiple      Live Births  4            Home Medications    Prior to Admission medications   Medication Sig Start Date End Date Taking? Authorizing Provider  acetaminophen  (TYLENOL ) 325 MG tablet Take 650 mg by mouth every 6 (six) hours as needed for moderate pain.    [provider]  amoxicillin  (AMOXIL ) 875 MG tablet Take 1 tablet (875 mg total) by mouth 2 (two) times daily for 10 days. 11/23/23 12/03/23 Yes Deandrew Hoecker A, PA-C  ASHWAGANDHA PO Take 1 capsule by mouth daily.    [provider]  CAPLYTA 10.5 MG capsule Take 10.5 mg by mouth daily. 07/26/23   [provider]  famotidine  (PEPCID ) 20  MG tablet Take 20 mg by mouth 2 (two) times daily. Patient not taking: Reported on 07/19/2023 10/23/18   [provider]  ferrous sulfate  325 (65 FE) MG tablet Take 1 tablet (325 mg total) by mouth every other day. Patient not taking: Reported on 07/17/2023 09/07/20   Goswick, Anna E, MD  FLUoxetine (PROZAC) 10 MG capsule Take 10 mg by mouth daily. 06/13/23   [provider]  Galcanezumab -gnlm (EMGALITY ) 120 MG/ML SOAJ Inject 120 mg into the skin every 30 (thirty) days. Patient not taking: Reported on 07/17/2023 01/22/22   Lomax, Amy, NP  LORazepam (ATIVAN) 0.5 MG tablet Take 0.25 mg by mouth 2 (two) times daily as needed. 06/13/23   [provider]  meclizine  (ANTIVERT ) 25 MG tablet Take 1 tablet (25 mg total) by mouth 3 (three) times daily as needed for dizziness (vertigo).  07/30/23   Banister, Pamela K, MD  methylergonovine (METHERGINE) 0.2 MG tablet Take 0.2 mg by mouth 3 (three) times daily. Patient not taking: Reported on 07/17/2023 05/15/18   [provider]  ondansetron  (ZOFRAN -ODT) 4 MG disintegrating tablet Take 1 tablet (4 mg total) by mouth every 8 (eight) hours as needed for nausea or vomiting. 07/30/23   Vonna Sharlet POUR, MD  Prenatal Vit-DSS-Fe Cbn-FA (PRENATAL AD PO) Take by mouth. 05/06/18   [provider]  QUEtiapine (SEROQUEL XR) 50 MG TB24 24 hr tablet Take 50 mg by mouth at bedtime. 07/25/23   [provider]  QUEtiapine (SEROQUEL) 25 MG tablet Take 25 mg by mouth daily. 06/28/23   [provider]  Rimegepant Sulfate (NURTEC) 75 MG TBDP Take 75 mg (1 tablet) by mouth as needed. The maximum dose in a 24-hour period is 75 mg (1 tablet). Patient not taking: Reported on 07/17/2023 01/24/23   Cary No, NP    Family History Family History  Problem Relation Age of Onset   Cancer Mother        reproductive   Migraines Paternal Aunt    Other Neg Hx    Alcohol abuse Neg Hx    Arthritis Neg Hx    Asthma Neg Hx    Birth defects Neg Hx    COPD Neg Hx    Depression Neg Hx    Diabetes Neg Hx    Drug abuse Neg Hx    Early death Neg Hx    Hearing loss Neg Hx    Heart disease Neg Hx    Hyperlipidemia Neg Hx    Hypertension Neg Hx    Kidney disease Neg Hx    Learning disabilities Neg Hx    Mental illness Neg Hx    Mental retardation Neg Hx    Miscarriages / Stillbirths Neg Hx    Stroke Neg Hx    Vision loss Neg Hx     Social History Social History   Tobacco Use   Smoking status: Never   Smokeless tobacco: Never  Vaping Use   Vaping status: Never Used  Substance Use Topics   Alcohol use: Not Currently   Drug use: Never     Allergies   Patient has no known allergies.   Review of Systems Review of Systems  Constitutional:  Negative for chills and fever.  HENT:  Positive for ear pain. Negative for sore  throat.   Eyes:  Negative for pain and visual disturbance.  Respiratory:  Negative for cough and shortness of breath.   Cardiovascular:  Negative for chest pain and palpitations.  Gastrointestinal:  Negative  for abdominal pain and vomiting.  Genitourinary:  Negative for dysuria and hematuria.  Musculoskeletal:  Negative for arthralgias and back pain.  Skin:  Negative for color change and rash.  Neurological:  Positive for headaches. Negative for dizziness, seizures, syncope, facial asymmetry and weakness.  All other systems reviewed and are negative.    Physical Exam Triage Vital Signs ED Triage Vitals  Encounter Vitals Group     BP 11/23/23 1054 120/76     Girls Systolic BP Percentile --      Girls Diastolic BP Percentile --      Boys Systolic BP Percentile --      Boys Diastolic BP Percentile --      Pulse Rate 11/23/23 1054 70     Resp 11/23/23 1054 18     Temp 11/23/23 1054 98.1 F (36.7 C)     Temp Source 11/23/23 1054 Oral     SpO2 11/23/23 1054 99 %     Weight 11/23/23 1052 160 lb (72.6 kg)     Height 11/23/23 1052 5' 1 (1.549 m)     Head Circumference --      Peak Flow --      Pain Score 11/23/23 1050 8     Pain Loc --      Pain Education --      Exclude from Growth Chart --    No data found.  Updated Vital Signs BP 120/76 (BP Location: Left Arm)   Pulse 70   Temp 98.1 F (36.7 C) (Oral)   Resp 18   Ht 5' 1 (1.549 m)   Wt 160 lb (72.6 kg)   LMP 11/18/2023 (Exact Date)   SpO2 99%   BMI 30.23 kg/m   Visual Acuity Right Eye Distance:   Left Eye Distance:   Bilateral Distance:    Right Eye Near:   Left Eye Near:    Bilateral Near:     Physical Exam Vitals and nursing note reviewed.  Constitutional:      General: She is not in acute distress.    Appearance: She is well-developed.  HENT:     Head: Normocephalic and atraumatic.     Right Ear: Tympanic membrane normal.     Left Ear: A middle ear effusion is present. Tympanic membrane is  erythematous.  Eyes:     Conjunctiva/sclera: Conjunctivae normal.  Cardiovascular:     Rate and Rhythm: Normal rate and regular rhythm.     Heart sounds: No murmur heard. Pulmonary:     Effort: Pulmonary effort is normal. No respiratory distress.     Breath sounds: Normal breath sounds.  Abdominal:     Palpations: Abdomen is soft.     Tenderness: There is no abdominal tenderness.  Musculoskeletal:        General: No swelling.     Cervical back: Neck supple.  Skin:    General: Skin is warm and dry.     Capillary Refill: Capillary refill takes less than 2 seconds.  Neurological:     Mental Status: She is alert.     Cranial Nerves: No cranial nerve deficit.     Coordination: Coordination normal.     Gait: Gait normal.  Psychiatric:        Mood and Affect: Mood normal.        Speech: Speech normal.        Behavior: Behavior normal.      UC Treatments / Results  Labs (all labs ordered are listed, but  only abnormal results are displayed) Labs Reviewed - No data to display  EKG   Radiology No results found.  Procedures Procedures (including critical care time)  Medications Ordered in UC Medications  ketorolac  (TORADOL ) 30 MG/ML injection 30 mg (has no administration in time range)  dexamethasone  (DECADRON ) injection 10 mg (has no administration in time range)    Initial Impression / Assessment and Plan / UC Course  I have reviewed the triage vital signs and the nursing notes.  Pertinent labs & imaging results that were available during my care of the patient were reviewed by me and considered in my medical decision making (see chart for details).     Non-recurrent acute suppurative otitis media of left ear without spontaneous rupture of tympanic membrane  Migraine without status migrainosus, not intractable, unspecified migraine type   Symptoms and physical exam findings are consistent with a left otitis media (ear infection).  This is likely leading to the  headache that is triggering a migraine.  Neurologically the exam is reassuring.  We will treat for the ear infection and also do medication today to help with the headache and hopefully prevent a full-blown migraine.  We will treat with the following: Toradol  injection given today. This is a medication to help with pain. This is not a narcotic.  Decadron  injection given today. This is a steroid to help with inflammation and pain. Amoxicillin  875 mg twice daily for 10 days.  This is an antibiotic.  Take this with food.  Rest and stay hydrated.  Return to urgent care or PCP if symptoms worsen or fail to resolve.    Final Clinical Impressions(s) / UC Diagnoses   Final diagnoses:  Non-recurrent acute suppurative otitis media of left ear without spontaneous rupture of tympanic membrane  Migraine without status migrainosus, not intractable, unspecified migraine type     Discharge Instructions      Symptoms and physical exam findings are consistent with a left otitis media (ear infection).  This is likely leading to the headache that is triggering a migraine.  Neurologically the exam is reassuring.  We will treat for the ear infection and also do medication today to help with the headache and hopefully prevent a full-blown migraine.  We will treat with the following: Toradol  injection given today. This is a medication to help with pain. This is not a narcotic.  Decadron  injection given today. This is a steroid to help with inflammation and pain. Amoxicillin  875 mg twice daily for 10 days.  This is an antibiotic.  Take this with food.  Rest and stay hydrated.  Return to urgent care or PCP if symptoms worsen or fail to resolve.      ED Prescriptions     Medication Sig Dispense Auth. Provider   amoxicillin  (AMOXIL ) 875 MG tablet Take 1 tablet (875 mg total) by mouth 2 (two) times daily for 10 days. 20 tablet Teresa Almarie LABOR, [place]      PDMP not reviewed this encounter.   Teresa Almarie LABOR, PA-C 11/23/23 1223

## 2023-11-26 DIAGNOSIS — H5213 Myopia, bilateral: Secondary | ICD-10-CM | POA: Diagnosis not present

## 2023-12-19 ENCOUNTER — Telehealth: Payer: Self-pay

## 2023-12-19 NOTE — Telephone Encounter (Signed)
 Pharmacy Patient Advocate Encounter   Received notification from Fax that prior authorization for Emgality  is due for renewal.   Insurance verification completed.   The patient is insured through N/A.  Action: Patient hasn't been seen in your office in over a year. Plan requires updated chart notes for PA renewal.

## 2024-01-02 DIAGNOSIS — R42 Dizziness and giddiness: Secondary | ICD-10-CM | POA: Diagnosis not present

## 2024-01-02 DIAGNOSIS — Z1329 Encounter for screening for other suspected endocrine disorder: Secondary | ICD-10-CM | POA: Diagnosis not present

## 2024-01-02 DIAGNOSIS — F419 Anxiety disorder, unspecified: Secondary | ICD-10-CM | POA: Diagnosis not present

## 2024-01-02 DIAGNOSIS — R7303 Prediabetes: Secondary | ICD-10-CM | POA: Diagnosis not present

## 2024-01-02 DIAGNOSIS — R1030 Lower abdominal pain, unspecified: Secondary | ICD-10-CM | POA: Diagnosis not present

## 2024-01-02 DIAGNOSIS — R112 Nausea with vomiting, unspecified: Secondary | ICD-10-CM | POA: Diagnosis not present

## 2024-01-22 ENCOUNTER — Telehealth: Payer: Self-pay | Admitting: Internal Medicine

## 2024-01-22 ENCOUNTER — Ambulatory Visit: Payer: Self-pay

## 2024-01-22 ENCOUNTER — Ambulatory Visit: Admission: EM | Admit: 2024-01-22 | Discharge: 2024-01-22 | Disposition: A | Discharge status: Home / Self Care

## 2024-01-22 DIAGNOSIS — G43019 Migraine without aura, intractable, without status migrainosus: Secondary | ICD-10-CM

## 2024-01-22 DIAGNOSIS — G43711 Chronic migraine without aura, intractable, with status migrainosus: Secondary | ICD-10-CM

## 2024-01-22 MED ORDER — KETOROLAC TROMETHAMINE 15 MG/ML IJ SOLN
30.0000 mg | Freq: Once | INTRAMUSCULAR | Status: AC
  Administered 2024-01-22: 30 mg via INTRAMUSCULAR

## 2024-01-22 MED ORDER — ONDANSETRON 8 MG PO TBDP: 8.0000 mg | ORAL_TABLET | Freq: Three times a day (TID) | ORAL | 0 refills | Status: AC | PRN

## 2024-01-22 MED ORDER — KETOROLAC TROMETHAMINE 30 MG/ML IJ SOLN: 30.0000 mg | Freq: Once | INTRAMUSCULAR | Status: DC

## 2024-01-22 MED ORDER — NURTEC 75 MG PO TBDP: ORAL_TABLET | ORAL | 0 refills | Status: AC

## 2024-01-22 MED ORDER — METOCLOPRAMIDE HCL 5 MG/ML IJ SOLN
10.0000 mg | Freq: Once | INTRAMUSCULAR | Status: AC
  Administered 2024-01-22: 10 mg via INTRAMUSCULAR

## 2024-01-22 NOTE — ED Triage Notes (Signed)
 After careful review of chart with patient and PCP notes/information. Patient does have PCP [facility]) and follow up visit appt made.

## 2024-01-22 NOTE — Telephone Encounter (Signed)
 FYI Only or Action Required?: FYI only for provider.  Patient was last seen in primary care on 07/17/2023 by Billy Knee, FNP.  Called Nurse Triage reporting Headache.  Symptoms began a week ago.  Interventions attempted: OTC medications: Tylenol .  Symptoms are: unchanged.  Triage Disposition: See Physician Within 24 Hours  Patient/caregiver understands and will follow disposition?: Yes  **Appt. Scheduled for 10/2**            Reason for Disposition  [1] MODERATE headache (e.g., interferes with normal activities) AND [2] present > 24 hours AND [3] unexplained  (Exceptions: Pain medicines not tried, typical migraine, or headache part of viral illness.)  Answer Assessment - Initial Assessment Questions 1. LOCATION: Where does it hurt?       Left side, throbbing toward eyes. She reports feeling hot in on that side of her face    2. ONSET: When did the headache start? (e.g., minutes, hours, days)      X 1 week   3. PATTERN: Does the pain come and go, or has it been constant since it started?     Intermittent   4. SEVERITY: How bad is the pain? and What does it keep you from doing?  (e.g., Scale 1-10; mild, moderate, or severe)     Moderate-Severe  5. RECURRENT SYMPTOM: Have you ever had headaches before? If Yes, ask: When was the last time? and What happened that time?      Yes ongoing 15 years   6. CAUSE: What do you think is causing the headache?     Hx. Of Migraines   7. MIGRAINE: Have you been diagnosed with migraine headaches? If Yes, ask: Is this headache similar?      Yes  8. HEAD INJURY: Has there been any recent injury to your head?      No   9. OTHER SYMPTOMS: Do you have any other symptoms? (e.g., fever, stiff neck, eye pain, sore throat, cold symptoms)     No   10. PREGNANCY: Is there any chance you are pregnant? When was your last menstrual period?       No   She is taking Tylenol  for the headaches.  Protocols  used: [facility]

## 2024-01-22 NOTE — ED Triage Notes (Addendum)
 Patient reports headache onset around 2:30 PM yesterday, localized to the left side of the head and face, with pain intensifying by 4:00 PM. Took Tylenol  (1500 mg) at approximately 3:30 PM prior to worsening of symptoms. Pain is consistently unilateral on the left side during episodes. Last dose of Tylenol  was at 9:00 AM today. Patient has a history of significant migraines. Neurology appointment is scheduled for October (details unconfirmed in EPIC).

## 2024-01-22 NOTE — ED Notes (Signed)
 Note: Patient declined staying after injections. Medications given in past, no reaction.

## 2024-01-22 NOTE — ED Provider Notes (Signed)
 [facility]    CSN: [id number] Arrival date & time: 01/22/24  1400      History   Chief Complaint Chief Complaint  Patient presents with   Migraine    HPI Sharon Garner is a 33 y.o. female.   Discussed the use of AI scribe software for clinical note transcription with the patient, who gave verbal consent to proceed.   The patient, with a history of migraines, presents with persistent headaches that have been ongoing since late July 2025. She reports that the current episode has been intermittent over the past three to four days. The headache is described as similar to her prior migraines, typically unilateral. At present, it is left-sided, involving the forehead, behind the eye, and extending to the posterior neck. The pain is characterized as significant pressure with a hot sensation. Associated symptoms include one episode of dizziness today, nausea without vomiting, and both photophobia and phonophobia.  She has attempted symptomatic relief with Tylenol  but is not currently taking any prescribed migraine medications. She previously used Nurtec, which initially provided benefit but lost effectiveness with continued use, and she no longer has the medication. She was prescribed Emgality  in the past but has not used it in more than five months. The last formal neurologist appointment was in October 2023. She contacted her neurologist's office recently for an Emgality  refill but was informed she must be re-evaluated since it has been over a year since her last visit. She did receive Toradol  and IV fluids in January 2024 at the neurology office for acute symptom management, though she was not evaluated by a provider at that time.  Recent healthcare encounters include an emergency department visit on 11/17/23, where she was treated with medications and diagnosed with migraine; labs and imaging were unremarkable. She was seen in urgent care on 11/23/23, again diagnosed with  migraine and treated with a Toradol  injection. Earlier today, she contacted her PCP office, where she was given an appointment for tomorrow for evaluation of her headaches. She presents here today for further evaluation.  The following sections of the patient's history were reviewed and updated as appropriate: allergies, current medications, past family history, past medical history, past social history, past surgical history, and problem list.        Past Medical History:  Diagnosis Date   Family history of intellectual disability 05/16/2012   Kidney stones    kidney stones removed 2021   Migraine    Ovarian cyst    Short interval between pregnancies affecting pregnancy in third trimester, antepartum 01/28/2020    Patient Active Problem List   Diagnosis Date Noted   Prediabetes 07/19/2023   Round ligament pain 05/04/2020   Cholecystitis 08/03/2019   Chronic migraine without aura, with intractable migraine, so stated, with status migrainosus 11/09/2011   Migraine headache without aura 11/09/2011    Past Surgical History:  Procedure Laterality Date   CESAREAN SECTION N/A 10/17/2012   Procedure: CESAREAN SECTION;  Surgeon: Gloris DELENA Hugger, MD;  Location: [facility];  Service: Obstetrics;  Laterality: N/A;   CESAREAN SECTION N/A 05/11/2019   Procedure: CESAREAN SECTION;  Surgeon: Nicholaus Burnard HERO, MD;  Location: [facility];  Service: Obstetrics;  Laterality: N/A;   CESAREAN SECTION WITH BILATERAL TUBAL LIGATION N/A 09/05/2020   Procedure: CESAREAN SECTION WITH BILATERAL TUBAL LIGATION;  Surgeon: Zina Jerilynn DELENA, MD;  Location: [facility];  Service: Obstetrics;  Laterality: N/A;   KIDNEY STONE SURGERY  2021    OB History  Gravida  6   Para  4   Term  4   Preterm      AB  2   Living  4      SAB  2   IAB      Ectopic      Multiple      Live Births  4            Home Medications    Prior to Admission medications   Medication Sig Start Date End Date  Taking? Authorizing Provider  acetaminophen  (TYLENOL ) 325 MG tablet Take 1,500 mg by mouth every 6 (six) hours as needed for moderate pain (pain score 4-6).   Yes [provider]  HYDROcodone -acetaminophen  (NORCO/VICODIN) 5-325 MG tablet Take 1-2 tablets by mouth every 8 (eight) hours. 08/04/19  Yes [provider]  ondansetron  (ZOFRAN -ODT) 8 MG disintegrating tablet Take 1 tablet (8 mg total) by mouth every 8 (eight) hours as needed for nausea or vomiting. 01/22/24  Yes Iola Lukes, FNP  ASHWAGANDHA PO Take 1 capsule by mouth daily.    [provider]  CAPLYTA 10.5 MG capsule Take 10.5 mg by mouth daily. 07/26/23   [provider]  famotidine  (PEPCID ) 20 MG tablet Take 20 mg by mouth 2 (two) times daily. Patient not taking: Reported on 07/19/2023 10/23/18   [provider]  ferrous sulfate  325 (65 FE) MG tablet Take 1 tablet (325 mg total) by mouth every other day. Patient not taking: Reported on 07/17/2023 09/07/20   Goswick, Anna E, MD  FLUoxetine (PROZAC) 10 MG capsule Take 10 mg by mouth daily. 06/13/23   [provider]  Galcanezumab -gnlm (EMGALITY ) 120 MG/ML SOAJ Inject 120 mg into the skin every 30 (thirty) days. Patient not taking: Reported on 07/17/2023 01/22/22   Lomax, Amy, NP  LORazepam (ATIVAN) 0.5 MG tablet Take 0.25 mg by mouth 2 (two) times daily as needed. 06/13/23   [provider]  meclizine  (ANTIVERT ) 25 MG tablet Take 1 tablet (25 mg total) by mouth 3 (three) times daily as needed for dizziness (vertigo). 07/30/23   Banister, Pamela K, MD  methylergonovine (METHERGINE) 0.2 MG tablet Take 0.2 mg by mouth 3 (three) times daily. Patient not taking: Reported on 07/17/2023 05/15/18   [provider]  Prenatal Vit-DSS-Fe Cbn-FA (PRENATAL AD PO) Take by mouth. 05/06/18   [provider]  QUEtiapine (SEROQUEL XR) 50 MG TB24 24 hr tablet Take 50 mg by mouth at bedtime. 07/25/23   [provider]  QUEtiapine  (SEROQUEL) 25 MG tablet Take 25 mg by mouth daily. 06/28/23   [provider]  Rimegepant Sulfate (NURTEC) 75 MG TBDP Take 75 mg (1 tablet) by mouth as needed for migraine. Do not take more than 75 mg (1 tablet) in a 24-hour period 01/22/24   Iola Lukes, FNP    Family History Family History  Problem Relation Age of Onset   Cancer Mother        reproductive   Migraines Paternal Aunt    Other Neg Hx    Alcohol abuse Neg Hx    Arthritis Neg Hx    Asthma Neg Hx    Birth defects Neg Hx    COPD Neg Hx    Depression Neg Hx    Diabetes Neg Hx    Drug abuse Neg Hx    Early death Neg Hx    Hearing loss Neg Hx    Heart disease Neg Hx    Hyperlipidemia Neg Hx  Hypertension Neg Hx    Kidney disease Neg Hx    Learning disabilities Neg Hx    Mental illness Neg Hx    Mental retardation Neg Hx    Miscarriages / Stillbirths Neg Hx    Stroke Neg Hx    Vision loss Neg Hx     Social History Social History   Tobacco Use   Smoking status: Never   Smokeless tobacco: Never  Vaping Use   Vaping status: Never Used  Substance Use Topics   Alcohol use: Not Currently   Drug use: Never     Allergies   Patient has no known allergies.   Review of Systems Review of Systems  HENT:         + phonophobia   Eyes:  Positive for photophobia. Negative for visual disturbance.  Gastrointestinal:  Positive for nausea. Negative for vomiting.  Musculoskeletal:  Positive for neck pain. Negative for neck stiffness.  Neurological:  Positive for dizziness (once today only) and headaches. Negative for numbness.  All other systems reviewed and are negative.    Physical Exam Triage Vital Signs ED Triage Vitals  Encounter Vitals Group     BP 01/22/24 1504 117/79     Girls Systolic BP Percentile --      Girls Diastolic BP Percentile --      Boys Systolic BP Percentile --      Boys Diastolic BP Percentile --      Pulse Rate 01/22/24 1504 73     Resp 01/22/24 1504 18     Temp  01/22/24 1504 98.6 F (37 C)     Temp Source 01/22/24 1504 Oral     SpO2 01/22/24 1504 96 %     Weight 01/22/24 1501 164 lb (74.4 kg)     Height 01/22/24 1501 5' 1 (1.549 m)     Head Circumference --      Peak Flow --      Pain Score 01/22/24 1455 8     Pain Loc --      Pain Education --      Exclude from Growth Chart --    No data found.  Updated Vital Signs BP 117/79 (BP Location: Left Arm)   Pulse 73   Temp 98.6 F (37 C) (Oral)   Resp 18   Ht 5' 1 (1.549 m)   Wt 164 lb (74.4 kg)   LMP 12/30/2023 (Exact Date)   SpO2 96%   BMI 30.99 kg/m   Visual Acuity Right Eye Distance:   Left Eye Distance:   Bilateral Distance:    Right Eye Near:   Left Eye Near:    Bilateral Near:     Physical Exam Vitals reviewed.  Constitutional:      General: She is awake. She is not in acute distress.    Appearance: Normal appearance. She is well-developed. She is not ill-appearing, toxic-appearing or diaphoretic.  HENT:     Head: Normocephalic.     Right Ear: Hearing normal.     Left Ear: Hearing normal.     Nose: Nose normal.     Mouth/Throat:     Mouth: Mucous membranes are moist.  Eyes:     General: Vision grossly intact.     Conjunctiva/sclera: Conjunctivae normal.  Cardiovascular:     Rate and Rhythm: Normal rate and regular rhythm.     Heart sounds: Normal heart sounds.  Pulmonary:     Effort: Pulmonary effort is normal.     Breath  sounds: Normal breath sounds and air entry.  Musculoskeletal:        General: Normal range of motion.     Cervical back: Normal range of motion and neck supple.  Skin:    General: Skin is warm and dry.  Neurological:     General: No focal deficit present.     Mental Status: She is alert and oriented to person, place, and time.  Psychiatric:        Speech: Speech normal.        Behavior: Behavior is cooperative.      UC Treatments / Results  Labs (all labs ordered are listed, but only abnormal results are displayed) Labs  Reviewed - No data to display  EKG   Radiology No results found.  Procedures Procedures (including critical care time)  Medications Ordered in UC Medications  metoCLOPramide  (REGLAN ) injection 10 mg (has no administration in time range)  ketorolac  (TORADOL ) 15 MG/ML injection 30 mg (has no administration in time range)    Initial Impression / Assessment and Plan / UC Course  I have reviewed the triage vital signs and the nursing notes.  Pertinent labs & imaging results that were available during my care of the patient were reviewed by me and considered in my medical decision making (see chart for details).     The patient was evaluated for acute headache without focal neurologic deficits, concerning vitals, or red-flag features on exam. She has a history of migraines and current presentation consistent with same. Symptomatic treatment was provided with a toradol  and reglan  injection in clinic. Nurtec and Zofran  prescriptions provided. Home measures were reviewed, including hydration, regular meals, minimizing caffeine  and alcohol, nicotine avoidance, rest in a dark quiet room during attacks, sleep hygiene, and relaxation strategies. The patient was advised to follow up with their primary care tomorrow morning as scheduled. Immediate emergency evaluation was recommended for thunderclap onset, rapidly worsening pain with exertion, repeated vomiting, fever with neck stiffness, visual changes, speech difficulty, unilateral weakness or numbness, imbalance, fainting, confusion, or seizure.  Today's evaluation has revealed no signs of a dangerous process. Discussed diagnosis with patient and/or guardian. Patient and/or guardian aware of their diagnosis, possible red flag symptoms to watch out for and need for close follow up. Patient and/or guardian understands verbal and written discharge instructions. Patient and/or guardian comfortable with plan and disposition.  Patient and/or guardian has a  clear mental status at this time, good insight into illness (after discussion and teaching) and has clear judgment to make decisions regarding their care  Documentation was completed with the aid of voice recognition software. Transcription may contain typographical errors.   Final Clinical Impressions(s) / UC Diagnoses   Final diagnoses:  Intractable migraine without aura and without status migrainosus     Discharge Instructions      You were seen today for a headache consistent with your typical migraine. You received injections in the clinic that should help relieve your symptoms. Use your prescription medication only as needed.  When you have a headache, try lying down in a dark, quiet room. Keep the lights dim if bright light bothers you or makes your headache worse. Eat meals regularly and stay hydrated by drinking enough fluids to keep your urine pale yellow. Try to reduce or stop your intake of caffeine , and limit alcohol. Avoid using any products that contain nicotine or tobacco. To help prevent future headaches, try relaxation techniques like massage or deep breathing. Manage stress, sit with good posture,  and avoid tensing your muscles. Make sure to get regular sleep--aim for 7 to 9 hours each night. Follow-up with your primary care provider tomorrow morning as scheduled. Go to the emergency room right away if your headache becomes very severe very quickly, gets worse with physical activity, or if you experience repeated vomiting, neck pain or stiffness, changes in your vision, pain in an eye or ear, trouble speaking, weakness or loss of muscle control, loss of balance or coordination, fainting, confusion, or a seizure.     ED Prescriptions     Medication Sig Dispense Auth. Provider   Rimegepant Sulfate (NURTEC) 75 MG TBDP Take 75 mg (1 tablet) by mouth as needed for migraine. Do not take more than 75 mg (1 tablet) in a 24-hour period 4 tablet India Jolin, [place], FNP   ondansetron   (ZOFRAN -ODT) 8 MG disintegrating tablet Take 1 tablet (8 mg total) by mouth every 8 (eight) hours as needed for nausea or vomiting. 12 tablet Iola Lukes, FNP      PDMP not reviewed this encounter.   Iola Lukes, FNP 01/22/24 1700

## 2024-01-22 NOTE — Telephone Encounter (Signed)
 Pt has upcoming appointment on 01/23/24.  Angie

## 2024-01-22 NOTE — Telephone Encounter (Signed)
 FYI: This call has been transferred to triage nurse: the Triage Nurse. Once the result note has been entered staff can address the message at that time.  Patient called in with the following symptoms:  Red Word:Headaches, she scheduled an appt with Rosina for 01/27/24, these are lasting for 3-4 days at a time, hot and throbbing & feeling nauseous.   Please advise at [facility] [phone]  Message is routed to Provider Pool.

## 2024-01-22 NOTE — Discharge Instructions (Signed)
 You were seen today for a headache consistent with your typical migraine. You received injections in the clinic that should help relieve your symptoms. Use your prescription medication only as needed.  When you have a headache, try lying down in a dark, quiet room. Keep the lights dim if bright light bothers you or makes your headache worse. Eat meals regularly and stay hydrated by drinking enough fluids to keep your urine pale yellow. Try to reduce or stop your intake of caffeine , and limit alcohol. Avoid using any products that contain nicotine or tobacco. To help prevent future headaches, try relaxation techniques like massage or deep breathing. Manage stress, sit with good posture, and avoid tensing your muscles. Make sure to get regular sleep--aim for 7 to 9 hours each night. Follow-up with your primary care provider tomorrow morning as scheduled. Go to the emergency room right away if your headache becomes very severe very quickly, gets worse with physical activity, or if you experience repeated vomiting, neck pain or stiffness, changes in your vision, pain in an eye or ear, trouble speaking, weakness or loss of muscle control, loss of balance or coordination, fainting, confusion, or a seizure.

## 2024-01-23 ENCOUNTER — Ambulatory Visit: Admitting: Nurse Practitioner

## 2024-01-27 ENCOUNTER — Ambulatory Visit: Admitting: Internal Medicine
